# Patient Record
Sex: Male | Born: 1935 | Marital: Married | Smoking: Never smoker
Health system: Southern US, Community
[De-identification: ages and names within clinical notes are randomized; demographics above are authoritative.]

## PROBLEM LIST (undated history)

## (undated) DIAGNOSIS — I10 Essential (primary) hypertension: Secondary | ICD-10-CM

## (undated) DIAGNOSIS — N200 Calculus of kidney: Secondary | ICD-10-CM

## (undated) HISTORY — PX: CARDIAC SURGERY: SHX584

---

## 2013-04-30 ENCOUNTER — Emergency Department: Payer: Self-pay | Admitting: Emergency Medicine

## 2013-04-30 LAB — URINALYSIS, COMPLETE
Bacteria: NONE SEEN
Bilirubin,UR: NEGATIVE
Glucose,UR: NEGATIVE mg/dL (ref 0–75)
Ketone: NEGATIVE
Nitrite: NEGATIVE
Ph: 5 (ref 4.5–8.0)
Protein: NEGATIVE
RBC,UR: 5 /HPF (ref 0–5)

## 2013-04-30 LAB — CBC
MCH: 29.6 pg (ref 26.0–34.0)
MCHC: 34.6 g/dL (ref 32.0–36.0)
Platelet: 389 10*3/uL (ref 150–440)
RBC: 5.28 10*6/uL (ref 4.40–5.90)

## 2013-04-30 LAB — COMPREHENSIVE METABOLIC PANEL
BUN: 12 mg/dL (ref 7–18)
Bilirubin,Total: 1.2 mg/dL — ABNORMAL HIGH (ref 0.2–1.0)
Calcium, Total: 9.6 mg/dL (ref 8.5–10.1)
Chloride: 101 mmol/L (ref 98–107)
Co2: 30 mmol/L (ref 21–32)
Creatinine: 0.89 mg/dL (ref 0.60–1.30)
EGFR (Non-African Amer.): >60
Glucose: 97 mg/dL (ref 65–99)
Osmolality: 272 (ref 275–301)
Potassium: 4 mmol/L (ref 3.5–5.1)
SGOT(AST): 28 U/L (ref 15–37)
SGPT (ALT): 27 U/L (ref 12–78)
Sodium: 136 mmol/L (ref 136–145)
Total Protein: 7.9 g/dL (ref 6.4–8.2)

## 2013-04-30 LAB — TROPONIN I: Troponin-I: <0.02 ng/mL

## 2013-04-30 LAB — CK TOTAL AND CKMB (NOT AT ARMC): CK-MB: <0.5 ng/mL — ABNORMAL LOW (ref 0.5–3.6)

## 2013-05-01 LAB — TROPONIN I: Troponin-I: <0.02 ng/mL

## 2013-05-01 IMAGING — CR DG CHEST 2V
1 series · 2 of 2 positions shown · non-contrast
Comparison: none

REASON FOR EXAM: chest pain eval
COMMENTS:

PROCEDURE:     DXR - DXR CHEST PA (OR AP) AND LATERAL  - [DATE]  [DATE]
RESULT:

[Series 1: w chest pa · 0.14mm/px · 2 of 2 slices shown]
[im 1/2]
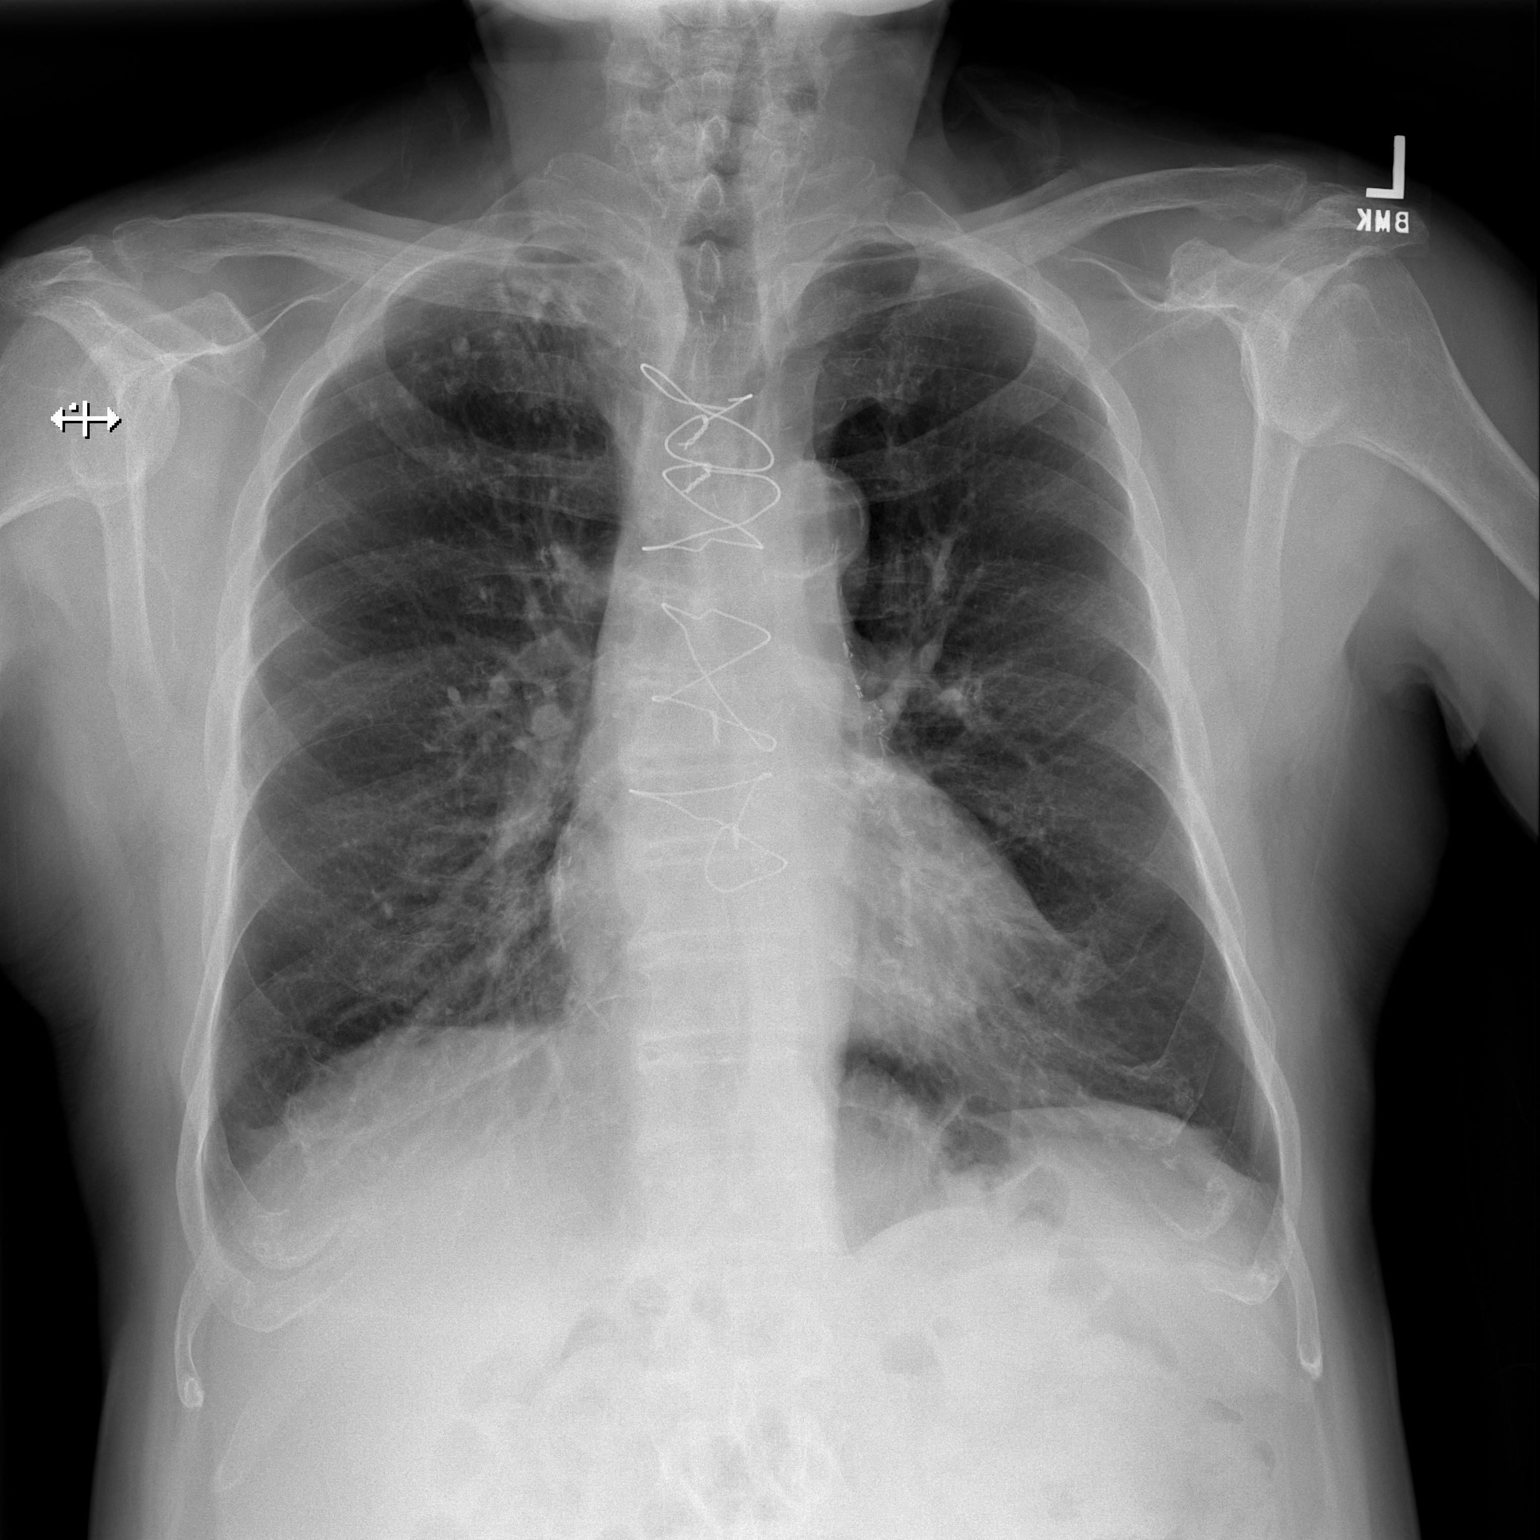
[im 2/2]
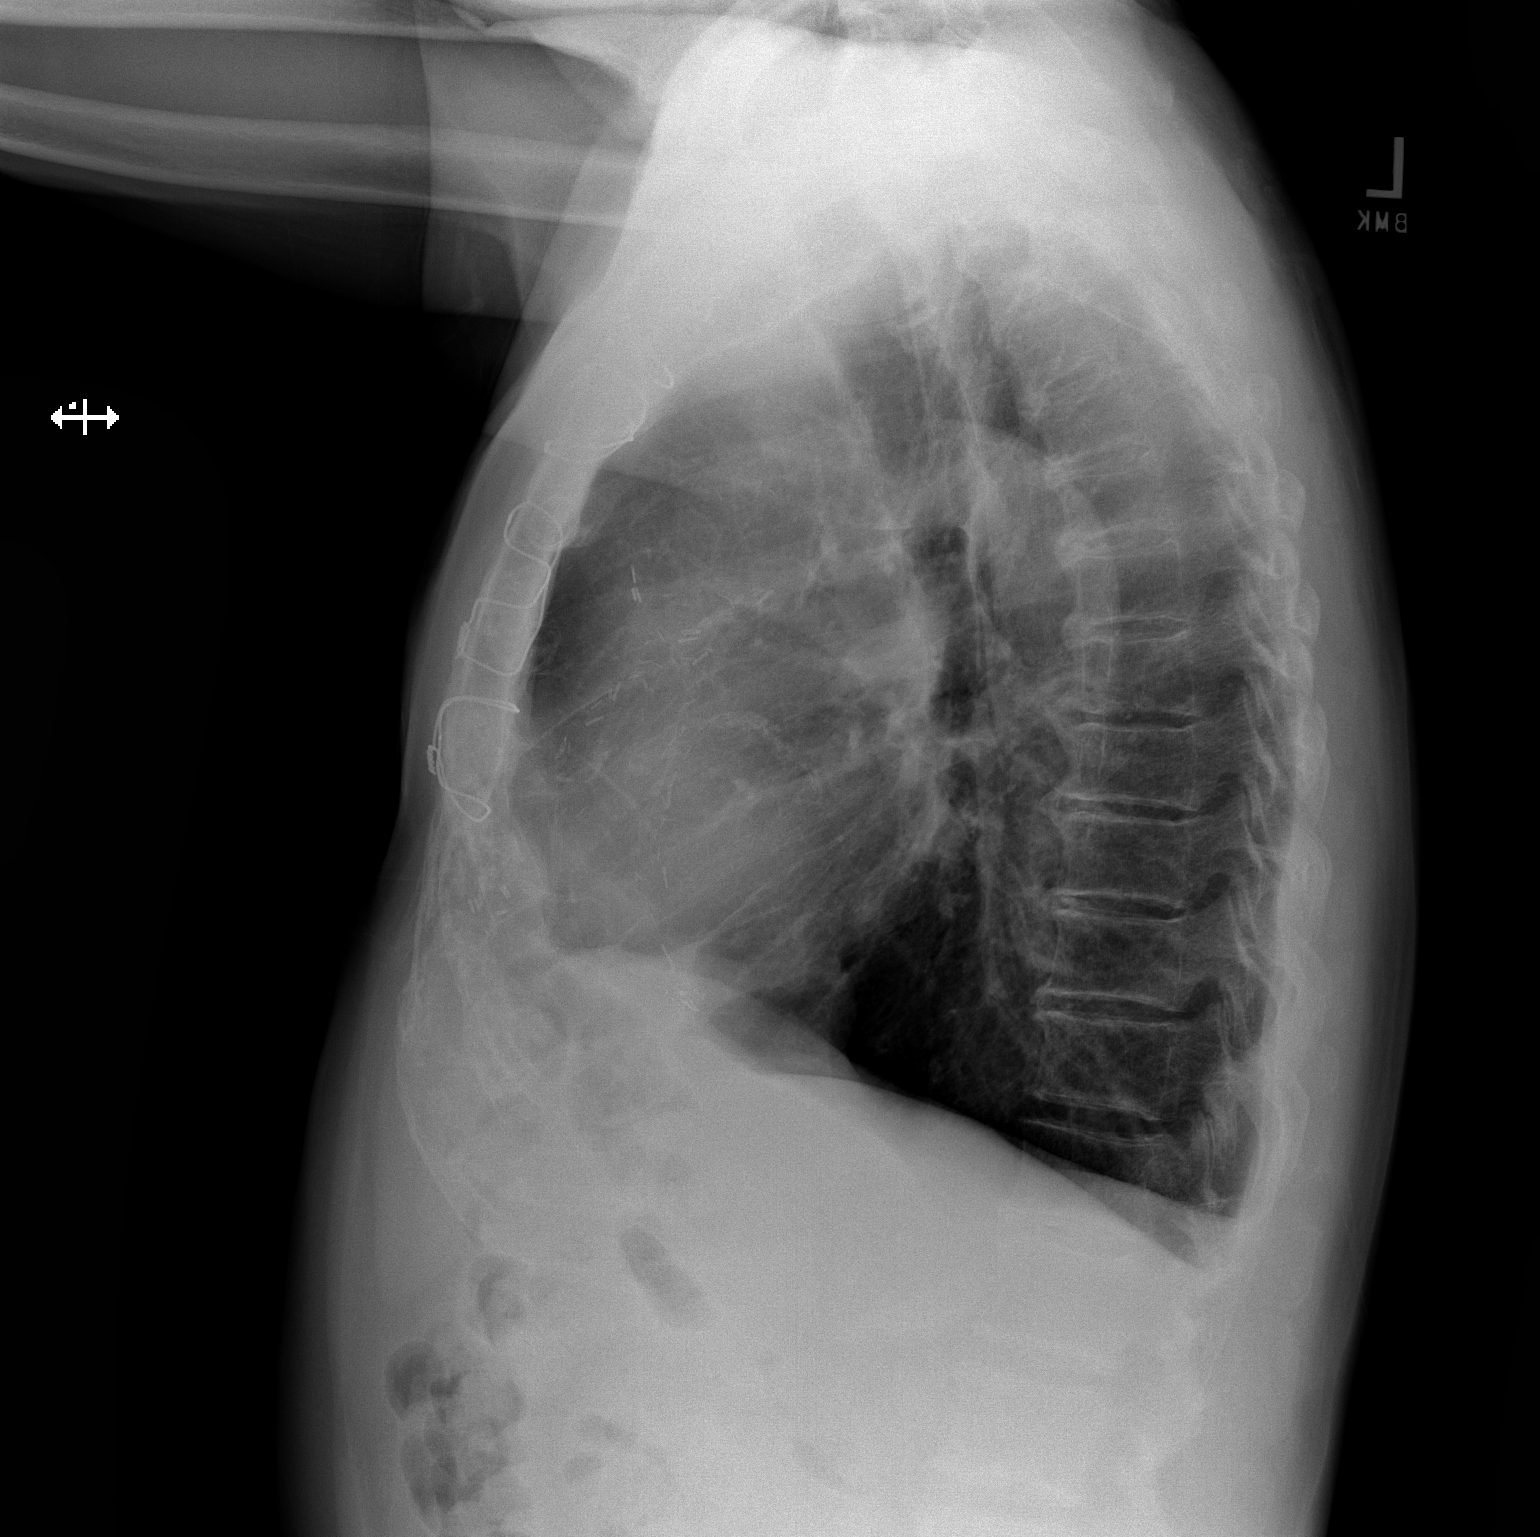

[2 of 2 positions shown; findings below may reference images not displayed]

FINDINGS: There is flattening of the hemidiaphragms. Prominence of the
interstitial marking is appreciated. No focal regions of consolidation are
identified. There is blunting of the costophrenic angles. The cardiac
silhouette is within normal limits. The visualized bony skeleton is
unremarkable. The patient is status post median sternotomy.
IMPRESSION: 1. COPD without evidence of acute cardiopulmonary disease.

## 2017-08-20 ENCOUNTER — Emergency Department
Admission: EM | Admit: 2017-08-20 | Discharge: 2017-08-20 | Disposition: A | Discharge status: Home / Self Care | Payer: Medicare Other | Attending: Emergency Medicine | Admitting: Emergency Medicine

## 2017-08-20 ENCOUNTER — Other Ambulatory Visit: Payer: Self-pay

## 2017-08-20 ENCOUNTER — Encounter: Payer: Self-pay | Admitting: Emergency Medicine

## 2017-08-20 ENCOUNTER — Emergency Department: Payer: Medicare Other

## 2017-08-20 DIAGNOSIS — R109 Unspecified abdominal pain: Secondary | ICD-10-CM | POA: Insufficient documentation

## 2017-08-20 DIAGNOSIS — I10 Essential (primary) hypertension: Secondary | ICD-10-CM | POA: Insufficient documentation

## 2017-08-20 HISTORY — DX: Essential (primary) hypertension: I10

## 2017-08-20 HISTORY — DX: Calculus of kidney: N20.0

## 2017-08-20 LAB — CBC
HEMATOCRIT: 40 % (ref 40.0–52.0)
Hemoglobin: 14.1 g/dL (ref 13.0–18.0)
MCH: 30 pg (ref 26.0–34.0)
MCHC: 35.2 g/dL (ref 32.0–36.0)
MCV: 85.3 fL (ref 80.0–100.0)
PLATELETS: 624 10*3/uL — AB (ref 150–440)
RBC: 4.68 MIL/uL (ref 4.40–5.90)
RDW: 19.6 % — ABNORMAL HIGH (ref 11.5–14.5)
WBC: 8.1 10*3/uL (ref 3.8–10.6)

## 2017-08-20 LAB — BASIC METABOLIC PANEL
Anion gap: 8 (ref 5–15)
BUN: 17 mg/dL (ref 6–20)
CHLORIDE: 101 mmol/L (ref 101–111)
CO2: 29 mmol/L (ref 22–32)
Calcium: 9.2 mg/dL (ref 8.9–10.3)
Creatinine, Ser: 1 mg/dL (ref 0.61–1.24)
Glucose, Bld: 103 mg/dL — ABNORMAL HIGH (ref 65–99)
POTASSIUM: 4.6 mmol/L (ref 3.5–5.1)
SODIUM: 138 mmol/L (ref 135–145)

## 2017-08-20 LAB — URINALYSIS, COMPLETE (UACMP) WITH MICROSCOPIC
Bacteria, UA: NONE SEEN
Bilirubin Urine: NEGATIVE
Glucose, UA: NEGATIVE mg/dL
Hgb urine dipstick: NEGATIVE
Ketones, ur: NEGATIVE mg/dL
Leukocytes, UA: NEGATIVE
Nitrite: NEGATIVE
Protein, ur: NEGATIVE mg/dL
SPECIFIC GRAVITY, URINE: 1.012 (ref 1.005–1.030)
SQUAMOUS EPITHELIAL / LPF: NONE SEEN
pH: 5 (ref 5.0–8.0)

## 2017-08-20 IMAGING — CT CT RENAL STONE PROTOCOL
2 of 4 series · 16 of 46 positions shown, 18 images · non-contrast
Comparison: None.

CLINICAL DATA: 87-year-old male with history of right flank pain

EXAM:
CT ABDOMEN AND PELVIS WITHOUT CONTRAST
TECHNIQUE: Multidetector CT imaging of the abdomen and pelvis was performed
following the standard protocol without IV contrast.

[Series 4: coronal · coronal · 0.74mm/px · 3 of 137 slices shown]
[im 46/137  soft-tissue]
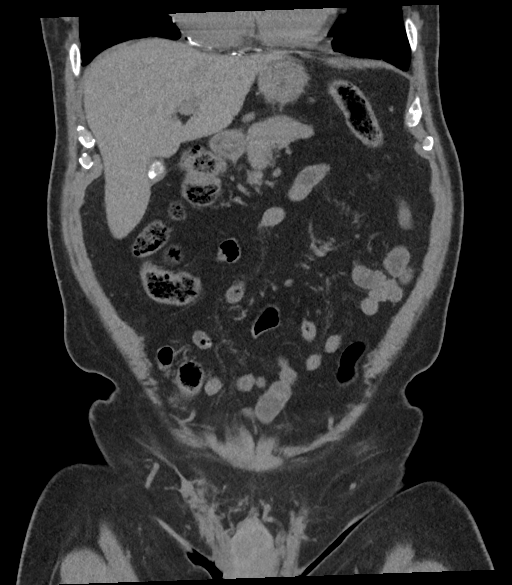
[im 61/137  soft-tissue]
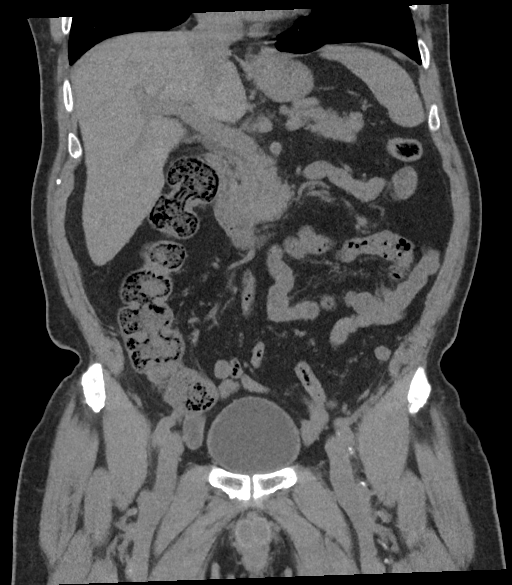
[im 76/137  soft-tissue]
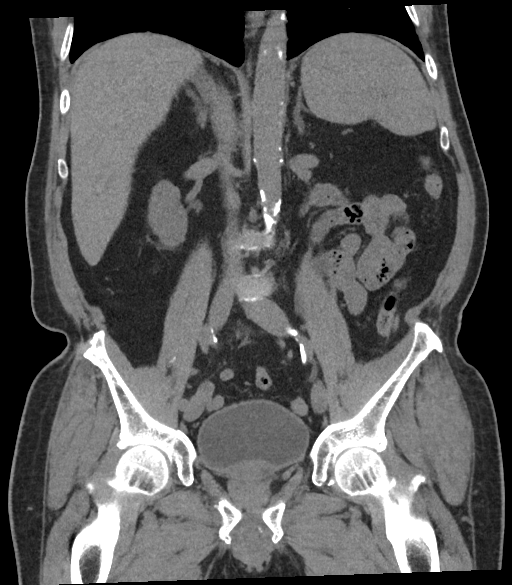

[Series 6: stone full standard · axial · 0.75mm/px · z∈[-916,-551]mm · 13 of 84 slices shown, 15 images]
[im 7/84  soft-tissue]
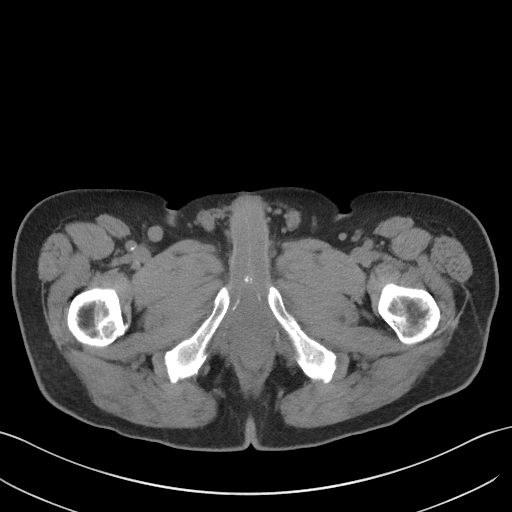
[im 7/84  bone]
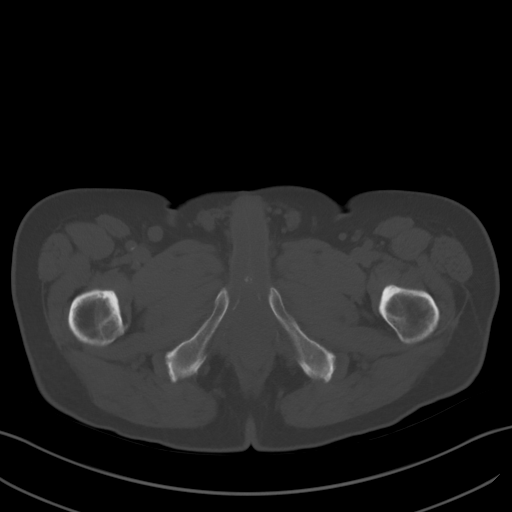
[im 13/84  soft-tissue]
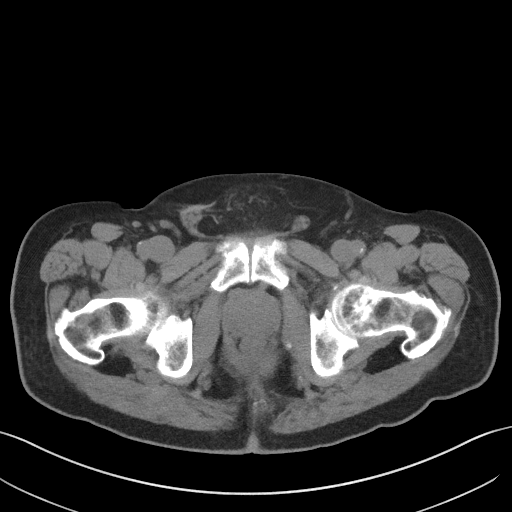
[im 19/84  soft-tissue]
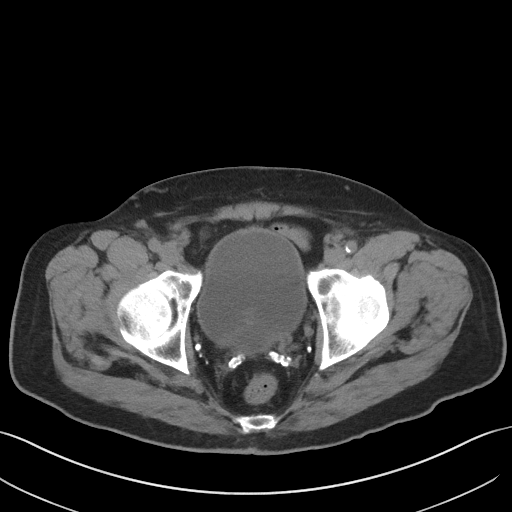
[im 25/84  soft-tissue]
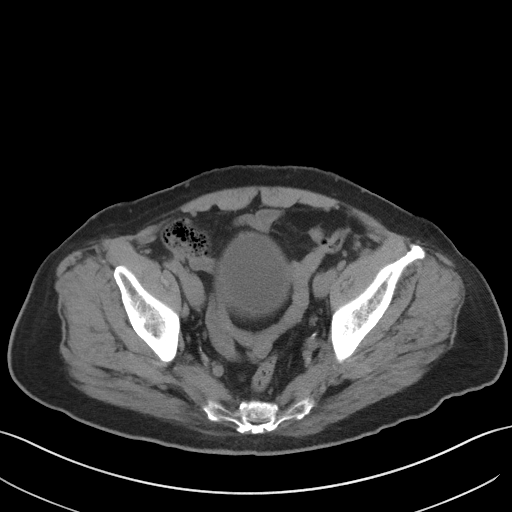
[im 31/84  soft-tissue]
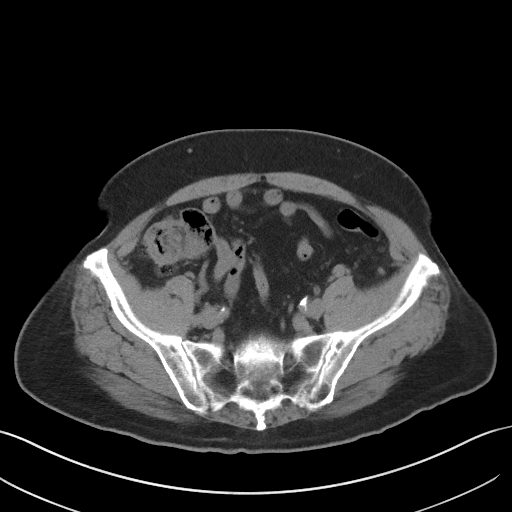
[im 37/84  soft-tissue]
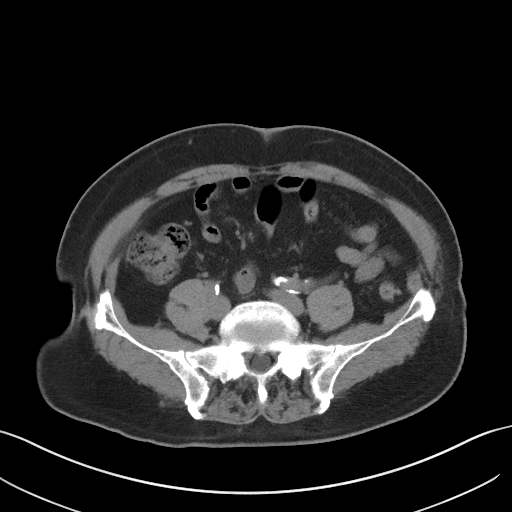
[im 44/84  soft-tissue]
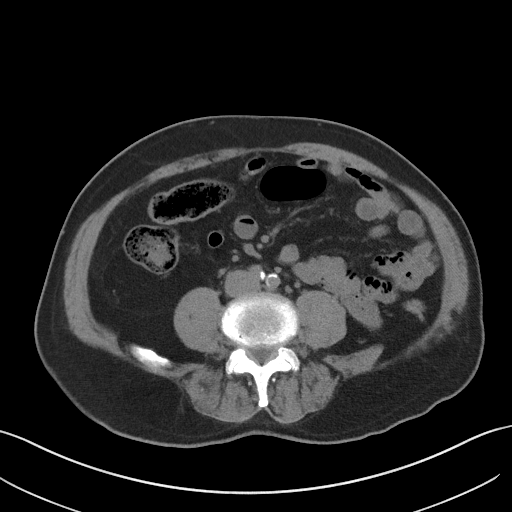
[im 50/84  soft-tissue]
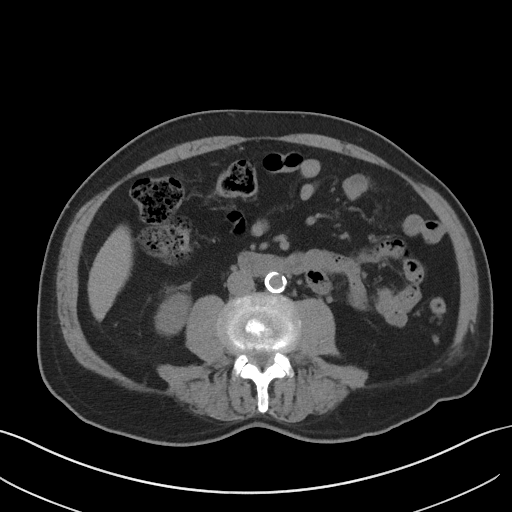
[im 56/84  soft-tissue]
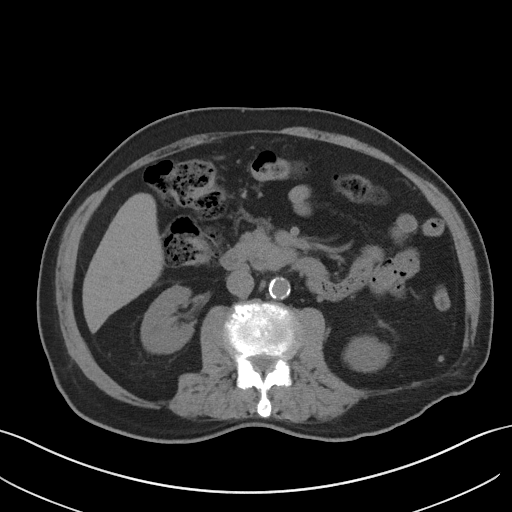
[im 56/84  bone]
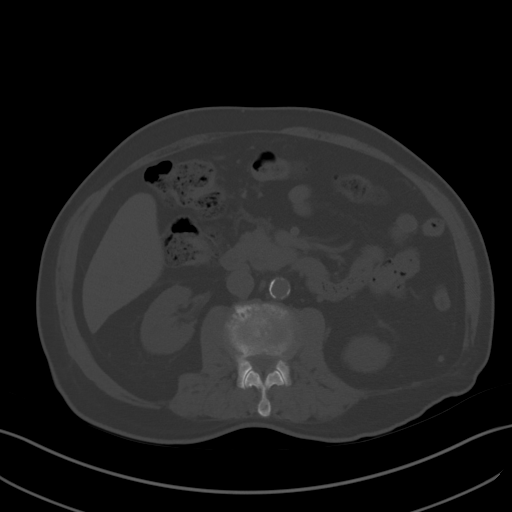
[im 62/84  soft-tissue]
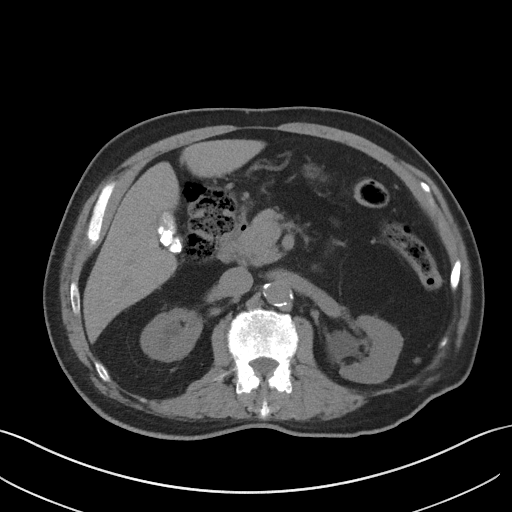
[im 68/84  soft-tissue]
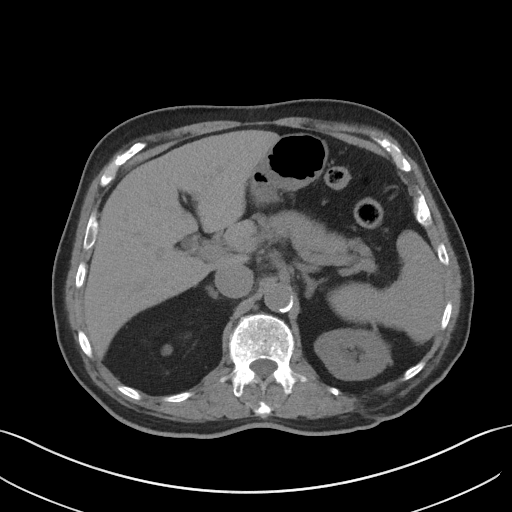
[im 74/84  soft-tissue]
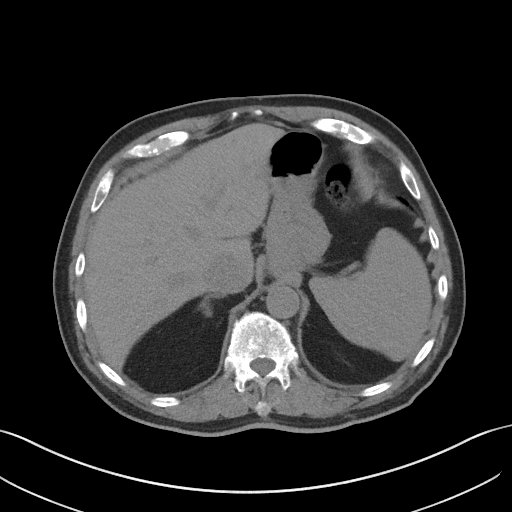
[im 80/84  soft-tissue]
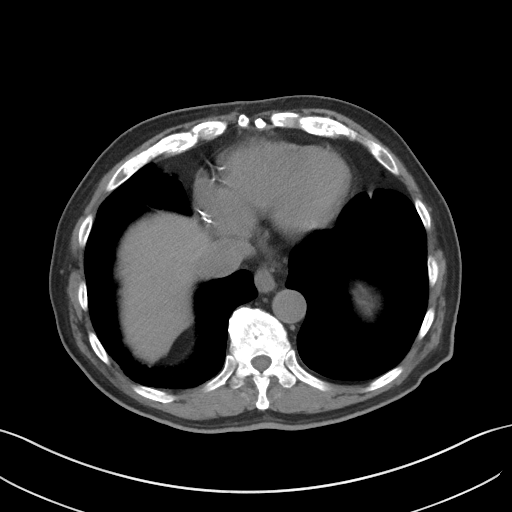

[16 of 46 positions shown; findings below may reference images not displayed]

FINDINGS: Lower chest: Surgical changes of prior median sternotomy.
Calcifications of the native coronary vasculature. Hiatal hernia. No
airspace disease of the lower lungs.

Hepatobiliary: Unremarkable appearance of liver parenchyma.

Cholelithiasis without associated inflammatory changes.

Pancreas: Unremarkable pancreas

Spleen: Unremarkable spleen

Adrenals/Urinary Tract: Unremarkable adrenal glands.

No evidence of right-sided hydronephrosis. Unremarkable course the
right ureter. Mixed density rounded lesion at the lateral cortex of
the right kidney with rim calcification measures 16 mm, incompletely
characterized. Low-density cystic lesions at the superior cortex of
the right kidney, incompletely characterized. No nephrolithiasis.

Left kidney demonstrates extrarenal pelvis. Unremarkable course of
the left ureter. No hydronephrosis or nephrolithiasis.

Urinary bladder partially distended.  No inflammatory changes.

Stomach/Bowel: Hiatal hernia. Unremarkable appearance of stomach.
Unremarkable small bowel with no abnormal distention. No transition
point.

Normal appendix. Mild a moderate stool burden. No inflammatory
changes involving the colon. Diverticular disease without evidence
of acute diverticulitis.

Vascular/Lymphatic: Calcifications of the abdominal aorta. No
aneurysm. Calcifications extend of bilateral iliac system.

No adenopathy.  No inflammatory changes within the abdomen.

Reproductive: Transverse diameter of the prostate measures 5.2 cm

Other: None

Musculoskeletal: No acute displaced fracture. Degenerative changes
of the lumbar spine. No bony canal narrowing.
IMPRESSION: No acute CT finding.

No evidence of hydronephrosis or nephrolithiasis.

Rounded lesion of the lateral cortex of the right kidney with rim
calcification. Additional low-density lesions at the superior cortex
of the right kidney. Although likely benign, none of these are
completely characterize, and consider referral for contrast-enhanced
MR, particularly if the patient has hematuria.

Cholelithiasis without evidence of acute cholecystitis.

Hiatal hernia.

Diverticular disease without evidence of acute diverticulitis.

Aortic vascular calcifications with evidence of native coronary
artery disease, and evidence of prior CABG. Aortic Atherosclerosis
([OL]-[OL]).

## 2017-08-20 MED ORDER — TRAMADOL HCL 50 MG PO TABS
50.0000 mg | ORAL_TABLET | Freq: Once | ORAL | Status: AC
  Administered 2017-08-20: 50 mg via ORAL
  Filled 2017-08-20: qty 1

## 2017-08-20 NOTE — ED Triage Notes (Addendum)
Pt here speaks minimal English but able to understand most questions asked in triage. Pt here with son from home, c/o right flank pain for the past few days, does not c/o pain with urination, has hx of kidneys stones. Pt states the pain feels the same. Pt took pain medication from [place] this morning. Pt states he had surgery about 40 years ago in [place] for kidney stones.  Denies any abdominal pain.

## 2017-08-20 NOTE — ED Notes (Addendum)
Pre reports right sided flank pain X 2 days. Worse with movement. Pt denies any changes in urination, abdominal pain, SOB or chest pain. No recent injuries or any strenuous activity. Hx of kidney stone a "long time ago". Pt alert and oriented X4, active, cooperative, pt in NAD. RR even and unlabored, color WNL.  Son at bedside.

## 2017-08-20 NOTE — ED Notes (Signed)
Pt alert and oriented X4, active, cooperative, pt in NAD. RR even and unlabored, color WNL.  Pt informed to return if any life threatening symptoms occur.  Discharge and followup instructions reviewed.  

## 2017-08-20 NOTE — ED Notes (Signed)
Pt back from CT

## 2017-08-20 NOTE — ED Provider Notes (Signed)
[facility] Emergency Department Provider Note  ____________________________________________  Time seen: Approximately 11:41 AM  I have reviewed the triage vital signs and the nursing notes.   HISTORY  Chief Complaint Flank Pain    HPI Clinton Gordon is a 84 y.o. male who complains of right flank pain for the past 2 days, nonradiating. Worse with movement, no alleviating factors. No dysuria frequency urgency or hematuria. No abdominal pain, no vomiting diarrhea constipation chest pain or shortness of breath. No fevers chills or sweats. Reminds him of the kidney stone he had on his left side many decades ago that required surgical intervention back in [place]. Pain is been constant, moderate intensity.     Past Medical History:  Diagnosis Date  . Hypertension   . Kidney stones      There are no active problems to display for this patient.    History reviewed. No pertinent surgical history.   Prior to Admission medications   Not on File     Allergies Patient has no known allergies.   History reviewed. No pertinent family history.  Social History Social History   Tobacco Use  . Smoking status: Never Smoker  . Smokeless tobacco: Never Used  Substance Use Topics  . Alcohol use: No    Frequency: Never  . Drug use: Not on file    Review of Systems  Constitutional:   No fever or chills.  ENT:   No sore throat. No rhinorrhea. Cardiovascular:   No chest pain or syncope. Respiratory:   No dyspnea or cough. Gastrointestinal:   Negative for abdominal pain, vomiting and diarrhea.  Musculoskeletal:   Right lower back pain as above All other systems reviewed and are negative except as documented above in ROS and HPI.  ____________________________________________   PHYSICAL EXAM:  VITAL SIGNS: ED Triage Vitals  Enc Vitals Group     BP 08/20/17 0742 134/73     Pulse Rate 08/20/17 0742 68     Resp 08/20/17 0742 16     Temp 08/20/17  0742 98 F (36.7 C)     Temp Source 08/20/17 0742 Oral     SpO2 08/20/17 0742 98 %     Weight 08/20/17 0742 145 lb (65.8 kg)     Height 08/20/17 0742 5\' 7"  (1.702 m)     Head Circumference --      Peak Flow --      Pain Score 08/20/17 0747 8     Pain Loc --      Pain Edu? --      Excl. in [place]? --     Vital signs reviewed, nursing assessments reviewed.   Constitutional:   Alert and oriented. Well appearing and in no distress. Eyes:   No scleral icterus.  EOMI. No nystagmus. No conjunctival pallor. PERRL. ENT   Head:   Normocephalic and atraumatic.   Nose:   No congestion/rhinnorhea.    Mouth/Throat:   MMM, no pharyngeal erythema. No peritonsillar mass.    Neck:   No meningismus. Full ROM. Hematological/Lymphatic/Immunilogical:   No cervical lymphadenopathy. Cardiovascular:   RRR. Symmetric bilateral radial and DP pulses.  No murmurs.  Respiratory:   Normal respiratory effort without tachypnea/retractions. Breath sounds are clear and equal bilaterally. No wheezes/rales/rhonchi. Gastrointestinal:   Soft and nontender. Non distended. There is no CVA tenderness.  No rebound, rigidity, or guarding. Genitourinary:   deferred Musculoskeletal:   Normal range of motion in all extremities. No joint effusions.  No lower extremity  tenderness.  No edema. No lower back tenderness. No midline spinal tenderness. Neurologic:   Normal speech and language.  Motor grossly intact. No gross focal neurologic deficits are appreciated.  Skin:    Skin is warm, dry and intact. No rash noted.  No petechiae, purpura, or bullae.  ____________________________________________    LABS (pertinent positives/negatives) (all labs ordered are listed, but only abnormal results are displayed) Labs Reviewed  URINALYSIS, COMPLETE (UACMP) WITH MICROSCOPIC - Abnormal; Notable for the following components:      Result Value   Color, Urine YELLOW (*)    APPearance CLEAR (*)    All other components within  normal limits  BASIC METABOLIC PANEL - Abnormal; Notable for the following components:   Glucose, Bld 103 (*)    All other components within normal limits  CBC - Abnormal; Notable for the following components:   RDW 19.6 (*)    Platelets 624 (*)    All other components within normal limits   ____________________________________________   EKG    ____________________________________________    RADIOLOGY  Ct Renal Stone Study  Result Date: 08/20/2017 CLINICAL DATA:  84 year old male with history of right flank pain EXAM: CT ABDOMEN AND PELVIS WITHOUT CONTRAST TECHNIQUE: Multidetector CT imaging of the abdomen and pelvis was performed following the standard protocol without IV contrast. COMPARISON:  None. FINDINGS: Lower chest: Surgical changes of prior median sternotomy. Calcifications of the native coronary vasculature. Hiatal hernia. No airspace disease of the lower lungs. Hepatobiliary: Unremarkable appearance of liver parenchyma. Cholelithiasis without associated inflammatory changes. Pancreas: Unremarkable pancreas Spleen: Unremarkable spleen Adrenals/Urinary Tract: Unremarkable adrenal glands. No evidence of right-sided hydronephrosis. Unremarkable course the right ureter. Mixed density rounded lesion at the lateral cortex of the right kidney with rim calcification measures 16 mm, incompletely characterized. Low-density cystic lesions at the superior cortex of the right kidney, incompletely characterized. No nephrolithiasis. Left kidney demonstrates extrarenal pelvis. Unremarkable course of the left ureter. No hydronephrosis or nephrolithiasis. Urinary bladder partially distended.  No inflammatory changes. Stomach/Bowel: Hiatal hernia. Unremarkable appearance of stomach. Unremarkable small bowel with no abnormal distention. No transition point. Normal appendix. Mild a moderate stool burden. No inflammatory changes involving the colon. Diverticular disease without evidence of acute  diverticulitis. Vascular/Lymphatic: Calcifications of the abdominal aorta. No aneurysm. Calcifications extend of bilateral iliac system. No adenopathy.  No inflammatory changes within the abdomen. Reproductive: Transverse diameter of the prostate measures 5.2 cm Other: None Musculoskeletal: No acute displaced fracture. Degenerative changes of the lumbar spine. No bony canal narrowing. IMPRESSION: No acute CT finding. No evidence of hydronephrosis or nephrolithiasis. Rounded lesion of the lateral cortex of the right kidney with rim calcification. Additional low-density lesions at the superior cortex of the right kidney. Although likely benign, none of these are completely characterize, and consider referral for contrast-enhanced MR, particularly if the patient has hematuria. Cholelithiasis without evidence of acute cholecystitis. Hiatal hernia. Diverticular disease without evidence of acute diverticulitis. Aortic vascular calcifications with evidence of native coronary artery disease, and evidence of prior CABG. Aortic Atherosclerosis (ICD10-I70.0). Electronically Signed   By: Corrie Mckusick D.O.   On: 08/20/2017 09:53    ____________________________________________   PROCEDURES Procedures  ____________________________________________   DIFFERENTIAL DIAGNOSIS  Ureterolithiasis, cystitis, appendicitis, bowel obstruction  CLINICAL IMPRESSION / ASSESSMENT AND PLAN / ED COURSE  Pertinent labs & imaging results that were available during my care of the patient were reviewed by me and considered in my medical decision making (see chart for details).   Patient well-appearing no acute  distress, unremarkable vital signs, overall reassuring exam and due to his age there is an elevated risk of occult pathology. Urinalysis was totally normal. Serum labs normal. No evidence of urinary tract infection.  Platelets are somewhat elevated, suspicious for an inflammatory state although his white blood cell count is  normal. Proceeded with CT scan to evaluate for kidney stone versus abdominal pathology, imaging was unremarkable without evidence of ureteral obstruction or appendicitis. Low suspicion for any vascular pathology. On reassessment patient feels better and wishes to proceed with outpatient follow-up with his primary care doctor. Recommended ibuprofen and Tylenol as needed for pain as well as a heating pad and continue symptom monitoring. Return precautions for any worsening of condition.Considering the patient's symptoms, medical history, and physical examination today, I have low suspicion for cholecystitis or biliary pathology, pancreatitis, perforation or bowel obstruction, hernia, intra-abdominal abscess, AAA or dissection, volvulus or intussusception, mesenteric ischemia, or appendicitis.        ____________________________________________   FINAL CLINICAL IMPRESSION(S) / ED DIAGNOSES    Final diagnoses:  Right flank pain      This SmartLink is deprecated. Use AVSMEDLIST instead to display the medication list for a patient.   Portions of this note were generated with dragon dictation software. Dictation errors may occur despite best attempts at proofreading.    Carrie Mew, MD 08/20/17 1143

## 2017-08-20 NOTE — ED Notes (Signed)
Patient transported to CT 

## 2017-08-20 NOTE — Discharge Instructions (Signed)
Your blood tests, urine test, and CT scan of the abdomen do not show any acute issues. Follow up with your doctor for continued monitoring of your symptoms.

## 2019-07-19 ENCOUNTER — Other Ambulatory Visit: Payer: Self-pay

## 2019-07-19 DIAGNOSIS — Z20822 Contact with and (suspected) exposure to covid-19: Secondary | ICD-10-CM

## 2019-07-20 LAB — NOVEL CORONAVIRUS, NAA: SARS-CoV-2, NAA: NOT DETECTED

## 2020-01-10 ENCOUNTER — Encounter: Payer: Self-pay | Admitting: Internal Medicine

## 2020-01-10 ENCOUNTER — Inpatient Hospital Stay: Payer: Medicare Other | Attending: Internal Medicine | Admitting: Internal Medicine

## 2020-01-10 ENCOUNTER — Other Ambulatory Visit: Payer: Self-pay

## 2020-01-10 ENCOUNTER — Inpatient Hospital Stay: Payer: Medicare Other

## 2020-01-10 DIAGNOSIS — Z7982 Long term (current) use of aspirin: Secondary | ICD-10-CM | POA: Insufficient documentation

## 2020-01-10 DIAGNOSIS — I10 Essential (primary) hypertension: Secondary | ICD-10-CM | POA: Insufficient documentation

## 2020-01-10 DIAGNOSIS — Z87442 Personal history of urinary calculi: Secondary | ICD-10-CM | POA: Insufficient documentation

## 2020-01-10 DIAGNOSIS — D473 Essential (hemorrhagic) thrombocythemia: Secondary | ICD-10-CM | POA: Insufficient documentation

## 2020-01-10 DIAGNOSIS — D75839 Thrombocytosis, unspecified: Secondary | ICD-10-CM

## 2020-01-10 DIAGNOSIS — Z79899 Other long term (current) drug therapy: Secondary | ICD-10-CM | POA: Diagnosis not present

## 2020-01-10 LAB — CBC WITH DIFFERENTIAL/PLATELET
Abs Immature Granulocytes: 0.14 10*3/uL — ABNORMAL HIGH (ref 0.00–0.07)
Basophils Absolute: 0.1 10*3/uL (ref 0.0–0.1)
Basophils Relative: 1 %
Eosinophils Absolute: 0.3 10*3/uL (ref 0.0–0.5)
Eosinophils Relative: 3 %
HCT: 38 % — ABNORMAL LOW (ref 39.0–52.0)
Hemoglobin: 12.2 g/dL — ABNORMAL LOW (ref 13.0–17.0)
Immature Granulocytes: 1 %
Lymphocytes Relative: 11 %
Lymphs Abs: 1.1 10*3/uL (ref 0.7–4.0)
MCH: 29 pg (ref 26.0–34.0)
MCHC: 32.1 g/dL (ref 30.0–36.0)
MCV: 90.3 fL (ref 80.0–100.0)
Monocytes Absolute: 1.4 10*3/uL — ABNORMAL HIGH (ref 0.1–1.0)
Monocytes Relative: 14 %
Neutro Abs: 7 10*3/uL (ref 1.7–7.7)
Neutrophils Relative %: 70 %
Platelets: 651 10*3/uL — ABNORMAL HIGH (ref 150–400)
RBC: 4.21 MIL/uL — ABNORMAL LOW (ref 4.22–5.81)
RDW: 18.1 % — ABNORMAL HIGH (ref 11.5–15.5)
WBC: 10 10*3/uL (ref 4.0–10.5)
nRBC: 0.7 % — ABNORMAL HIGH (ref 0.0–0.2)

## 2020-01-10 LAB — LACTATE DEHYDROGENASE: LDH: 418 U/L — ABNORMAL HIGH (ref 98–192)

## 2020-01-10 NOTE — Assessment & Plan Note (Addendum)
#  Thrombocytosis-platelets 600-700; range since 2017; fairly normal hemoglobin; white count.  Suspect primary bone marrow process like essential thrombocytosis; clinically less likely chronic myeloid leukemia.   #Patient is currently asymptomatic; however patient will be high risk based on patient age-would recommend Hydrea if diagnosed with essential thrombocytosis.  Also discussed the possible need for bone marrow biopsy if above work-up is inconclusive.  #Recommend checking; CBC LDH; MPL; CAL R; JAK2 mutations; review of peripheral smear.  Check BCR ABL FISH.  Thank you Dr.Hande for allowing me to participate in the care of your pleasant patient. Please do not hesitate to contact me with questions or concerns in the interim.  # DISPOSITION: # labs today- ordered- # follow up in ~ 2 weeks- MD;- No labs- Dr.B

## 2020-01-10 NOTE — Progress Notes (Signed)
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

## 2020-01-10 NOTE — Progress Notes (Addendum)
[name]  Patient Care Team: Tracie Harrier, MD as PCP - General (Internal Medicine)  CHIEF COMPLAINTS/PURPOSE OF CONSULTATION: Thrombocytosis  HEMATOLOGY HISTORY   # THROMBOCYTOSIS [platelets-600-700 since 2017 ; Hb-13; white count- 12]; 2019- CT-NEG for spleneomegaly.   # HTN; CAD- S/p CABG 2003; BPH    HISTORY OF PRESENTING ILLNESS:  Clinton Gordon 86 y.o.  male pleasant patient was been referred to [place] for further evaluation of elevated platelets which was incidentally found on blood work.   Patient denies any tingling or numbness in extremities.  Denies any burning sensation hand and feet.  Patient denies any history of blood clots or strokes. Denies any burning pain or discoloration in the fingertips or toes.   Appetite is good and weight loss or night sweats no recurrent fevers. No cough or shortness of breath or chest pain.     Review of Systems  Constitutional: Negative for chills, diaphoresis, fever, malaise/fatigue and weight loss.  HENT: Negative for nosebleeds and sore throat.   Eyes: Negative for double vision.  Respiratory: Negative for cough, hemoptysis, sputum production, shortness of breath and wheezing.   Cardiovascular: Negative for chest pain, palpitations, orthopnea and leg swelling.  Gastrointestinal: Negative for abdominal pain, blood in stool, constipation, diarrhea, heartburn, melena, nausea and vomiting.  Genitourinary: Negative for dysuria, frequency and urgency.  Musculoskeletal: Negative for back pain and joint pain.  Skin: Negative.  Negative for itching and rash.  Neurological: Negative for dizziness, tingling, focal weakness, weakness and headaches.  Endo/Heme/Allergies: Does not bruise/bleed easily.  Psychiatric/Behavioral: Negative for depression. The patient is not nervous/anxious and does not have insomnia.      MEDICAL HISTORY:  Past Medical History:  Diagnosis Date  . Hypertension   . Kidney stones      SURGICAL HISTORY: History reviewed. No pertinent surgical history.  SOCIAL HISTORY: Social History   Socioeconomic History  . Marital status: Married    Spouse name: Not on file  . Number of children: Not on file  . Years of education: Not on file  . Highest education level: Not on file  Occupational History  . Not on file  Tobacco Use  . Smoking status: Never Smoker  . Smokeless tobacco: Never Used  Substance and Sexual Activity  . Alcohol use: No  . Drug use: Not on file  . Sexual activity: Not on file  Other Topics Concern  . Not on file  Social History Narrative   Retd. 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    Social Determinants of Health   Financial Resource Strain:   . Difficulty of Paying Living Expenses:   Food Insecurity:   . Worried About Charity fundraiser in the Last Year:   . Arboriculturist in the Last Year:   Transportation Needs:   . Film/video editor (Medical):   Marland Kitchen Lack of Transportation (Non-Medical):   Physical Activity:   . Days of Exercise per Week:   . Minutes of Exercise per Session:   Stress:   . Feeling of Stress :   Social Connections:   . Frequency of Communication with Friends and Family:   . Frequency of Social Gatherings with Friends and Family:   . Attends Religious Services:   . Active Member of Clubs or Organizations:   . Attends Archivist Meetings:   Marland Kitchen Marital Status:   Intimate Partner Violence:   . Fear of Current  or Ex-Partner:   . Emotionally Abused:   Marland Kitchen Physically Abused:   . Sexually Abused:     FAMILY HISTORY: History reviewed. No pertinent family history.  ALLERGIES:  has No Known Allergies.  MEDICATIONS:  Current Outpatient Medications  Medication Sig Dispense Refill  . aspirin 81 MG EC tablet Take by mouth.    Marland Kitchen atorvastatin (LIPITOR) 20 MG tablet Take by mouth.    . isosorbide mononitrate (IMDUR) 30 MG 24 hr tablet Take by mouth.    .  losartan (COZAAR) 25 MG tablet Take by mouth.    . metoprolol succinate (TOPROL-XL) 25 MG 24 hr tablet Take by mouth.     No current facility-administered medications for this visit.     PHYSICAL EXAMINATION:   Vitals:   01/10/20 1436  BP: (!) 148/72  Pulse: 76  Resp: 18  Temp: (!) 97 F (36.1 C)  SpO2: 100%   Filed Weights   01/10/20 1436  Weight: 148 lb (67.1 kg)    Physical Exam  Constitutional: He is oriented to person, place, and time and well-developed, well-nourished, and in no distress.  Accompanied by son.  Walking independently.  HENT:  Head: Normocephalic and atraumatic.  Mouth/Throat: Oropharynx is clear and moist. No oropharyngeal exudate.  Eyes: Pupils are equal, round, and reactive to light.  Cardiovascular: Normal rate and regular rhythm.  Pulmonary/Chest: Effort normal and breath sounds normal. No respiratory distress. He has no wheezes.  Abdominal: Soft. Bowel sounds are normal. He exhibits no distension and no mass. There is no abdominal tenderness. There is no rebound and no guarding.  Musculoskeletal:        General: No tenderness or edema. Normal range of motion.     Cervical back: Normal range of motion and neck supple.  Neurological: He is alert and oriented to person, place, and time.  Skin: Skin is warm.  Psychiatric: Affect normal.     LABORATORY DATA:  I have reviewed the data as listed Lab Results  Component Value Date   WBC 10.0 01/10/2020   HGB 12.2 (L) 01/10/2020   HCT 38.0 (L) 01/10/2020   MCV 90.3 01/10/2020   PLT 651 (H) 01/10/2020   No results for input(s): NA, K, CL, CO2, GLUCOSE, BUN, CREATININE, CALCIUM, GFRNONAA, GFRAA, PROT, ALBUMIN, AST, ALT, ALKPHOS, BILITOT, BILIDIR, IBILI in the last 8760 hours.   No results found.  ASSESSMENT & PLAN:   Thrombocytosis ([place]) #Thrombocytosis-platelets 600-700; range since 2017; fairly normal hemoglobin; white count.  Suspect primary bone marrow process like essential  thrombocytosis; clinically less likely chronic myeloid leukemia.   #Patient is currently asymptomatic; however patient will be high risk based on patient age-would recommend Hydrea if diagnosed with essential thrombocytosis.  Also discussed the possible need for bone marrow biopsy if above work-up is inconclusive.  #Recommend checking; CBC LDH; MPL; CAL R; JAK2 mutations; review of peripheral smear.  Check BCR ABL FISH.  Thank you Dr.Hande for allowing me to participate in the care of your pleasant patient. Please do not hesitate to contact me with questions or concerns in the interim.  # DISPOSITION: # labs today- ordered- # follow up in ~ 2 weeks- MD;- No labs- Dr.B    Cammie Sickle, MD 01/11/2020 8:16 AM

## 2020-01-11 LAB — PATHOLOGIST SMEAR REVIEW

## 2020-01-13 LAB — JAK2 GENOTYPR

## 2020-01-14 LAB — BCR-ABL1 FISH
Cells Analyzed: 200
Cells Counted: 200

## 2020-01-14 LAB — MPL MUTATION ANALYSIS

## 2020-01-18 LAB — CALRETICULIN (CALR) MUTATION ANALYSIS

## 2020-01-25 ENCOUNTER — Other Ambulatory Visit: Payer: Self-pay

## 2020-01-25 ENCOUNTER — Encounter: Payer: Self-pay | Admitting: Internal Medicine

## 2020-01-25 ENCOUNTER — Inpatient Hospital Stay: Payer: Medicare Other | Attending: Internal Medicine | Admitting: Internal Medicine

## 2020-01-25 DIAGNOSIS — Z87442 Personal history of urinary calculi: Secondary | ICD-10-CM | POA: Diagnosis not present

## 2020-01-25 DIAGNOSIS — Z79899 Other long term (current) drug therapy: Secondary | ICD-10-CM | POA: Insufficient documentation

## 2020-01-25 DIAGNOSIS — Z7982 Long term (current) use of aspirin: Secondary | ICD-10-CM | POA: Diagnosis not present

## 2020-01-25 DIAGNOSIS — I10 Essential (primary) hypertension: Secondary | ICD-10-CM | POA: Insufficient documentation

## 2020-01-25 DIAGNOSIS — D473 Essential (hemorrhagic) thrombocythemia: Secondary | ICD-10-CM | POA: Insufficient documentation

## 2020-01-25 MED ORDER — HYDROXYUREA 500 MG PO CAPS: 500.0000 mg | ORAL_CAPSULE | Freq: Two times a day (BID) | ORAL | 3 refills | Status: DC

## 2020-01-25 NOTE — Assessment & Plan Note (Addendum)
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  High risk for thrombosis-given age/JAK2 mutation.   #Patient is currently asymptomatic; however patient will be high risk based on patient age; I would recommend Hydrea.  I discussed the possibility of myelofibrosis-however this would need further work-up including a bone marrow biopsy; ultrasound to rule out any splenomegaly.  Clinically based upon patient's lack of significant symptoms; and at least elevated platelets for 3 to 4 years I suspect it is essential thrombocytosis.  Patient declines bone marrow biopsy; which is reasonable at this time.   # Recommend Hydrea to control blood counts. I discussed the potential adverse effects including but not limited to nausea vomiting diarrhea; skin rash/lower extremity ulcerations.  Would recommend close monitoring of blood counts given the risk of bone marrow suppression.  # DISPOSITION: # cbc in 2 weeks- [place]; needs requisition # follow up in 12 th week of july- MD;- cbc/cmp/LDH- Dr.B  Cc; Dr.Hande

## 2020-01-25 NOTE — Progress Notes (Signed)
Pt and son in for follow up and lab results.

## 2020-01-27 NOTE — Progress Notes (Signed)
[place] NOTE  Patient Care Team: Tracie Harrier, MD as PCP - General (Internal Medicine)  CHIEF COMPLAINTS/PURPOSE OF CONSULTATION: Thrombocytosis  HEMATOLOGY HISTORY   Oncology History Overview Note  # ESSENTIAL THROMBOCYTOSIS [platelets-600-700 since 2017 ; Hb-13; white count- 12]; 2019- CT-NEG for splenomegaly; June 2021-JAK-2 positive; Hydrea 500 mg BID  # HTN; CAD- S/p CABG 2003; BPH   Essential thrombocythemia ([place])  01/10/2020 Initial Diagnosis   Essential thrombocythemia ([place])    HISTORY OF PRESENTING ILLNESS:  Clinton Gordon 86 y.o.  male with elevated platelets is here to follow-up with regards to his blood work ordered last visit.  Patient continues to deny any tingling or numbness in extremities.  Denies any burning sensation.   Review of Systems  Constitutional: Negative for chills, diaphoresis, fever, malaise/fatigue and weight loss.  HENT: Negative for nosebleeds and sore throat.   Eyes: Negative for double vision.  Respiratory: Negative for cough, hemoptysis, sputum production, shortness of breath and wheezing.   Cardiovascular: Negative for chest pain, palpitations, orthopnea and leg swelling.  Gastrointestinal: Negative for abdominal pain, blood in stool, constipation, diarrhea, heartburn, melena, nausea and vomiting.  Genitourinary: Negative for dysuria, frequency and urgency.  Musculoskeletal: Negative for back pain and joint pain.  Skin: Negative.  Negative for itching and rash.  Neurological: Negative for dizziness, tingling, focal weakness, weakness and headaches.  Endo/Heme/Allergies: Does not bruise/bleed easily.  Psychiatric/Behavioral: Negative for depression. The patient is not nervous/anxious and does not have insomnia.      MEDICAL HISTORY:  Past Medical History:  Diagnosis Date  . Hypertension   . Kidney stones     SURGICAL HISTORY: History reviewed. No pertinent surgical history.  SOCIAL HISTORY: Social  History   Socioeconomic History  . Marital status: Married    Spouse name: Not on file  . Number of children: Not on file  . Years of education: Not on file  . Highest education level: Not on file  Occupational History  . Not on file  Tobacco Use  . Smoking status: Never Smoker  . Smokeless tobacco: Never Used  Vaping Use  . Vaping Use: Never used  Substance and Sexual Activity  . Alcohol use: No  . Drug use: Not on file  . Sexual activity: Not on file  Other Topics Concern  . Not on file  Social History Narrative   Retd. 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    Social Determinants of Health   Financial Resource Strain:   . Difficulty of Paying Living Expenses:   Food Insecurity:   . Worried About Charity fundraiser in the Last Year:   . Arboriculturist in the Last Year:   Transportation Needs:   . Film/video editor (Medical):   Marland Kitchen Lack of Transportation (Non-Medical):   Physical Activity:   . Days of Exercise per Week:   . Minutes of Exercise per Session:   Stress:   . Feeling of Stress :   Social Connections:   . Frequency of Communication with Friends and Family:   . Frequency of Social Gatherings with Friends and Family:   . Attends Religious Services:   . Active Member of Clubs or Organizations:   . Attends Archivist Meetings:   Marland Kitchen Marital Status:   Intimate Partner Violence:   . Fear of Current or Ex-Partner:   . Emotionally Abused:   Marland Kitchen Physically Abused:   . Sexually Abused:  FAMILY HISTORY: History reviewed. No pertinent family history.  ALLERGIES:  has No Known Allergies.  MEDICATIONS:  Current Outpatient Medications  Medication Sig Dispense Refill  . aspirin 81 MG EC tablet Take by mouth.    Marland Kitchen atorvastatin (LIPITOR) 20 MG tablet Take by mouth.    . isosorbide mononitrate (IMDUR) 30 MG 24 hr tablet Take by mouth.    . losartan (COZAAR) 25 MG tablet Take by mouth.    . metoprolol  succinate (TOPROL-XL) 25 MG 24 hr tablet Take by mouth.    . tamsulosin (FLOMAX) 0.4 MG CAPS capsule Take by mouth.    . hydroxyurea (HYDREA) 500 MG capsule Take 1 capsule (500 mg total) by mouth 2 (two) times daily. May take with food to minimize GI side effects. 60 capsule 3   No current facility-administered medications for this visit.     PHYSICAL EXAMINATION:   Vitals:   01/25/20 1517  BP: 114/70  Pulse: 81  Resp: 18  Temp: (!) 96.3 F (35.7 C)  SpO2: 100%   Filed Weights   01/25/20 1517  Weight: 146 lb 9.6 oz (66.5 kg)    Physical Exam Constitutional:      Comments: Accompanied by son.  Walking independently.  HENT:     Head: Normocephalic and atraumatic.     Mouth/Throat:     Pharynx: No oropharyngeal exudate.  Eyes:     Pupils: Pupils are equal, round, and reactive to light.  Cardiovascular:     Rate and Rhythm: Normal rate and regular rhythm.  Pulmonary:     Effort: Pulmonary effort is normal. No respiratory distress.     Breath sounds: Normal breath sounds. No wheezing.  Abdominal:     General: Bowel sounds are normal. There is no distension.     Palpations: Abdomen is soft. There is no mass.     Tenderness: There is no abdominal tenderness. There is no guarding or rebound.  Musculoskeletal:        General: No tenderness. Normal range of motion.     Cervical back: Normal range of motion and neck supple.  Skin:    General: Skin is warm.  Neurological:     Mental Status: He is alert and oriented to person, place, and time.  Psychiatric:        Mood and Affect: Affect normal.     LABORATORY DATA:  I have reviewed the data as listed Lab Results  Component Value Date   WBC 10.0 01/10/2020   HGB 12.2 (L) 01/10/2020   HCT 38.0 (L) 01/10/2020   MCV 90.3 01/10/2020   PLT 651 (H) 01/10/2020   No results for input(s): NA, K, CL, CO2, GLUCOSE, BUN, CREATININE, CALCIUM, GFRNONAA, GFRAA, PROT, ALBUMIN, AST, ALT, ALKPHOS, BILITOT, BILIDIR, IBILI in the last  8760 hours.   No results found.  ASSESSMENT & PLAN:   Essential thrombocythemia ([place]) # Essential thrombocytosis-JAK-2 positive--platelets 600-700; range since 2017; fairly normal hemoglobin; white count.  High risk for thrombosis-given age/JAK2 mutation.   #Patient is currently asymptomatic; however patient will be high risk based on patient age; I would recommend Hydrea.  I discussed the possibility of myelofibrosis-however this would need further work-up including a bone marrow biopsy; ultrasound to rule out any splenomegaly.  Clinically based upon patient's lack of significant symptoms; and at least elevated platelets for 3 to 4 years I suspect it is essential thrombocytosis.  Patient declines bone marrow biopsy; which is reasonable at this time.   # Recommend Hydrea to  control blood counts. I discussed the potential adverse effects including but not limited to nausea vomiting diarrhea; skin rash/lower extremity ulcerations.  Would recommend close monitoring of blood counts given the risk of bone marrow suppression.  # DISPOSITION: # cbc in 2 weeks- [place]; needs requisition # follow up in 12 th week of july- MD;- cbc/cmp/LDH- Dr.B  Cc; Dr.Hande   Cammie Sickle, MD 01/27/2020 7:39 PM

## 2020-02-25 ENCOUNTER — Other Ambulatory Visit: Payer: Self-pay | Admitting: Licensed Clinical Social Worker

## 2020-02-25 DIAGNOSIS — D473 Essential (hemorrhagic) thrombocythemia: Secondary | ICD-10-CM

## 2020-02-29 ENCOUNTER — Other Ambulatory Visit: Payer: Self-pay

## 2020-02-29 ENCOUNTER — Inpatient Hospital Stay (HOSPITAL_BASED_OUTPATIENT_CLINIC_OR_DEPARTMENT_OTHER): Payer: Medicare Other | Admitting: Internal Medicine

## 2020-02-29 ENCOUNTER — Inpatient Hospital Stay: Payer: Medicare Other | Attending: Internal Medicine

## 2020-02-29 DIAGNOSIS — Z7982 Long term (current) use of aspirin: Secondary | ICD-10-CM | POA: Insufficient documentation

## 2020-02-29 DIAGNOSIS — Z87442 Personal history of urinary calculi: Secondary | ICD-10-CM | POA: Diagnosis not present

## 2020-02-29 DIAGNOSIS — D473 Essential (hemorrhagic) thrombocythemia: Secondary | ICD-10-CM | POA: Diagnosis not present

## 2020-02-29 DIAGNOSIS — Z79899 Other long term (current) drug therapy: Secondary | ICD-10-CM | POA: Insufficient documentation

## 2020-02-29 DIAGNOSIS — I10 Essential (primary) hypertension: Secondary | ICD-10-CM | POA: Insufficient documentation

## 2020-02-29 LAB — CBC WITH DIFFERENTIAL/PLATELET
Abs Immature Granulocytes: 0.04 10*3/uL (ref 0.00–0.07)
Basophils Absolute: 0.1 10*3/uL (ref 0.0–0.1)
Basophils Relative: 1 %
Eosinophils Absolute: 0.2 10*3/uL (ref 0.0–0.5)
Eosinophils Relative: 2 %
HCT: 40 % (ref 39.0–52.0)
Hemoglobin: 13.3 g/dL (ref 13.0–17.0)
Immature Granulocytes: 1 %
Lymphocytes Relative: 21 %
Lymphs Abs: 1.3 10*3/uL (ref 0.7–4.0)
MCH: 29.3 pg (ref 26.0–34.0)
MCHC: 33.3 g/dL (ref 30.0–36.0)
MCV: 88.1 fL (ref 80.0–100.0)
Monocytes Absolute: 0.5 10*3/uL (ref 0.1–1.0)
Monocytes Relative: 8 %
Neutro Abs: 4.4 10*3/uL (ref 1.7–7.7)
Neutrophils Relative %: 67 %
Platelets: 338 10*3/uL (ref 150–400)
RBC: 4.54 MIL/uL (ref 4.22–5.81)
RDW: 18.3 % — ABNORMAL HIGH (ref 11.5–15.5)
WBC: 6.5 10*3/uL (ref 4.0–10.5)
nRBC: 0 % (ref 0.0–0.2)

## 2020-02-29 LAB — COMPREHENSIVE METABOLIC PANEL
ALT: 21 U/L (ref 0–44)
AST: 25 U/L (ref 15–41)
Albumin: 4.3 g/dL (ref 3.5–5.0)
Alkaline Phosphatase: 81 U/L (ref 38–126)
Anion gap: 7 (ref 5–15)
BUN: 17 mg/dL (ref 8–23)
CO2: 31 mmol/L (ref 22–32)
Calcium: 8.9 mg/dL (ref 8.9–10.3)
Chloride: 98 mmol/L (ref 98–111)
Creatinine, Ser: 0.83 mg/dL (ref 0.61–1.24)
GFR calc Af Amer: >60 mL/min (ref >60)
GFR calc non Af Amer: >60 mL/min (ref >60)
Glucose, Bld: 113 mg/dL — ABNORMAL HIGH (ref 70–99)
Potassium: 5 mmol/L (ref 3.5–5.1)
Sodium: 136 mmol/L (ref 135–145)
Total Bilirubin: 1.6 mg/dL — ABNORMAL HIGH (ref 0.3–1.2)
Total Protein: 7.2 g/dL (ref 6.5–8.1)

## 2020-02-29 LAB — LACTATE DEHYDROGENASE: LDH: 307 U/L — ABNORMAL HIGH (ref 98–192)

## 2020-02-29 NOTE — Progress Notes (Signed)
[facility] NOTE  Patient Care Team: Tracie Harrier, MD as PCP - General (Internal Medicine)  CHIEF COMPLAINTS/PURPOSE OF CONSULTATION: Thrombocytosis  HEMATOLOGY HISTORY   Oncology History Overview Note  # ESSENTIAL THROMBOCYTOSIS [platelets-600-700 since 2017 ; Hb-13; white count- 12]; 2019- CT-NEG for splenomegaly; June 2021-JAK-2 positive; Hydrea 500 mg BID; declines bone marrow biopsy;   # HTN; CAD- S/p CABG 2003; BPH   Essential thrombocythemia ([place])  01/10/2020 Initial Diagnosis   Essential thrombocythemia ([place])    HISTORY OF PRESENTING ILLNESS:  Clinton Gordon 86 y.o.  male essential thrombocytosis on Hydrea is here for follow-up.  No diarrhea.  No nausea no vomiting.  No tingling or numbness no blood clots.  No burning sensation in the extremities.  Review of Systems  Constitutional: Negative for chills, diaphoresis, fever, malaise/fatigue and weight loss.  HENT: Negative for nosebleeds and sore throat.   Eyes: Negative for double vision.  Respiratory: Negative for cough, hemoptysis, sputum production, shortness of breath and wheezing.   Cardiovascular: Negative for chest pain, palpitations, orthopnea and leg swelling.  Gastrointestinal: Negative for abdominal pain, blood in stool, constipation, diarrhea, heartburn, melena, nausea and vomiting.  Genitourinary: Negative for dysuria, frequency and urgency.  Musculoskeletal: Negative for back pain and joint pain.  Skin: Negative.  Negative for itching and rash.  Neurological: Negative for dizziness, tingling, focal weakness, weakness and headaches.  Endo/Heme/Allergies: Does not bruise/bleed easily.  Psychiatric/Behavioral: Negative for depression. The patient is not nervous/anxious and does not have insomnia.      MEDICAL HISTORY:  Past Medical History:  Diagnosis Date  . Hypertension   . Kidney stones     SURGICAL HISTORY: No past surgical history on file.  SOCIAL HISTORY: Social  History   Socioeconomic History  . Marital status: Married    Spouse name: Not on file  . Number of children: Not on file  . Years of education: Not on file  . Highest education level: Not on file  Occupational History  . Not on file  Tobacco Use  . Smoking status: Never Smoker  . Smokeless tobacco: Never Used  Vaping Use  . Vaping Use: Never used  Substance and Sexual Activity  . Alcohol use: No  . Drug use: Not on file  . Sexual activity: Not on file  Other Topics Concern  . Not on file  Social History Narrative   Retd. 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    Social Determinants of Health   Financial Resource Strain:   . Difficulty of Paying Living Expenses:   Food Insecurity:   . Worried About Charity fundraiser in the Last Year:   . Arboriculturist in the Last Year:   Transportation Needs:   . Film/video editor (Medical):   Marland Kitchen Lack of Transportation (Non-Medical):   Physical Activity:   . Days of Exercise per Week:   . Minutes of Exercise per Session:   Stress:   . Feeling of Stress :   Social Connections:   . Frequency of Communication with Friends and Family:   . Frequency of Social Gatherings with Friends and Family:   . Attends Religious Services:   . Active Member of Clubs or Organizations:   . Attends Archivist Meetings:   Marland Kitchen Marital Status:   Intimate Partner Violence:   . Fear of Current or Ex-Partner:   . Emotionally Abused:   Marland Kitchen Physically Abused:   .  Sexually Abused:     FAMILY HISTORY: No family history on file.  ALLERGIES:  has No Known Allergies.  MEDICATIONS:  Current Outpatient Medications  Medication Sig Dispense Refill  . aspirin 81 MG EC tablet Take by mouth.    Marland Kitchen atorvastatin (LIPITOR) 20 MG tablet Take by mouth.    . hydroxyurea (HYDREA) 500 MG capsule Take 1 capsule (500 mg total) by mouth 2 (two) times daily. May take with food to minimize GI side effects. 60 capsule 3   . isosorbide mononitrate (IMDUR) 30 MG 24 hr tablet Take by mouth.    . losartan (COZAAR) 25 MG tablet Take by mouth.    . metoprolol succinate (TOPROL-XL) 25 MG 24 hr tablet Take by mouth.    . tamsulosin (FLOMAX) 0.4 MG CAPS capsule Take by mouth.     No current facility-administered medications for this visit.     PHYSICAL EXAMINATION:   Vitals:   02/29/20 1515  BP: 128/72  Pulse: 68  Resp: 20  Temp: (!) 95.4 F (35.2 C)   Filed Weights   02/29/20 1515  Weight: 148 lb (67.1 kg)    Physical Exam Constitutional:      Comments: Accompanied by son.  Walking independently.  HENT:     Head: Normocephalic and atraumatic.     Mouth/Throat:     Pharynx: No oropharyngeal exudate.  Eyes:     Pupils: Pupils are equal, round, and reactive to light.  Cardiovascular:     Rate and Rhythm: Normal rate and regular rhythm.  Pulmonary:     Effort: Pulmonary effort is normal. No respiratory distress.     Breath sounds: Normal breath sounds. No wheezing.  Abdominal:     General: Bowel sounds are normal. There is no distension.     Palpations: Abdomen is soft. There is no mass.     Tenderness: There is no abdominal tenderness. There is no guarding or rebound.  Musculoskeletal:        General: No tenderness. Normal range of motion.     Cervical back: Normal range of motion and neck supple.  Skin:    General: Skin is warm.  Neurological:     Mental Status: He is alert and oriented to person, place, and time.  Psychiatric:        Mood and Affect: Affect normal.     LABORATORY DATA:  I have reviewed the data as listed Lab Results  Component Value Date   WBC 6.5 02/29/2020   HGB 13.3 02/29/2020   HCT 40.0 02/29/2020   MCV 88.1 02/29/2020   PLT 338 02/29/2020   Recent Labs    02/29/20 1438  NA 136  K 5.0  CL 98  CO2 31  GLUCOSE 113*  BUN 17  CREATININE 0.83  CALCIUM 8.9  GFRNONAA >60  GFRAA >60  PROT 7.2  ALBUMIN 4.3  AST 25  ALT 21  ALKPHOS 81  BILITOT 1.6*      No results found.  ASSESSMENT & PLAN:   Essential thrombocythemia ([place]) # Essential thrombocytosis-JAK-2 positive--platelets 600-700; range since 2017; fairly normal hemoglobin; white count.  High risk for thrombosis-given age/JAK2 mutation- on hydrea 500 mg BID.  #Today platelet count 338.  Continue Hydrea at current dose.  Would not recommend discontinuation of Hydrea.  Continue aspirin once a day.  Discussed the overall prognosis of essential thrombocytosis.  # DISPOSITION: # follow up in 4 months MD;- cbc/cmp/LDH- Dr.B  Cc; Dr.Hande   Cammie Sickle, MD 02/29/2020  9:05 PM

## 2020-02-29 NOTE — Assessment & Plan Note (Addendum)
#   Essential thrombocytosis-JAK-2 positive--platelets 600-700; range since 2017; fairly normal hemoglobin; white count.  High risk for thrombosis-given age/JAK2 mutation- on hydrea 500 mg BID.  #Today platelet count 338.  Continue Hydrea at current dose.  Would not recommend discontinuation of Hydrea.  Continue aspirin once a day.  Discussed the overall prognosis of essential thrombocytosis.  # DISPOSITION: # follow up in 4 months MD;- cbc/cmp/LDH- Dr.B  Cc; Dr.Hande

## 2020-04-17 ENCOUNTER — Inpatient Hospital Stay
Admission: EM | Admit: 2020-04-17 | Discharge: 2020-04-27 | DRG: 177 | Disposition: A | Discharge status: Home Health | Payer: Medicare Other | Attending: Internal Medicine | Admitting: Internal Medicine

## 2020-04-17 ENCOUNTER — Inpatient Hospital Stay: Admit: 2020-04-17 | Payer: Medicare Other

## 2020-04-17 ENCOUNTER — Encounter: Payer: Self-pay | Admitting: Emergency Medicine

## 2020-04-17 ENCOUNTER — Emergency Department: Payer: Medicare Other

## 2020-04-17 ENCOUNTER — Other Ambulatory Visit: Payer: Self-pay

## 2020-04-17 DIAGNOSIS — M7981 Nontraumatic hematoma of soft tissue: Secondary | ICD-10-CM | POA: Diagnosis not present

## 2020-04-17 DIAGNOSIS — I10 Essential (primary) hypertension: Secondary | ICD-10-CM | POA: Diagnosis not present

## 2020-04-17 DIAGNOSIS — Z515 Encounter for palliative care: Secondary | ICD-10-CM | POA: Diagnosis not present

## 2020-04-17 DIAGNOSIS — A0839 Other viral enteritis: Secondary | ICD-10-CM | POA: Diagnosis not present

## 2020-04-17 DIAGNOSIS — A1801 Tuberculosis of spine: Secondary | ICD-10-CM | POA: Diagnosis not present

## 2020-04-17 DIAGNOSIS — U071 COVID-19: Principal | ICD-10-CM

## 2020-04-17 DIAGNOSIS — J9601 Acute respiratory failure with hypoxia: Secondary | ICD-10-CM | POA: Diagnosis present

## 2020-04-17 DIAGNOSIS — Z8611 Personal history of tuberculosis: Secondary | ICD-10-CM

## 2020-04-17 DIAGNOSIS — R578 Other shock: Secondary | ICD-10-CM | POA: Diagnosis not present

## 2020-04-17 DIAGNOSIS — A4189 Other specified sepsis: Secondary | ICD-10-CM | POA: Diagnosis not present

## 2020-04-17 DIAGNOSIS — Z66 Do not resuscitate: Secondary | ICD-10-CM | POA: Diagnosis not present

## 2020-04-17 DIAGNOSIS — I4891 Unspecified atrial fibrillation: Secondary | ICD-10-CM | POA: Diagnosis present

## 2020-04-17 DIAGNOSIS — I77810 Thoracic aortic ectasia: Secondary | ICD-10-CM | POA: Diagnosis not present

## 2020-04-17 DIAGNOSIS — Z7982 Long term (current) use of aspirin: Secondary | ICD-10-CM | POA: Diagnosis not present

## 2020-04-17 DIAGNOSIS — N400 Enlarged prostate without lower urinary tract symptoms: Secondary | ICD-10-CM | POA: Diagnosis present

## 2020-04-17 DIAGNOSIS — Z7189 Other specified counseling: Secondary | ICD-10-CM

## 2020-04-17 DIAGNOSIS — E785 Hyperlipidemia, unspecified: Secondary | ICD-10-CM | POA: Diagnosis not present

## 2020-04-17 DIAGNOSIS — D473 Essential (hemorrhagic) thrombocythemia: Secondary | ICD-10-CM | POA: Diagnosis present

## 2020-04-17 DIAGNOSIS — R0602 Shortness of breath: Secondary | ICD-10-CM | POA: Diagnosis present

## 2020-04-17 DIAGNOSIS — J440 Chronic obstructive pulmonary disease with acute lower respiratory infection: Secondary | ICD-10-CM | POA: Diagnosis present

## 2020-04-17 DIAGNOSIS — Z951 Presence of aortocoronary bypass graft: Secondary | ICD-10-CM | POA: Diagnosis not present

## 2020-04-17 DIAGNOSIS — Z79899 Other long term (current) drug therapy: Secondary | ICD-10-CM

## 2020-04-17 DIAGNOSIS — D62 Acute posthemorrhagic anemia: Secondary | ICD-10-CM | POA: Diagnosis not present

## 2020-04-17 DIAGNOSIS — I272 Pulmonary hypertension, unspecified: Secondary | ICD-10-CM | POA: Diagnosis present

## 2020-04-17 DIAGNOSIS — R29898 Other symptoms and signs involving the musculoskeletal system: Secondary | ICD-10-CM | POA: Diagnosis present

## 2020-04-17 DIAGNOSIS — M79659 Pain in unspecified thigh: Secondary | ICD-10-CM | POA: Diagnosis present

## 2020-04-17 DIAGNOSIS — I2510 Atherosclerotic heart disease of native coronary artery without angina pectoris: Secondary | ICD-10-CM | POA: Diagnosis not present

## 2020-04-17 DIAGNOSIS — A419 Sepsis, unspecified organism: Secondary | ICD-10-CM

## 2020-04-17 DIAGNOSIS — J1282 Pneumonia due to coronavirus disease 2019: Secondary | ICD-10-CM | POA: Diagnosis not present

## 2020-04-17 DIAGNOSIS — J189 Pneumonia, unspecified organism: Secondary | ICD-10-CM

## 2020-04-17 DIAGNOSIS — S7010XA Contusion of unspecified thigh, initial encounter: Secondary | ICD-10-CM | POA: Diagnosis present

## 2020-04-17 LAB — URINALYSIS, COMPLETE (UACMP) WITH MICROSCOPIC
Bacteria, UA: NONE SEEN
Bilirubin Urine: NEGATIVE
Glucose, UA: NEGATIVE mg/dL
Hgb urine dipstick: NEGATIVE
Ketones, ur: NEGATIVE mg/dL
Leukocytes,Ua: NEGATIVE
Nitrite: NEGATIVE
Protein, ur: NEGATIVE mg/dL
Specific Gravity, Urine: 1.011 (ref 1.005–1.030)
Squamous Epithelial / HPF: NONE SEEN (ref 0–5)
pH: 6 (ref 5.0–8.0)

## 2020-04-17 LAB — BASIC METABOLIC PANEL
Anion gap: 7 (ref 5–15)
BUN: 17 mg/dL (ref 8–23)
CO2: 25 mmol/L (ref 22–32)
Calcium: 8.3 mg/dL — ABNORMAL LOW (ref 8.9–10.3)
Chloride: 99 mmol/L (ref 98–111)
Creatinine, Ser: 0.91 mg/dL (ref 0.61–1.24)
GFR calc Af Amer: >60 mL/min (ref >60)
GFR calc non Af Amer: >60 mL/min (ref >60)
Glucose, Bld: 112 mg/dL — ABNORMAL HIGH (ref 70–99)
Potassium: 4.9 mmol/L (ref 3.5–5.1)
Sodium: 131 mmol/L — ABNORMAL LOW (ref 135–145)

## 2020-04-17 LAB — PROTIME-INR
INR: 1.2 (ref 0.8–1.2)
Prothrombin Time: 14.6 seconds (ref 11.4–15.2)

## 2020-04-17 LAB — CBC
HCT: 35.6 % — ABNORMAL LOW (ref 39.0–52.0)
Hemoglobin: 12 g/dL — ABNORMAL LOW (ref 13.0–17.0)
MCH: 32.2 pg (ref 26.0–34.0)
MCHC: 33.7 g/dL (ref 30.0–36.0)
MCV: 95.4 fL (ref 80.0–100.0)
Platelets: 411 10*3/uL — ABNORMAL HIGH (ref 150–400)
RBC: 3.73 MIL/uL — ABNORMAL LOW (ref 4.22–5.81)
RDW: 28.8 % — ABNORMAL HIGH (ref 11.5–15.5)
WBC: 10.3 10*3/uL (ref 4.0–10.5)
nRBC: 0 % (ref 0.0–0.2)

## 2020-04-17 LAB — SARS CORONAVIRUS 2 BY RT PCR (HOSPITAL ORDER, PERFORMED IN ~~LOC~~ HOSPITAL LAB): SARS Coronavirus 2: POSITIVE — AB

## 2020-04-17 LAB — TROPONIN I (HIGH SENSITIVITY)
Troponin I (High Sensitivity): 7 ng/L (ref <18)
Troponin I (High Sensitivity): 7 ng/L (ref <18)

## 2020-04-17 LAB — FIBRIN DERIVATIVES D-DIMER (ARMC ONLY): Fibrin derivatives D-dimer (ARMC): 460.58 ng/mL (FEU) (ref 0.00–499.00)

## 2020-04-17 LAB — PROCALCITONIN: Procalcitonin: <0.1 ng/mL

## 2020-04-17 LAB — C-REACTIVE PROTEIN: CRP: 10.4 mg/dL — ABNORMAL HIGH (ref <1.0)

## 2020-04-17 LAB — ABO/RH: ABO/RH(D): O POS

## 2020-04-17 LAB — LACTATE DEHYDROGENASE: LDH: 170 U/L (ref 98–192)

## 2020-04-17 LAB — FERRITIN: Ferritin: 252 ng/mL (ref 24–336)

## 2020-04-17 LAB — FIBRINOGEN: Fibrinogen: 477 mg/dL — ABNORMAL HIGH (ref 210–475)

## 2020-04-17 LAB — MAGNESIUM: Magnesium: 2 mg/dL (ref 1.7–2.4)

## 2020-04-17 LAB — APTT: aPTT: 52 seconds — ABNORMAL HIGH (ref 24–36)

## 2020-04-17 LAB — LACTIC ACID, PLASMA: Lactic Acid, Venous: 1.5 mmol/L (ref 0.5–1.9)

## 2020-04-17 LAB — TRIGLYCERIDES: Triglycerides: 44 mg/dL (ref <150)

## 2020-04-17 IMAGING — CR DG CHEST 2V
1 series · 2 of 2 positions shown · non-contrast
Comparison: None

CLINICAL DATA: Chest pain, COVID positive

EXAM:
CHEST - 2 VIEW

[Series 1: w chest lat · 0.14mm/px · 2 of 2 slices shown]
[im 1/2]
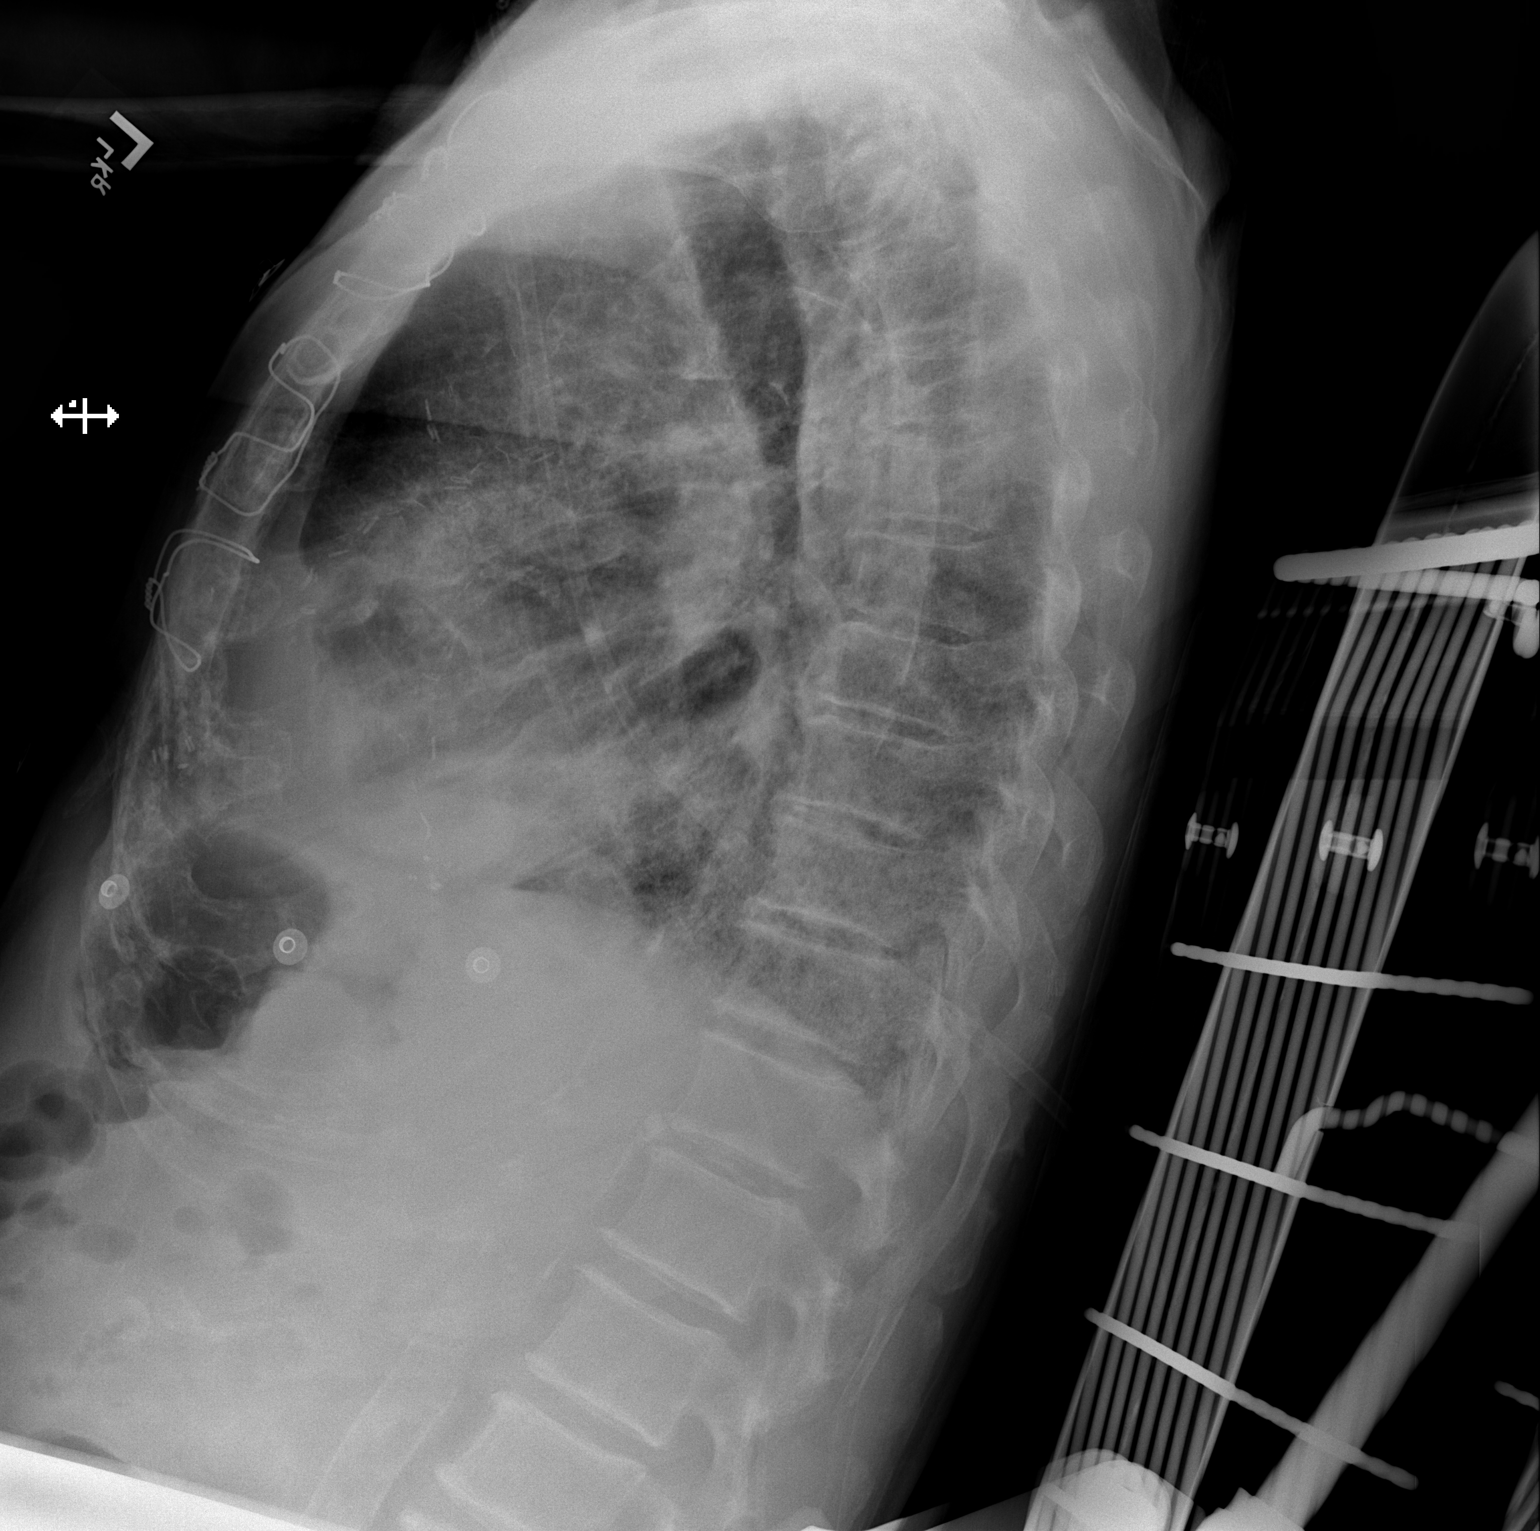
[im 2/2]
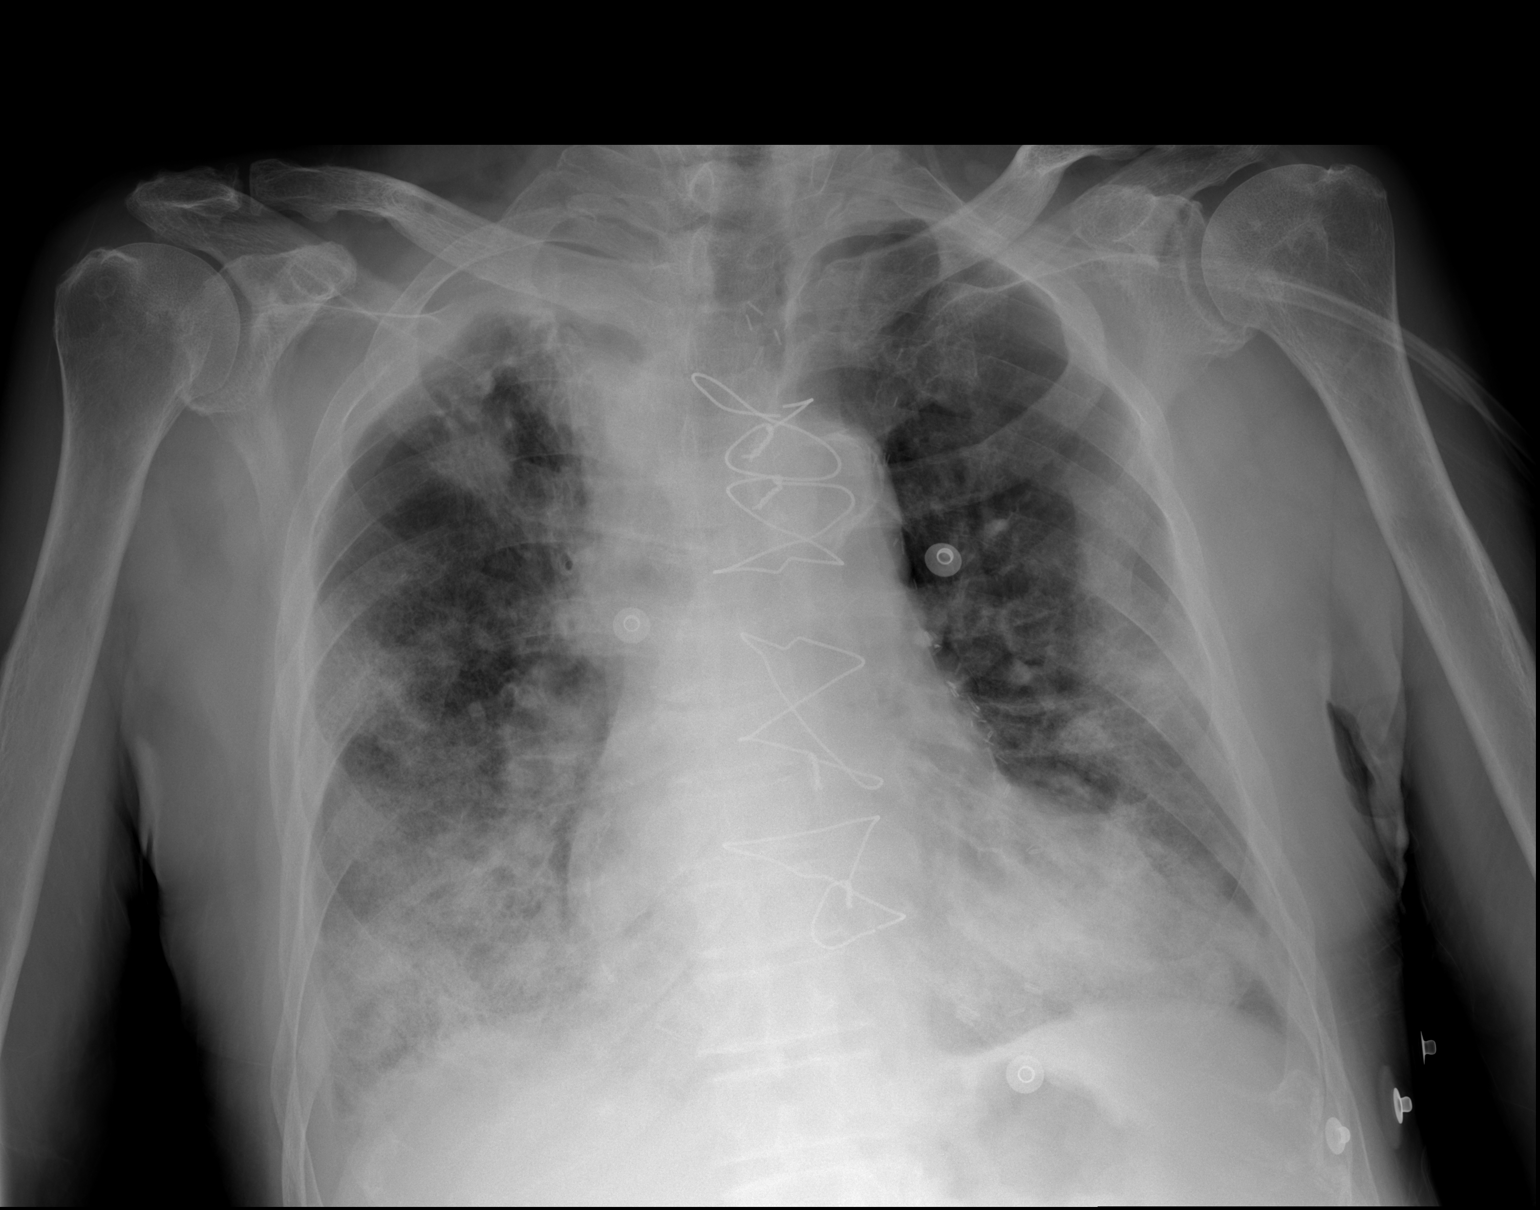

[2 of 2 positions shown; findings below may reference images not displayed]

FINDINGS: Multifocal opacities bilaterally. Possible loculated right pleural
effusion along the lateral chest wall extending to the apex. Right
paratracheal prominence. No pneumothorax. Heart size is within
normal limits. Evidence of prior CABG. No acute osseous abnormality.
IMPRESSION: Multifocal pulmonary opacities suspicious for pneumonia. Possible
loculated right pleural effusion extending to the apex.

## 2020-04-17 IMAGING — CT CT CHEST W/O CM
2 of 3 series · 13 of 36 positions shown, 16 images · non-contrast
Comparison: Radiograph [DATE]

CLINICAL DATA: [LO] positive last week, presents with worsening
shortness of breath, possible effusion

EXAM:
CT CHEST WITHOUT CONTRAST
TECHNIQUE: Multidetector CT imaging of the chest was performed following the
standard protocol without IV contrast.

[Series 2: thorax · axial · 0.72mm/px · z∈[-216,+38]mm · 10 of 149 slices shown, 13 images]
[im 11/149  mediastinal]
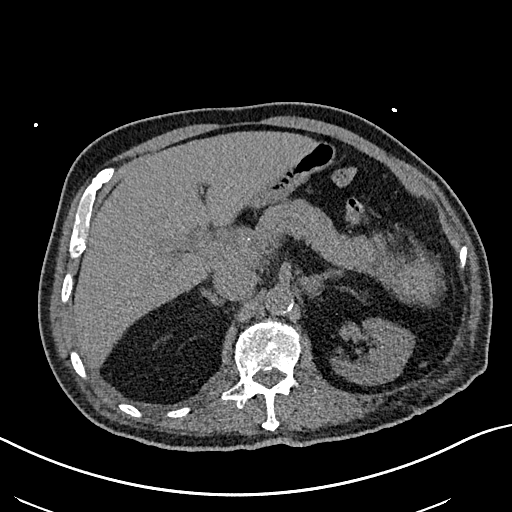
[im 11/149  lung]
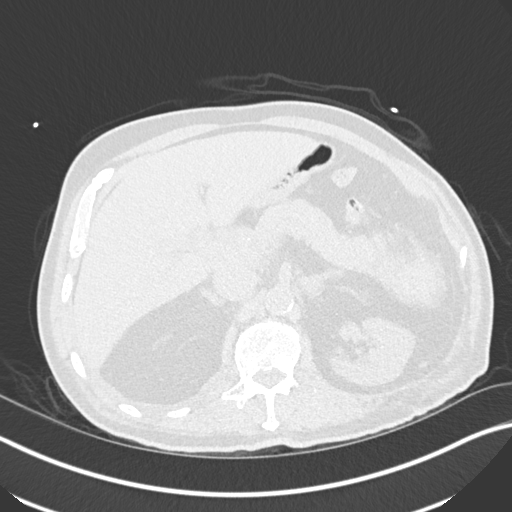
[im 22/149  lung]
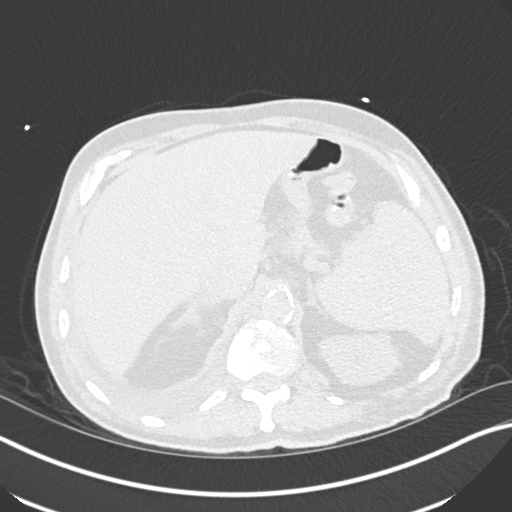
[im 39/149  lung]
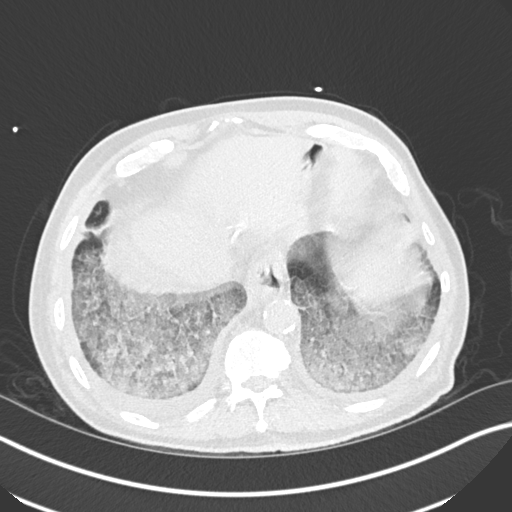
[im 55/149  lung]
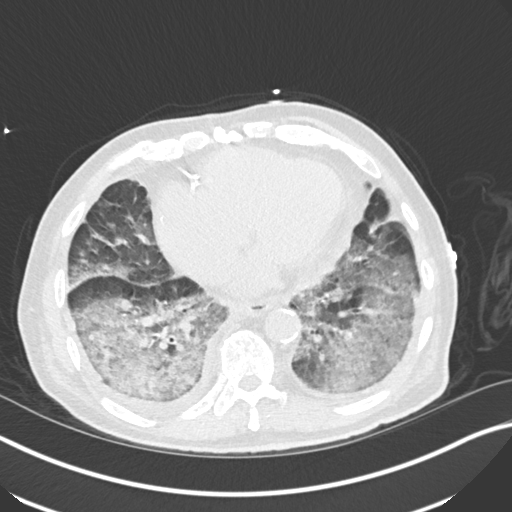
[im 66/149  mediastinal]
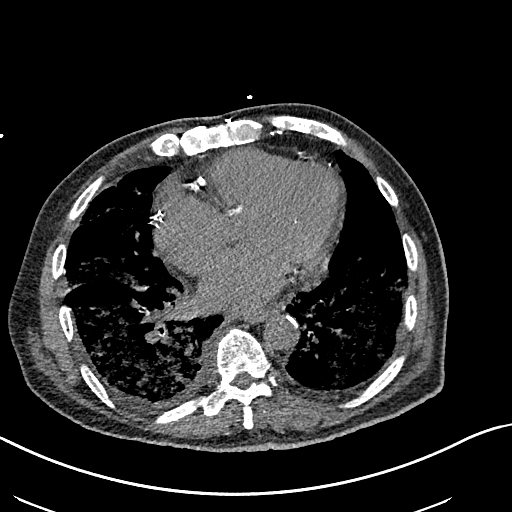
[im 66/149  lung]
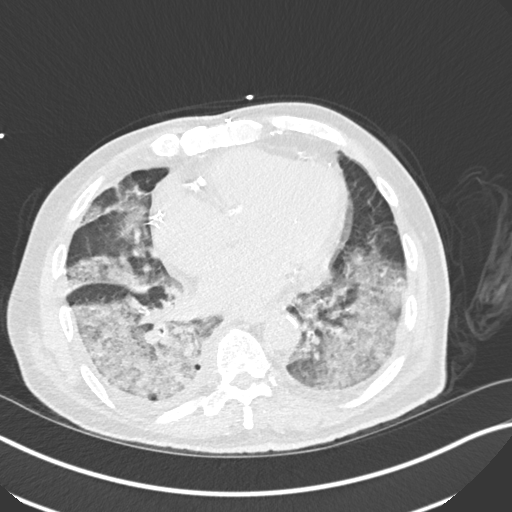
[im 83/149  lung]
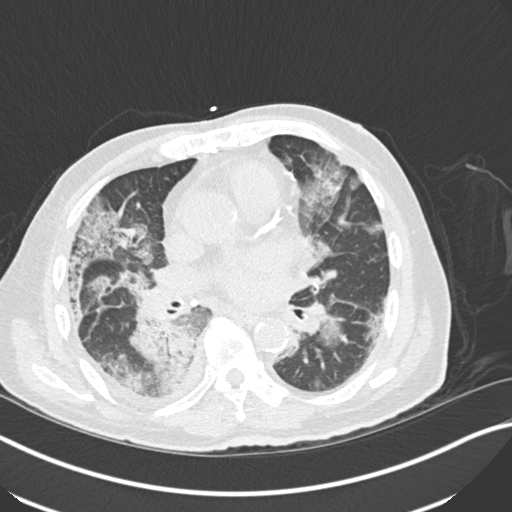
[im 94/149  lung]
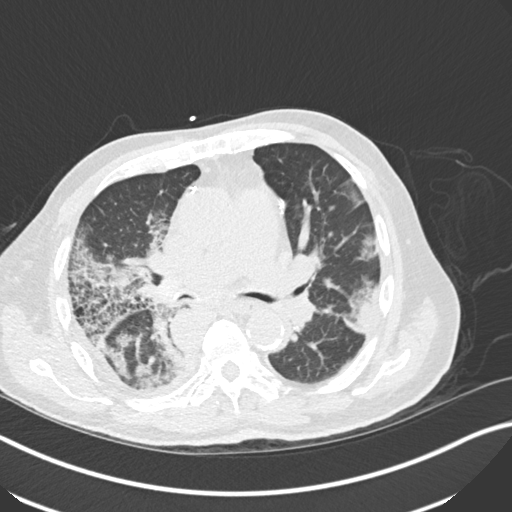
[im 110/149  lung]
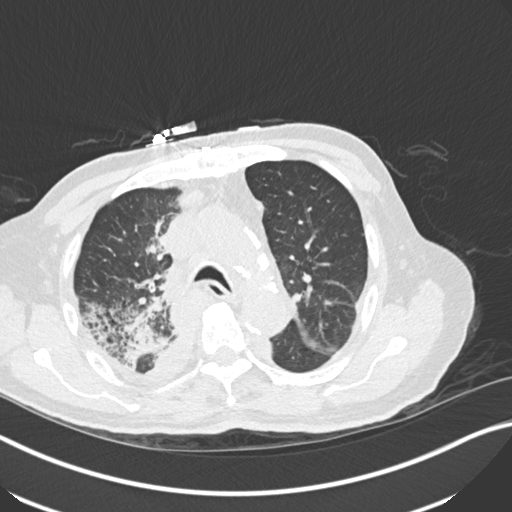
[im 127/149  mediastinal]
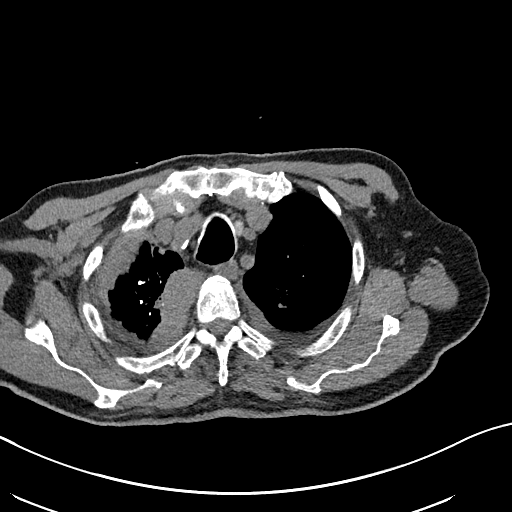
[im 127/149  lung]
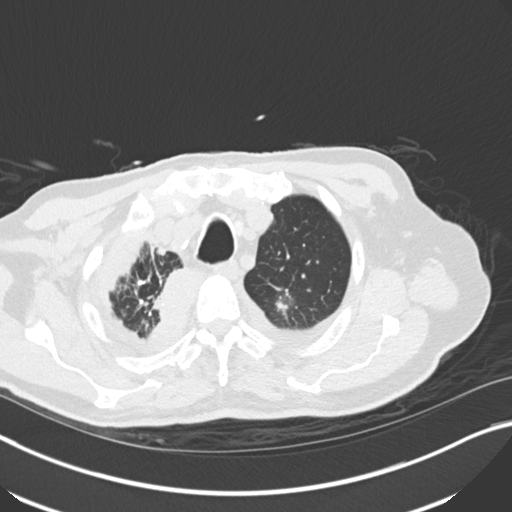
[im 138/149  lung]
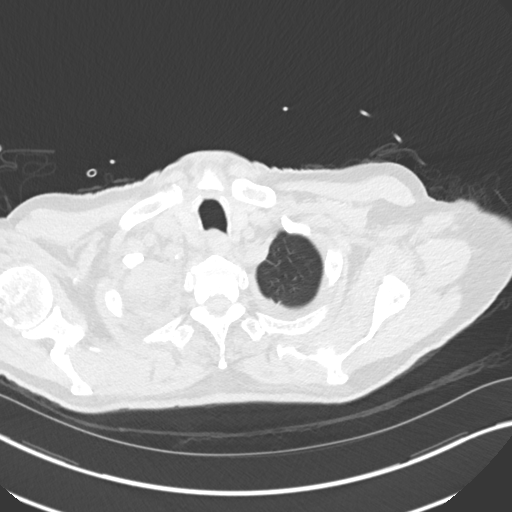

[Series 5: coronal · coronal · 0.66mm/px · 3 of 124 slices shown]
[im 25/124  lung]
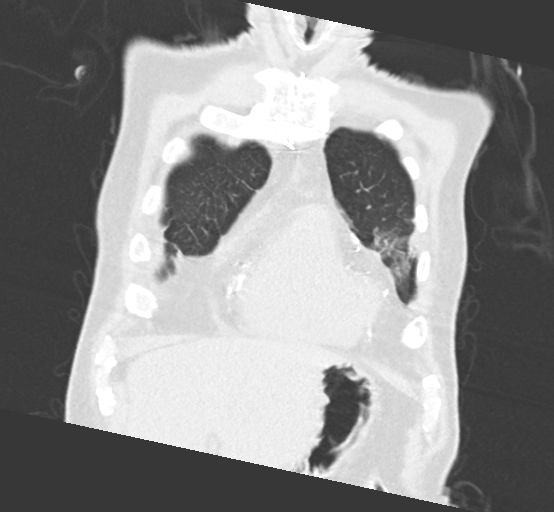
[im 50/124  lung]
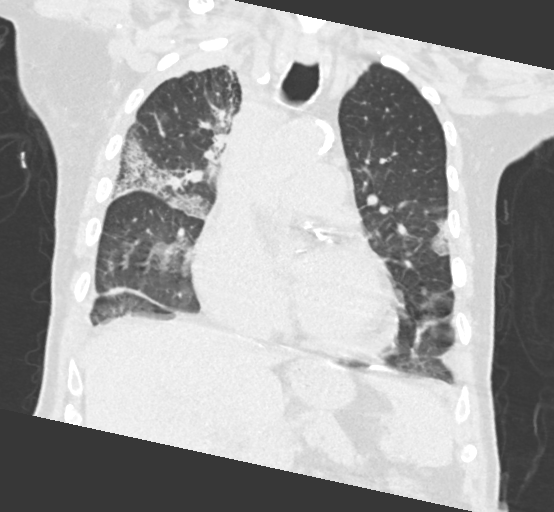
[im 74/124  lung]
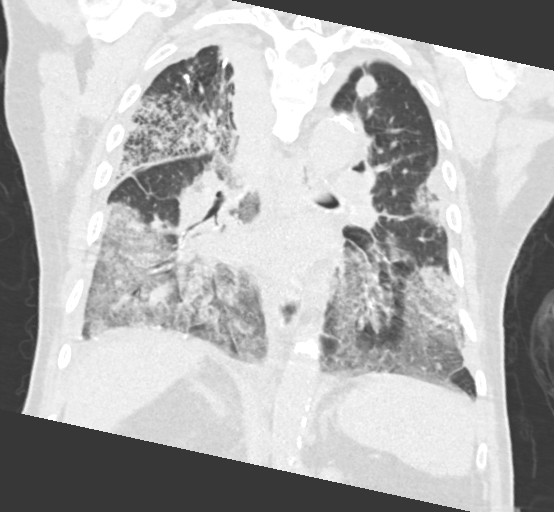

[13 of 36 positions shown; findings below may reference images not displayed]

FINDINGS: Cardiovascular: Borderline cardiomegaly. Three-vessel coronary
artery disease. Calcifications of the aortic leaflets.
Atherosclerotic plaque within the thoracic aorta. Ascending thoracic
aortic dilatation to 4.1 cm central pulmonary arteries are upper
limits normal caliber. Luminal evaluation of vasculature precluded
in the absence of contrast media.

Mediastinum/Nodes: Multiple borderline enlarged mediastinal and
hilar nodes with additional more conglomerate, infiltrative soft
tissue attenuation in the right hilum extending to the
paramediastinal border, margins of which are difficult to fully
ascertain in the absence of contrast media no. Several larger index
nodes in the mediastinum include a 10 mm prevascular lymph node
(2/40), 10 mm AP window lymph node (2/44), and a 11 mm precarinal
lymph node (2/44).

Resulting in narrowing and segmental occlusion of several of the mid
to lower lung airways on the right. Additional flattening of the
lower trachea and airways, could reflect some degree of tracheal
malacia. Secretions noted in the lower airways as well. Thoracic
esophagus with a small hiatal hernia. No other acute abnormality.
Thyroid gland with few punctate calcifications. No discrete nodules.

Lungs/Pleura: Widespread multifocal areas of mixed interstitial,
ground-glass and consolidative opacities with a peripheral and
basilar predominance. There are additional areas of more spiculated
masslike opacity present in the left upper lobe ([DATE]) measuring up
to 2.4 x 2.3 cm in size. A separate spiculated masslike nodule seen
in the left apex as well measuring 1.6 x 1.6 cm in size ([DATE]).
Furthermore, the previously suspected effusion in the right lung
demonstrates some more intermediate attenuation with lobular margins
superiorly possibly reflecting a pleural based lesion given
contiguity with the mediastinal and hilar soft tissue attenuation
posteriorly ([DATE]). Additional nodular pleural thickening is seen in
the left lung as well (2/45). Small fluid attenuation of fusions are
noted more inferiorly bilaterally.

Upper Abdomen: Indeterminate possible cyst arising in the upper
right renal fossa (2/147). Indeterminate nodule of the left adrenal
gland measuring up to 1.6 cm (2/138).

Musculoskeletal: Postsurgical changes from prior sternotomy with
bony fusion across the sternal plate manubrium. Intact sternal
sutures. Multilevel degenerative changes in the spine. Somewhat
indeterminate sclerotic focus present in the left sixth rib
laterally (5/52). Additional mixed sclerotic and lucent lesion seen
T11 spinous process as well (6/81).
IMPRESSION: 1. Widespread multifocal areas of mixed interstitial, ground-glass
and consolidative opacities with a peripheral and basilar
predominance, with a peripheral and basilar predominance. Findings
are favored to represent multifocal pneumonia in the setting of
[LO] positivity.
2. Bilateral spiculated masslike opacities in both upper lobes as
well as more nodular intermediate to soft tissue attenuation pleural
thickening in the upper lungs including a region contiguous with
more diffuse conglomerate in infiltrative soft tissue attenuation
along the right paramediastinal margin concerning for underlying
malignancy/metastatic disease.
3. Additional mediastinal nodes concerning for metastatic
adenopathy.
4. Indeterminate nodule of the left adrenal gland measuring up to
1.6 cm in size, concerning for metastatic disease.
5. Somewhat indeterminate sclerotic focus in the left sixth rib
laterally. Additional mixed sclerotic and lucent lesion seen in the
T11 spinous process as well. Could reflect osseous metastatic
disease.
6. Indeterminate 1 cm mass in the right renal fossa. Possible cyst
from the upper pole right kidney.
7. Aortic Atherosclerosis ([LO]-[LO]).
8. Ascending aortic dilatation to 4.1 cm. Recommend annual imaging
followup by CTA or MRA. This recommendation follows [LO]
ACCF/AHA/AATS/ACR/ASA/SCA/YOLANDADENNIS/YOLANDADENNIS/YOLANDADENNIS/YOLANDADENNIS Guidelines for the
Diagnosis and Management of Patients with Thoracic Aortic Disease.
Circulation. [LO]; 121: [id number]. Aortic aneurysm NOS ([LO]-[LO])
9. Coronary artery calcifications are present. Please note that the
presence of coronary artery calcium documents the presence of
coronary artery disease, the severity of this disease and any
potential stenosis cannot be assessed on this non-gated CT
examination. Assessment for potential risk factor modification,
dietary therapy or pharmacologic therapy may be warranted.

These results were called by telephone at the time of interpretation
on [DATE] at [DATE] to provider Dr. YOLANDADENNIS, who verbally
acknowledged these results.

## 2020-04-17 MED ORDER — SODIUM CHLORIDE 0.9 % IV SOLN: 250.0000 mL | INTRAVENOUS | Status: DC | PRN

## 2020-04-17 MED ORDER — SODIUM CHLORIDE 0.9% FLUSH: 3.0000 mL | INTRAVENOUS | Status: DC | PRN

## 2020-04-17 MED ORDER — ALBUTEROL SULFATE HFA 108 (90 BASE) MCG/ACT IN AERS
1.0000 | INHALATION_SPRAY | RESPIRATORY_TRACT | Status: DC | PRN
  Administered 2020-04-19: 10:00:00 1 via RESPIRATORY_TRACT
  Administered 2020-04-20: 2 via RESPIRATORY_TRACT
  Administered 2020-04-22 – 2020-04-25 (×2): 1 via RESPIRATORY_TRACT
  Filled 2020-04-17: qty 6.7

## 2020-04-17 MED ORDER — SODIUM CHLORIDE 0.9% FLUSH
3.0000 mL | Freq: Two times a day (BID) | INTRAVENOUS | Status: DC
  Administered 2020-04-18 – 2020-04-27 (×15): 3 mL via INTRAVENOUS

## 2020-04-17 MED ORDER — ACETAMINOPHEN 325 MG PO TABS
650.0000 mg | ORAL_TABLET | Freq: Four times a day (QID) | ORAL | Status: DC | PRN
  Administered 2020-04-21 – 2020-04-22 (×2): 650 mg via ORAL
  Filled 2020-04-17 (×2): qty 2

## 2020-04-17 MED ORDER — DEXAMETHASONE SODIUM PHOSPHATE 10 MG/ML IJ SOLN
10.0000 mg | Freq: Once | INTRAMUSCULAR | Status: AC
  Administered 2020-04-17: 10 mg via INTRAVENOUS
  Filled 2020-04-17: qty 1

## 2020-04-17 MED ORDER — POLYETHYLENE GLYCOL 3350 17 G PO PACK: 17.0000 g | PACK | Freq: Every day | ORAL | Status: DC | PRN

## 2020-04-17 MED ORDER — HYDROCOD POLST-CPM POLST ER 10-8 MG/5ML PO SUER
5.0000 mL | Freq: Two times a day (BID) | ORAL | Status: DC | PRN
  Administered 2020-04-26: 5 mL via ORAL
  Filled 2020-04-17: qty 5

## 2020-04-17 MED ORDER — HYDROXYUREA 500 MG PO CAPS
500.0000 mg | ORAL_CAPSULE | Freq: Two times a day (BID) | ORAL | Status: DC
  Administered 2020-04-17 – 2020-04-27 (×20): 500 mg via ORAL
  Filled 2020-04-17 (×22): qty 1

## 2020-04-17 MED ORDER — SODIUM CHLORIDE 0.9 % IV SOLN: INTRAVENOUS | Status: DC

## 2020-04-17 MED ORDER — IPRATROPIUM-ALBUTEROL 20-100 MCG/ACT IN AERS
1.0000 | INHALATION_SPRAY | Freq: Four times a day (QID) | RESPIRATORY_TRACT | Status: DC
  Administered 2020-04-17 – 2020-04-21 (×15): 1 via RESPIRATORY_TRACT
  Filled 2020-04-17: qty 4

## 2020-04-17 MED ORDER — SODIUM CHLORIDE 0.9 % IV SOLN
200.0000 mg | Freq: Once | INTRAVENOUS | Status: AC
  Administered 2020-04-17: 200 mg via INTRAVENOUS
  Filled 2020-04-17: qty 40

## 2020-04-17 MED ORDER — ASPIRIN EC 81 MG PO TBEC
81.0000 mg | DELAYED_RELEASE_TABLET | Freq: Every day | ORAL | Status: DC
  Administered 2020-04-18 – 2020-04-22 (×5): 81 mg via ORAL
  Filled 2020-04-17 (×5): qty 1

## 2020-04-17 MED ORDER — METOPROLOL SUCCINATE ER 25 MG PO TB24
25.0000 mg | ORAL_TABLET | Freq: Every day | ORAL | Status: DC
  Administered 2020-04-18: 09:00:00 25 mg via ORAL
  Filled 2020-04-17: qty 1

## 2020-04-17 MED ORDER — ONDANSETRON HCL 4 MG/2ML IJ SOLN: 4.0000 mg | Freq: Four times a day (QID) | INTRAMUSCULAR | Status: DC | PRN

## 2020-04-17 MED ORDER — LOSARTAN POTASSIUM 25 MG PO TABS
25.0000 mg | ORAL_TABLET | Freq: Every day | ORAL | Status: DC
  Administered 2020-04-18 – 2020-04-22 (×5): 25 mg via ORAL
  Filled 2020-04-17 (×5): qty 1

## 2020-04-17 MED ORDER — GUAIFENESIN-DM 100-10 MG/5ML PO SYRP
10.0000 mL | ORAL_SOLUTION | ORAL | Status: DC | PRN
  Administered 2020-04-18: 21:00:00 10 mL via ORAL
  Filled 2020-04-17 (×2): qty 10

## 2020-04-17 MED ORDER — ISOSORBIDE MONONITRATE ER 30 MG PO TB24
30.0000 mg | ORAL_TABLET | Freq: Every day | ORAL | Status: DC
  Administered 2020-04-18 – 2020-04-22 (×5): 30 mg via ORAL
  Filled 2020-04-17 (×5): qty 1

## 2020-04-17 MED ORDER — SODIUM CHLORIDE 0.9 % IV SOLN
500.0000 mg | INTRAVENOUS | Status: DC
  Administered 2020-04-17: 500 mg via INTRAVENOUS
  Filled 2020-04-17: qty 500

## 2020-04-17 MED ORDER — ATORVASTATIN CALCIUM 20 MG PO TABS
20.0000 mg | ORAL_TABLET | Freq: Every day | ORAL | Status: DC
  Administered 2020-04-18 – 2020-04-27 (×10): 20 mg via ORAL
  Filled 2020-04-17 (×10): qty 1

## 2020-04-17 MED ORDER — DEXAMETHASONE SODIUM PHOSPHATE 10 MG/ML IJ SOLN
6.0000 mg | INTRAMUSCULAR | Status: DC
  Administered 2020-04-18 – 2020-04-20 (×3): 6 mg via INTRAVENOUS
  Filled 2020-04-17 (×3): qty 1

## 2020-04-17 MED ORDER — ENOXAPARIN SODIUM 60 MG/0.6ML ~~LOC~~ SOLN
50.0000 mg | Freq: Two times a day (BID) | SUBCUTANEOUS | Status: DC
  Administered 2020-04-18 – 2020-04-22 (×9): 50 mg via SUBCUTANEOUS
  Filled 2020-04-17 (×12): qty 0.6

## 2020-04-17 MED ORDER — SODIUM CHLORIDE 0.9 % IV SOLN
100.0000 mg | Freq: Every day | INTRAVENOUS | Status: AC
  Administered 2020-04-18 – 2020-04-21 (×4): 100 mg via INTRAVENOUS
  Filled 2020-04-17 (×5): qty 20

## 2020-04-17 MED ORDER — ONDANSETRON HCL 4 MG PO TABS: 4.0000 mg | ORAL_TABLET | Freq: Four times a day (QID) | ORAL | Status: DC | PRN

## 2020-04-17 MED ORDER — SODIUM CHLORIDE 0.9 % IV BOLUS (SEPSIS)
1000.0000 mL | Freq: Once | INTRAVENOUS | Status: AC
  Administered 2020-04-17: 1000 mL via INTRAVENOUS

## 2020-04-17 MED ORDER — SODIUM CHLORIDE 0.9 % IV SOLN
2.0000 g | INTRAVENOUS | Status: DC
  Administered 2020-04-17: 2 g via INTRAVENOUS
  Filled 2020-04-17: qty 20

## 2020-04-17 MED ORDER — TAMSULOSIN HCL 0.4 MG PO CAPS
0.4000 mg | ORAL_CAPSULE | Freq: Every day | ORAL | Status: DC
  Administered 2020-04-17 – 2020-04-26 (×8): 0.4 mg via ORAL
  Filled 2020-04-17 (×8): qty 1

## 2020-04-17 NOTE — ED Triage Notes (Signed)
Pt in via EMS from home with c/oCP. Pt is COVID positive. Pt showing a-fib on the monitor. Pt tested + last week. 96% on 3L. Pt does speak english, unsure of what language he speaks. Pt son on his way

## 2020-04-17 NOTE — ED Notes (Signed)
Transportation requested  

## 2020-04-17 NOTE — ED Notes (Signed)
Unable to use tele-interpreter- language not available at this time. Pt asking for something and pointing at chest. Rhonchi noted in lung bases bilaterally, cough noted. Requested Nebulizer and cough medicine from MD. Awaiting response.

## 2020-04-17 NOTE — ED Notes (Signed)
Green and purple tubes sent to lab.

## 2020-04-17 NOTE — ED Notes (Addendum)
Md at bedside with translator in use. Pt c/o SOB and chest pressure and vomiting in the morning. Pt states he has increased SOB when speaking and with exertion. Pt reports s/s x8-10 days. Pt states he was fully vaccinated for covid back in February of this year.

## 2020-04-17 NOTE — H&P (Addendum)
History and Physical:    Clinton Gordon   [id number] DOB: 25-Feb-1930 DOA: 04/17/2020  Referring MD/provider: Dr. Joni Fears PCP: Tracie Harrier, MD   Patient coming from: Home  Chief Complaint: Difficulty breathing for 2 to 4 days.  History is per patient as well as his sons [name] and Kymere Fullington over the phone.  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  History of Present Illness:   Clinton Gordon is an 86 y.o. male with PMH significant for COPD, HTN, CAD, BPH, essential thrombocytosis and remote history of TB who was in his usual state of health until 4 days ago when he developed malaise and cough.  Patient states 2 days ago he became short of breath and was having difficulty breathing even at rest.  Patient denies any nausea vomiting or diarrhea.  Denies abdominal pain.  Denies chest pain.  His main concern is difficulty catching his breath.  Patient admits to cough, unclear whether it is productive.  Denies fevers or chills. Patient states he has been vaccinated x2 for Covid.  ED Course:  The patient was noted to be afebrile with tachycardia.  He was somewhat hypoxic on room air but corrected to 95% with 2 L.  Chest x-ray showed multifocal pulmonary opacities consistent with multifocal pneumonia as well as a possible loculated left-sided effusion.  Covid test was positive.  Patient was started on steroids and remdesivir and admitted for ongoing treatment.  ROS:   ROS   Review of Systems: As per HPI  Past Medical History:   Past Medical History:  Diagnosis Date  . Hypertension   . Kidney stones     Past Surgical History:   Past Surgical History:  Procedure Laterality Date  . CARDIAC SURGERY      Social History:   Social History   Socioeconomic History  . Marital status: Married    Spouse name: Not on file  . Number of children: Not on file  . Years of education: Not on file  . Highest education level: Not on file  Occupational History    . Not on file  Tobacco Use  . Smoking status: Never Smoker  . Smokeless tobacco: Never Used  Vaping Use  . Vaping Use: Never used  Substance and Sexual Activity  . Alcohol use: No  . Drug use: Not on file  . Sexual activity: Not on file  Other Topics Concern  . Not on file  Social History Narrative   Retd. 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    Social Determinants of Health   Financial Resource Strain:   . Difficulty of Paying Living Expenses: Not on file  Food Insecurity:   . Worried About Charity fundraiser in the Last Year: Not on file  . Ran Out of Food in the Last Year: Not on file  Transportation Needs:   . Lack of Transportation (Medical): Not on file  . Lack of Transportation (Non-Medical): Not on file  Physical Activity:   . Days of Exercise per Week: Not on file  . Minutes of Exercise per Session: Not on file  Stress:   . Feeling of Stress : Not on file  Social Connections:   . Frequency of Communication with Friends and Family: Not on file  . Frequency of Social Gatherings with Friends and Family: Not on file  . Attends Religious Services: Not on file  . Active Member  of Clubs or Organizations: Not on file  . Attends Archivist Meetings: Not on file  . Marital Status: Not on file  Intimate Partner Violence:   . Fear of Current or Ex-Partner: Not on file  . Emotionally Abused: Not on file  . Physically Abused: Not on file  . Sexually Abused: Not on file    Allergies   Patient has no known allergies.  Family history:   No family history on file.  Current Medications:   Prior to Admission medications   Medication Sig Start Date End Date Taking? Authorizing Provider  aspirin 81 MG EC tablet Take 81 mg by mouth daily.    Yes [provider]  atorvastatin (LIPITOR) 20 MG tablet Take 20 mg by mouth daily.  12/16/19  Yes [provider]  hydroxyurea (HYDREA) 500 MG capsule Take 1  capsule (500 mg total) by mouth 2 (two) times daily. May take with food to minimize GI side effects. 01/25/20  Yes Cammie Sickle, MD  isosorbide mononitrate (IMDUR) 30 MG 24 hr tablet Take 30 mg by mouth daily.  12/16/19  Yes [provider]  losartan (COZAAR) 25 MG tablet Take 25 mg by mouth daily.  12/16/19  Yes [provider]  metoprolol succinate (TOPROL-XL) 25 MG 24 hr tablet Take 25 mg by mouth daily.  12/16/19 12/15/20 Yes [provider]  tamsulosin (FLOMAX) 0.4 MG CAPS capsule Take 0.4 mg by mouth daily after supper.  12/16/19  Yes [provider]    Physical Exam:   Vitals:   04/17/20 1430 04/17/20 1500 04/17/20 1515 04/17/20 1530  BP: (!) 125/91 (!) 127/96  136/73  Pulse: (!) 109  (!) 104 (!) 106  Resp: (!) 27 (!) 27  (!) 24  Temp:      TempSrc:      SpO2: 99% 98%  99%  Weight:         Physical Exam: Blood pressure 136/73, pulse (!) 106, temperature 98.3 F (36.8 C), temperature source Oral, resp. rate (!) 24, weight 51.7 kg, SpO2 99 %. Gen: Thin man appearing stated age sitting up in bed with mild unlabored tachypnea.  He appears anxious.   Eyes: sclera anicteric, conjuctiva mildly injected bilaterally CVS: S1-S2, regulary, no gallops Respiratory: Reasonable air entry bilaterally with crackles and mild expiratory wheezes. GI: NABS, soft, NT  LE: No edema. No cyanosis Neuro: A/O x 3, Moving all extremities equally with normal strength, CN 3-12 intact, grossly nonfocal.  Psych: patient is logical and coherent, judgement and insight appear normal, mood and affect appropriate to situation. Skin: no rashes or lesions or ulcers,    Data Review:    Labs: Basic Metabolic Panel: Recent Labs  Lab 04/17/20 1006  NA 131*  K 4.9  CL 99  CO2 25  GLUCOSE 112*  BUN 17  CREATININE 0.91  CALCIUM 8.3*  MG 2.0   Liver Function Tests: No results for input(s): AST, ALT, ALKPHOS, BILITOT, PROT, ALBUMIN in the last 168 hours. No results  for input(s): LIPASE, AMYLASE in the last 168 hours. No results for input(s): AMMONIA in the last 168 hours. CBC: Recent Labs  Lab 04/17/20 1006  WBC 10.3  HGB 12.0*  HCT 35.6*  MCV 95.4  PLT 411*   Cardiac Enzymes: No results for input(s): CKTOTAL, CKMB, CKMBINDEX, TROPONINI in the last 168 hours.  BNP (last 3 results) No results for input(s): PROBNP in the last 8760 hours. CBG: No results for input(s): GLUCAP in the  last 168 hours.  Urinalysis    Component Value Date/Time   COLORURINE YELLOW (A) 04/17/2020 1305   APPEARANCEUR CLEAR (A) 04/17/2020 1305   APPEARANCEUR Clear 04/30/2013 2017   LABSPEC 1.011 04/17/2020 1305   LABSPEC 1.019 04/30/2013 2017   PHURINE 6.0 04/17/2020 1305   GLUCOSEU NEGATIVE 04/17/2020 1305   GLUCOSEU Negative 04/30/2013 2017   HGBUR NEGATIVE 04/17/2020 1305   BILIRUBINUR NEGATIVE 04/17/2020 1305   BILIRUBINUR Negative 04/30/2013 2017   KETONESUR NEGATIVE 04/17/2020 1305   PROTEINUR NEGATIVE 04/17/2020 1305   NITRITE NEGATIVE 04/17/2020 1305   LEUKOCYTESUR NEGATIVE 04/17/2020 1305   LEUKOCYTESUR Negative 04/30/2013 2017      Radiographic Studies: DG Chest 2 View  Result Date: 04/17/2020 CLINICAL DATA:  Chest pain, COVID positive EXAM: CHEST - 2 VIEW COMPARISON:  None FINDINGS: Multifocal opacities bilaterally. Possible loculated right pleural effusion along the lateral chest wall extending to the apex. Right paratracheal prominence. No pneumothorax. Heart size is within normal limits. Evidence of prior CABG. No acute osseous abnormality. IMPRESSION: Multifocal pulmonary opacities suspicious for pneumonia. Possible loculated right pleural effusion extending to the apex. Electronically Signed   By: Macy Mis M.D.   On: 04/17/2020 10:49   CT Chest Wo Contrast  Result Date: 04/17/2020 CLINICAL DATA:  COVID-19 positive last week, presents with worsening shortness of breath, possible effusion EXAM: CT CHEST WITHOUT CONTRAST TECHNIQUE:  Multidetector CT imaging of the chest was performed following the standard protocol without IV contrast. COMPARISON:  Radiograph 04/17/2020 FINDINGS: Cardiovascular: Borderline cardiomegaly. Three-vessel coronary artery disease. Calcifications of the aortic leaflets. Atherosclerotic plaque within the thoracic aorta. Ascending thoracic aortic dilatation to 4.1 cm central pulmonary arteries are upper limits normal caliber. Luminal evaluation of vasculature precluded in the absence of contrast media. Mediastinum/Nodes: Multiple borderline enlarged mediastinal and hilar nodes with additional more conglomerate, infiltrative soft tissue attenuation in the right hilum extending to the paramediastinal border, margins of which are difficult to fully ascertain in the absence of contrast media no. Several larger index nodes in the mediastinum include a 10 mm prevascular lymph node (2/40), 10 mm AP window lymph node (2/44), and a 11 mm precarinal lymph node (2/44). Resulting in narrowing and segmental occlusion of several of the mid to lower lung airways on the right. Additional flattening of the lower trachea and airways, could reflect some degree of tracheal malacia. Secretions noted in the lower airways as well. Thoracic esophagus with a small hiatal hernia. No other acute abnormality. Thyroid gland with few punctate calcifications. No discrete nodules. Lungs/Pleura: Widespread multifocal areas of mixed interstitial, ground-glass and consolidative opacities with a peripheral and basilar predominance. There are additional areas of more spiculated masslike opacity present in the left upper lobe (3/31) measuring up to 2.4 x 2.3 cm in size. A separate spiculated masslike nodule seen in the left apex as well measuring 1.6 x 1.6 cm in size (3/19). Furthermore, the previously suspected effusion in the right lung demonstrates some more intermediate attenuation with lobular margins superiorly possibly reflecting a pleural based  lesion given contiguity with the mediastinal and hilar soft tissue attenuation posteriorly (2/25). Additional nodular pleural thickening is seen in the left lung as well (2/45). Small fluid attenuation of fusions are noted more inferiorly bilaterally. Upper Abdomen: Indeterminate possible cyst arising in the upper right renal fossa (2/147). Indeterminate nodule of the left adrenal gland measuring up to 1.6 cm (2/138). Musculoskeletal: Postsurgical changes from prior sternotomy with bony fusion across the sternal plate manubrium. Intact sternal sutures. Multilevel degenerative  changes in the spine. Somewhat indeterminate sclerotic focus present in the left sixth rib laterally (5/52). Additional mixed sclerotic and lucent lesion seen T11 spinous process as well (6/81). IMPRESSION: 1. Widespread multifocal areas of mixed interstitial, ground-glass and consolidative opacities with a peripheral and basilar predominance, with a peripheral and basilar predominance. Findings are favored to represent multifocal pneumonia in the setting of COVID-19 positivity. 2. Bilateral spiculated masslike opacities in both upper lobes as well as more nodular intermediate to soft tissue attenuation pleural thickening in the upper lungs including a region contiguous with more diffuse conglomerate in infiltrative soft tissue attenuation along the right paramediastinal margin concerning for underlying malignancy/metastatic disease. 3. Additional mediastinal nodes concerning for metastatic adenopathy. 4. Indeterminate nodule of the left adrenal gland measuring up to 1.6 cm in size, concerning for metastatic disease. 5. Somewhat indeterminate sclerotic focus in the left sixth rib laterally. Additional mixed sclerotic and lucent lesion seen in the T11 spinous process as well. Could reflect osseous metastatic disease. 6. Indeterminate 1 cm mass in the right renal fossa. Possible cyst from the upper pole right kidney. 7. Aortic Atherosclerosis  (ICD10-I70.0). 8. Ascending aortic dilatation to 4.1 cm. Recommend annual imaging followup by CTA or MRA. This recommendation follows 2010 ACCF/AHA/AATS/ACR/ASA/SCA/SCAI/SIR/STS/SVM Guidelines for the Diagnosis and Management of Patients with Thoracic Aortic Disease. Circulation. 2010; 121: L798-X211. Aortic aneurysm NOS (ICD10-I71.9) 9. Coronary artery calcifications are present. Please note that the presence of coronary artery calcium documents the presence of coronary artery disease, the severity of this disease and any potential stenosis cannot be assessed on this non-gated CT examination. Assessment for potential risk factor modification, dietary therapy or pharmacologic therapy may be warranted. These results were called by telephone at the time of interpretation on 04/17/2020 at 4:23 pm to provider Dr. Cherylann Banas, who verbally acknowledged these results. Electronically Signed   By: Lovena Le M.D.   On: 04/17/2020 16:24    EKG: Independently reviewed.  Atrial fibrillation with RBBB at 115.   Assessment/Plan:   Principal Problem:   Pneumonia due to COVID-19 virus Active Problems:   Essential thrombocythemia ([place])   Hypertension   BPH without obstruction/lower urinary tract symptoms   86 year old man with CAD is admitted with Covid pneumonia, what appears to be new onset atrial fibrillation and abnormal chest CT with spiculated nodules.  Patient has known history of previous TB.  Covid pneumonia in setting of known COPD Patient is oxygenating well on 2 L nasal cannula Procalcitonin less than 0.10, will discontinue azithromycin and ceftriaxone Continue Decadron and remdesivir Oxygen as needed Given wheezing with known COPD, will place patient on Combivent and as needed albuterol inhaler Antitussives ordered  New onset atrial fibrillation that is rate controlled Has atrial fibrillation on EKG, no prior history of atrial fibrillation as far as we know. Continue metoprolol 25 which does  seem to be controlling his rate, can increase as warranted CHADS2 score is 4, will start patient on IV heparin for now, can switch over to Eliquis as warranted Echocardiogram ordered Keep patient on telemetry  Abnormal chest CT with nodules Patient has spiculated nodules with adenopathy on chest CT which is concerning for malignancy However patient's son Dr [name] states he has history of TB in the past This will need to be investigated either as an Inpatient or outpatient after Covid pneumonia is improved  CAD No chest pain, no evidence for ACS Continue aspirin, metoprolol, atorvastatin, Imdur  HTN Continue losartan and metoprolol  BPH Continue Flomax  Other information:   DVT prophylaxis: Full dose Lovenox ordered Code Status: Full, discussed with patient's sons who confirmed that he would like to be full code. Family Communication: Spoke with patient's sons Maurene Capes and Tanis Burnley over the phone. Ashby Moskal is a former Psychologist, sport and exercise who practiced in [place].  Disposition Plan: Home Consults called: None Admission status: Inpatient  [facility]  If 7PM-7AM, please contact night-coverage [email or web address] Password [email or web address] 04/17/2020, 5:05 PM

## 2020-04-17 NOTE — Progress Notes (Signed)
CODE SEPSIS - PHARMACY COMMUNICATION  **Broad Spectrum Antibiotics should be administered within 1 hour of Sepsis diagnosis**  Time Code Sepsis Called/Page Received: 1222  Antibiotics Ordered: ceftriaxone / azithromycin  Time of 1st antibiotic administration: 1253  Additional action taken by pharmacy: n/a   Benita Gutter 04/17/2020  12:21 PM

## 2020-04-17 NOTE — ED Notes (Signed)
MD writing for inhaler, see previous note.

## 2020-04-17 NOTE — Progress Notes (Signed)
°   04/17/20 1915  Assess: MEWS Score  Temp 98.3 F (36.8 C)  BP (!) 160/100  Pulse Rate 100  ECG Heart Rate (!) 116  Resp (!) 22  Level of Consciousness Alert  SpO2 97 %  O2 Device Nasal Cannula  O2 Flow Rate (L/min) 4 L/min  Assess: MEWS Score  MEWS Temp 0  MEWS Systolic 0  MEWS Pulse 2  MEWS RR 1  MEWS LOC 0  MEWS Score 3  MEWS Score Color Yellow  Assess: if the MEWS score is Yellow or Red  Were vital signs taken at a resting state? Yes  Focused Assessment No change from prior assessment  Early Detection of Sepsis Score *See Row Information* Low  MEWS guidelines implemented *See Row Information* Yes  Treat  MEWS Interventions Administered scheduled meds/treatments  Pain Scale 0-10  Pain Score 0  Take Vital Signs  Increase Vital Sign Frequency  Yellow: Q 2hr X 2 then Q 4hr X 2, if remains yellow, continue Q 4hrs  Escalate  MEWS: Escalate Yellow: discuss with charge nurse/RN and consider discussing with provider and RRT  Notify: Charge Nurse/RN  Name of Charge Nurse/RN Notified Mary RN  Date Charge Nurse/RN Notified 04/17/20  Time Charge Nurse/RN Notified [place]  Patient Outcome Other (Comment) (night nurse will give night meds for patient. )  Progress note created (see row info) Yes   Patient is anxious due to not being able to breathe efficiently. He is resting in bed. Talked with patient through translator app with IPAD services. He is not complaining of any pain. Bed in low position and call bell in reach. Gave report to [place] and notified him of patient status and MEWS he stated that he would give patient medication. Alerted charge nurse [facility]. Patient is resting in bed with call bell in reach bed in low position.

## 2020-04-17 NOTE — ED Triage Notes (Signed)
Says his sob got better few days ago and then this am it suddenly increased.  Says sob with very little walking.  Diarrhea since yesterday.  Also he feels anxious.

## 2020-04-17 NOTE — ED Notes (Signed)
Veleta Miners top resent to lab.

## 2020-04-17 NOTE — ED Notes (Signed)
Son updated of covid test with pt permission. Son speaking with pt at this time.

## 2020-04-17 NOTE — Consult Note (Addendum)
[place] for Lovenox Indication: atrial fibrillation  No Known Allergies  Patient Measurements: Weight: 51.7 kg (114 lb)  Vital Signs: Temp: 98.3 F (36.8 C) (08/30 1014) Temp Source: Oral (08/30 1014) BP: 136/73 (08/30 1530) Pulse Rate: 104 (08/30 1700)  Labs: Recent Labs    04/17/20 1006 04/17/20 1202  HGB 12.0*  --   HCT 35.6*  --   PLT 411*  --   CREATININE 0.91  --   TROPONINIHS 7 7    Estimated Creatinine Clearance: 41.8 mL/min (by C-G formula based on SCr of 0.91 mg/dL).   Medications:  No anticoagulation prior to admission per chart review  Assessment: Patient is an 86 y/o M with medical history including essential thrombocythemia who is admitted with COVID-19 pneumonia. Patient noted to be in atrial fibrillation. Pharmacy has been consulted to initiate therapeutic Lovenox for atrial fibrillation.   Baseline CBC within normal limits except for platelet count of 411.    Plan:  --Lovenox 50 mg (1 mg/kg) q12h --CBC per protocol --Monitor renal function and adjust as indicated   Benita Gutter 04/17/2020,5:28 PM

## 2020-04-17 NOTE — ED Provider Notes (Signed)
[facility] Emergency Department Provider Note  ____________________________________________  Time seen: Approximately 12:49 PM  I have reviewed the triage vital signs and the nursing notes.   HISTORY  Chief Complaint Shortness of Breath  Encounter completed with [place] video interpreter  HPI Clinton Gordon is a 86 y.o. male with a history of hypertension, hyperlipidemia, CAD status post CABG who comes the ED complaining of worsening shortness of breath for the past 10 days, gradual onset and worsening.  Now unable to walk even a few steps at home without being very out of breath.  Associated with a feeling of chest heaviness which is not pleuritic and not exertional.  Nonradiating.  No alleviating factors.  Also notes vomiting today and diarrhea over the past several days with decreased appetite.  EMS reported patient is Covid positive, which patient denies. Room air oxygen saturation was 86% for EMS.  He states that he had his second Covid vaccine in February 2021.       Past Medical History:  Diagnosis Date  . Hypertension   . Kidney stones      Patient Active Problem List   Diagnosis Date Noted  . Essential thrombocythemia ([place]) 01/10/2020     Past Surgical History:  Procedure Laterality Date  . CARDIAC SURGERY       Prior to Admission medications   Medication Sig Start Date End Date Taking? Authorizing Provider  aspirin 81 MG EC tablet Take 81 mg by mouth daily.    Yes [provider]  atorvastatin (LIPITOR) 20 MG tablet Take 20 mg by mouth daily.  12/16/19  Yes [provider]  hydroxyurea (HYDREA) 500 MG capsule Take 1 capsule (500 mg total) by mouth 2 (two) times daily. May take with food to minimize GI side effects. 01/25/20  Yes Cammie Sickle, MD  isosorbide mononitrate (IMDUR) 30 MG 24 hr tablet Take 30 mg by mouth daily.  12/16/19  Yes [provider]  losartan (COZAAR) 25 MG tablet Take 25 mg by  mouth daily.  12/16/19  Yes [provider]  metoprolol succinate (TOPROL-XL) 25 MG 24 hr tablet Take 25 mg by mouth daily.  12/16/19 12/15/20 Yes [provider]  tamsulosin (FLOMAX) 0.4 MG CAPS capsule Take 0.4 mg by mouth daily after supper.  12/16/19  Yes [provider]     Allergies Patient has no known allergies.   No family history on file.  Social History Social History   Tobacco Use  . Smoking status: Never Smoker  . Smokeless tobacco: Never Used  Vaping Use  . Vaping Use: Never used  Substance Use Topics  . Alcohol use: No  . Drug use: Not on file    Review of Systems  Constitutional:   No fever or chills.  ENT:   No sore throat. No rhinorrhea. Cardiovascular: Positive chest pain as above without syncope. Respiratory: Positive shortness of breath and nonproductive cough. Gastrointestinal:   Negative for abdominal pain, positive vomiting and diarrhea.  Musculoskeletal:   Negative for focal pain or swelling All other systems reviewed and are negative except as documented above in ROS and HPI.  ____________________________________________   PHYSICAL EXAM:  VITAL SIGNS: ED Triage Vitals  Enc Vitals Group     BP 04/17/20 0954 136/76     Pulse Rate 04/17/20 0954 (!) 114     Resp 04/17/20 0954 (!) 22     Temp 04/17/20 0954 98.3 F (36.8 C)     Temp Source 04/17/20  0954 Oral     SpO2 04/17/20 0954 100 %     Weight 04/17/20 1017 114 lb (51.7 kg)     Height --      Head Circumference --      Peak Flow --      Pain Score 04/17/20 1016 8     Pain Loc --      Pain Edu? --      Excl. in [place]? --     Vital signs reviewed, nursing assessments reviewed.   Constitutional:   Alert and oriented.  Ill-appearing. Eyes:   Conjunctivae are normal. EOMI. PERRL. ENT      Head:   Normocephalic and atraumatic.      Nose:   Normal      Mouth/Throat: Dry mucous membranes      Neck:   No meningismus. Full  ROM. Hematological/Lymphatic/Immunilogical:   No cervical lymphadenopathy. Cardiovascular:   Tachycardia heart rate 110. Symmetric bilateral radial and DP pulses.  No murmurs. Cap refill less than 2 seconds. Respiratory:   Tachypnea with respiratory rate of 25.  There bilateral basilar crackles.  Symmetric air movement. Gastrointestinal:   Soft and nontender. Non distended. There is no CVA tenderness.  No rebound, rigidity, or guarding.  Musculoskeletal:   Normal range of motion in all extremities. No joint effusions.  No lower extremity tenderness.  No edema. Neurologic:   Normal speech and language.  Motor grossly intact. No acute focal neurologic deficits are appreciated.  Skin:    Skin is warm, dry and intact. No rash noted.  No petechiae, purpura, or bullae.  ____________________________________________    LABS (pertinent positives/negatives) (all labs ordered are listed, but only abnormal results are displayed) Labs Reviewed  SARS CORONAVIRUS 2 BY RT PCR ([place] [facility]) - Abnormal; Notable for the following components:      Result Value   SARS Coronavirus 2 POSITIVE (*)    All other components within normal limits  BASIC METABOLIC PANEL - Abnormal; Notable for the following components:   Sodium 131 (*)    Glucose, Bld 112 (*)    Calcium 8.3 (*)    All other components within normal limits  CBC - Abnormal; Notable for the following components:   RBC 3.73 (*)    Hemoglobin 12.0 (*)    HCT 35.6 (*)    RDW 28.8 (*)    Platelets 411 (*)    All other components within normal limits  URINALYSIS, COMPLETE (UACMP) WITH MICROSCOPIC - Abnormal; Notable for the following components:   Color, Urine YELLOW (*)    APPearance CLEAR (*)    All other components within normal limits  CULTURE, BLOOD (ROUTINE X 2)  CULTURE, BLOOD (ROUTINE X 2)  URINE CULTURE  LACTIC ACID, PLASMA  MAGNESIUM  FIBRIN DERIVATIVES D-DIMER ([facility] ONLY)  PROCALCITONIN   LACTATE DEHYDROGENASE  FERRITIN  TRIGLYCERIDES  FIBRINOGEN  C-REACTIVE PROTEIN  TROPONIN I (HIGH SENSITIVITY)  TROPONIN I (HIGH SENSITIVITY)   ____________________________________________   EKG  Interpreted by me Atrial fibrillation, rate of 115.  Normal axis and intervals.  Right bundle branch block.  No acute ischemic changes.  A. fib is new compared to prior EKG in 2014.  ____________________________________________    RADIOLOGY  DG Chest 2 View  Result Date: 04/17/2020 CLINICAL DATA:  Chest pain, COVID positive EXAM: CHEST - 2 VIEW COMPARISON:  None FINDINGS: Multifocal opacities bilaterally. Possible loculated right pleural effusion along the lateral chest wall extending to the apex.  Right paratracheal prominence. No pneumothorax. Heart size is within normal limits. Evidence of prior CABG. No acute osseous abnormality. IMPRESSION: Multifocal pulmonary opacities suspicious for pneumonia. Possible loculated right pleural effusion extending to the apex. Electronically Signed   By: Macy Mis M.D.   On: 04/17/2020 10:49    ____________________________________________   PROCEDURES .Critical Care Performed by: Carrie Mew, MD Authorized by: Carrie Mew, MD   Critical care provider statement:    Critical care time (minutes):  35   Critical care time was exclusive of:  Separately billable procedures and treating other patients   Critical care was time spent personally by me on the following activities:  Development of treatment plan with patient or surrogate, discussions with consultants, evaluation of patient's response to treatment, examination of patient, obtaining history from patient or surrogate, ordering and performing treatments and interventions, ordering and review of laboratory studies, ordering and review of radiographic studies, pulse oximetry, re-evaluation of patient's condition and review of old  charts    ____________________________________________  DIFFERENTIAL DIAGNOSIS   Covid, multifocal pneumonia, pulmonary edema, electrolyte abnormality, dehydration  CLINICAL IMPRESSION / ASSESSMENT AND PLAN / ED COURSE  Medications ordered in the ED: Medications  0.9 %  sodium chloride infusion ( Intravenous New Bag/Given 04/17/20 1302)  cefTRIAXone (ROCEPHIN) 2 g in sodium chloride 0.9 % 100 mL IVPB (0 g Intravenous Stopped 04/17/20 1343)  azithromycin (ZITHROMAX) 500 mg in sodium chloride 0.9 % 250 mL IVPB (0 mg Intravenous Stopped 04/17/20 1404)  dexamethasone (DECADRON) injection 10 mg (has no administration in time range)  remdesivir 200 mg in sodium chloride 0.9% 250 mL IVPB (has no administration in time range)    Followed by  remdesivir 100 mg in sodium chloride 0.9 % 100 mL IVPB (has no administration in time range)  sodium chloride 0.9 % bolus 1,000 mL (0 mLs Intravenous Stopped 04/17/20 1344)    Pertinent labs & imaging results that were available during my care of the patient were reviewed by me and considered in my medical decision making (see chart for details).  Clinton Gordon was evaluated in Emergency Department on 04/17/2020 for the symptoms described in the history of present illness. He was evaluated in the context of the global COVID-19 pandemic, which necessitated consideration that the patient might be at risk for infection with the SARS-CoV-2 virus that causes COVID-19. Institutional protocols and algorithms that pertain to the evaluation of patients at risk for COVID-19 are in a state of rapid change based on information released by regulatory bodies including the CDC and federal and state organizations. These policies and algorithms were followed during the patient's care in the ED.   Patient presents with chest pain shortness of breath cough, tachycardia hypoxia and chest x-ray consistent with multifocal pneumonia.  Doubt ACS PE dissection AAA pneumothorax or  pericarditis.  Will give IV fluid bolus, check labs including lactate and blood culture, give ceftriaxone and azithromycin for antibiotic coverage, check Covid test.  Patient will need to be admitted due to hypoxic respiratory failure.  Clinical Course as of Apr 18 1447  Mon Apr 17, 2020  1435 Covid test is positive.  Will order remdesivir and Decadron and admit.   [PS]    Clinical Course User Index [PS] Carrie Mew, MD     ----------------------------------------- 2:48 PM on 04/17/2020 -----------------------------------------  Discussed with hospitalist who recommends additional labs and noncontrast CT chest.  ____________________________________________   FINAL CLINICAL IMPRESSION(S) / ED DIAGNOSES    Final diagnoses:  Multifocal pneumonia  Sepsis with acute hypoxic respiratory failure without septic shock, due to unspecified organism [facility])  Pneumonia due to COVID-19 virus     ED Discharge Orders    None      Portions of this note were generated with dragon dictation software. Dictation errors may occur despite best attempts at proofreading.   Carrie Mew, MD 04/17/20 [phone]

## 2020-04-17 NOTE — Consult Note (Signed)
Remdesivir - Pharmacy Brief Note   O:  ALT: 21 (02/29/20), no data available this admission CXR: "Multifocal pulmonary opacities suspicious for pneumonia" SpO2: Hypoxic requiring supplemental oxygen   A/P:  04/17/20 SARS-CoV-2 PCR (+)  Remdesivir 200 mg IVPB once followed by 100 mg IVPB daily x 4 days.   Benita Gutter 04/17/2020 2:38 PM

## 2020-04-18 ENCOUNTER — Inpatient Hospital Stay (HOSPITAL_COMMUNITY)
Admit: 2020-04-18 | Discharge: 2020-04-18 | Disposition: A | Discharge status: Home / Self Care | Payer: Medicare Other | Attending: Internal Medicine | Admitting: Internal Medicine

## 2020-04-18 DIAGNOSIS — R0602 Shortness of breath: Secondary | ICD-10-CM | POA: Diagnosis not present

## 2020-04-18 DIAGNOSIS — I361 Nonrheumatic tricuspid (valve) insufficiency: Secondary | ICD-10-CM

## 2020-04-18 DIAGNOSIS — J1282 Pneumonia due to coronavirus disease 2019: Secondary | ICD-10-CM | POA: Diagnosis not present

## 2020-04-18 DIAGNOSIS — U071 COVID-19: Secondary | ICD-10-CM | POA: Diagnosis not present

## 2020-04-18 LAB — CBC WITH DIFFERENTIAL/PLATELET
Abs Immature Granulocytes: 0.15 10*3/uL — ABNORMAL HIGH (ref 0.00–0.07)
Basophils Absolute: 0 10*3/uL (ref 0.0–0.1)
Basophils Relative: 0 %
Eosinophils Absolute: 0 10*3/uL (ref 0.0–0.5)
Eosinophils Relative: 0 %
HCT: 35.4 % — ABNORMAL LOW (ref 39.0–52.0)
Hemoglobin: 12.2 g/dL — ABNORMAL LOW (ref 13.0–17.0)
Immature Granulocytes: 2 %
Lymphocytes Relative: 6 %
Lymphs Abs: 0.6 10*3/uL — ABNORMAL LOW (ref 0.7–4.0)
MCH: 32.4 pg (ref 26.0–34.0)
MCHC: 34.5 g/dL (ref 30.0–36.0)
MCV: 93.9 fL (ref 80.0–100.0)
Monocytes Absolute: 0.8 10*3/uL (ref 0.1–1.0)
Monocytes Relative: 8 %
Neutro Abs: 8.2 10*3/uL — ABNORMAL HIGH (ref 1.7–7.7)
Neutrophils Relative %: 84 %
Platelets: 420 10*3/uL — ABNORMAL HIGH (ref 150–400)
RBC: 3.77 MIL/uL — ABNORMAL LOW (ref 4.22–5.81)
RDW: 28.9 % — ABNORMAL HIGH (ref 11.5–15.5)
Smear Review: NORMAL
WBC: 9.7 10*3/uL (ref 4.0–10.5)
nRBC: 0 % (ref 0.0–0.2)

## 2020-04-18 LAB — COMPREHENSIVE METABOLIC PANEL
ALT: 22 U/L (ref 0–44)
AST: 33 U/L (ref 15–41)
Albumin: 2.8 g/dL — ABNORMAL LOW (ref 3.5–5.0)
Alkaline Phosphatase: 80 U/L (ref 38–126)
Anion gap: 7 (ref 5–15)
BUN: 19 mg/dL (ref 8–23)
CO2: 22 mmol/L (ref 22–32)
Calcium: 8.3 mg/dL — ABNORMAL LOW (ref 8.9–10.3)
Chloride: 103 mmol/L (ref 98–111)
Creatinine, Ser: 0.95 mg/dL (ref 0.61–1.24)
GFR calc Af Amer: >60 mL/min (ref >60)
GFR calc non Af Amer: >60 mL/min (ref >60)
Glucose, Bld: 130 mg/dL — ABNORMAL HIGH (ref 70–99)
Potassium: 5.1 mmol/L (ref 3.5–5.1)
Sodium: 132 mmol/L — ABNORMAL LOW (ref 135–145)
Total Bilirubin: 1.4 mg/dL — ABNORMAL HIGH (ref 0.3–1.2)
Total Protein: 6.3 g/dL — ABNORMAL LOW (ref 6.5–8.1)

## 2020-04-18 LAB — ECHOCARDIOGRAM COMPLETE
AR max vel: 1.74 cm2
AV Area VTI: 1.67 cm2
AV Area mean vel: 1.64 cm2
AV Mean grad: 6 mmHg
AV Peak grad: 10.9 mmHg
Ao pk vel: 1.65 m/s
Area-P 1/2: 4.37 cm2
Height: 62 in
S' Lateral: 3.36 cm
Weight: 1823.65 oz

## 2020-04-18 LAB — URINE CULTURE: Culture: NO GROWTH

## 2020-04-18 LAB — FIBRIN DERIVATIVES D-DIMER (ARMC ONLY): Fibrin derivatives D-dimer (ARMC): 819.75 ng/mL (FEU) — ABNORMAL HIGH (ref 0.00–499.00)

## 2020-04-18 LAB — PATHOLOGIST SMEAR REVIEW

## 2020-04-18 LAB — C-REACTIVE PROTEIN: CRP: 20.2 mg/dL — ABNORMAL HIGH (ref <1.0)

## 2020-04-18 MED ORDER — FUROSEMIDE 10 MG/ML IJ SOLN
40.0000 mg | Freq: Once | INTRAMUSCULAR | Status: AC
  Administered 2020-04-18: 10:00:00 40 mg via INTRAVENOUS
  Filled 2020-04-18: qty 4

## 2020-04-18 MED ORDER — METOPROLOL TARTRATE 25 MG PO TABS
25.0000 mg | ORAL_TABLET | Freq: Two times a day (BID) | ORAL | Status: DC
  Administered 2020-04-18 – 2020-04-22 (×9): 25 mg via ORAL
  Filled 2020-04-18 (×9): qty 1

## 2020-04-18 NOTE — Progress Notes (Signed)
Updated patient son, [name] about patient status. Patient talked with son on phone for a little bit. Patient did not complain of any pain or distress. He is resting in room with call bell in reach. Delivered all items sent to him from home.

## 2020-04-18 NOTE — Progress Notes (Signed)
PROGRESS NOTE    ZHION PEVEHOUSE  [id number] DOB: 1930/05/03 DOA: 04/17/2020 PCP: Tracie Harrier, MD  Brief Narrative:  RAMELO OETKEN is an 86 y.o. male with PMH significant for COPD, HTN, CAD, BPH, essential thrombocytosis and remote history of TB who was in his usual state of health until 4 days ago when he developed malaise and cough.  Patient states 2 days ago he became short of breath and was having difficulty breathing even at rest.  Patient denies any nausea vomiting or diarrhea.  Denies abdominal pain.  Denies chest pain.  His main concern is difficulty catching his breath.  Patient admits to cough, unclear whether it is productive.  Denies fevers or chills. Patient states he has been vaccinated x2 for Covid.  8/31: Patient seen and examined.  Endorses improvement in shortness of breath since admission.  No fevers over interval.  No other complaints   Assessment & Plan:   Principal Problem:   Pneumonia due to COVID-19 virus Active Problems:   Essential thrombocythemia ([place])   Hypertension   BPH without obstruction/lower urinary tract symptoms  86 year old man with CAD is admitted with Covid pneumonia, what appears to be new onset atrial fibrillation and abnormal chest CT with spiculated nodules.  Patient has known history of previous TB.  Covid pneumonia in setting of known COPD Patient is oxygenating well on 2 L nasal cannula Procalcitonin less than 0.10, antibiotic stopped Subjective shortness of breath improving Plan: Continue Decadron and remdesivir, start 04/17/2020- Oxygen as needed Given wheezing with known COPD, will place patient on Combivent and as needed albuterol inhaler Antitussives ordered  New onset atrial fibrillation that is rate controlled Has atrial fibrillation on EKG, no prior history of atrial fibrillation as far as we know. Echocardiogram within normal limits.  Normal EF.  No diastolic dysfunction Plan: Metoprolol tartrate 25 mg twice  daily, uptitrate as tolerated CHADS2 score is 4,  currently on weight-based Lovenox Echocardiogram ordered Telemetry monitoring  Abnormal chest CT with nodules Patient has spiculated nodules with adenopathy on chest CT which is concerning for malignancy However patient's son Dr [name] states he has history of TB in the past This will need to be investigated either as an Inpatient or outpatient after Covid pneumonia is improved  CAD No chest pain, no evidence for ACS Continue aspirin, metoprolol, atorvastatin, Imdur  HTN Continue losartan and metoprolol  BPH Continue Flomax   DVT prophylaxis: Lovenox, 1 mg/kg every 12 hours Code Status: Full Family Communication: Son Elchanan Bob [phone] on 04/18/2020 Disposition Plan: Status is: Inpatient  Remains inpatient appropriate because:Inpatient level of care appropriate due to severity of illness   Dispo: The patient is from: Home              Anticipated d/c is to: Home              Anticipated d/c date is: 2 days              Patient currently is not medically stable to d/c.  Resolving hypoxia secondary to COVID-19 infection.  Anticipate 1 to 2 days of additional inpatient monitoring and treatment prior to disposition planning.   Consultants:   None  Procedures:   None  Antimicrobials:   Remdesivir   Subjective: Seen and examined.  Endorses improvement in shortness of breath.  No complaints.  Objective: Vitals:   04/18/20 0031 04/18/20 0831 04/18/20 1013 04/18/20 1134  BP: 130/89 (!) 151/89 135/87 128/80  Pulse: 95 (!) 121 100  87  Resp: 18 17 16 17   Temp: 98.1 F (36.7 C) 98.2 F (36.8 C) 98.5 F (36.9 C) 97.8 F (36.6 C)  TempSrc:   Oral   SpO2: 100% 97% 97% 97%  Weight:      Height:        Intake/Output Summary (Last 24 hours) at 04/18/2020 1300 Last data filed at 04/18/2020 0857 Gross per 24 hour  Intake 993 ml  Output --  Net 993 ml   Filed Weights   04/17/20 1017 04/17/20 1915   Weight: 51.7 kg 51.7 kg    Examination:  General exam: Appears calm and comfortable, no acute distress Respiratory system: Mild scattered crackles bilaterally.  2 L nasal cannula.  Normal work of breathing  cardiovascular system: Tachycardic, irregular rhythm, no murmurs, no pedal edema Gastrointestinal system: Abdomen is nondistended, soft and nontender. No organomegaly or masses felt. Normal bowel sounds heard. Central nervous system: Alert and oriented. No focal neurological deficits. Extremities: Symmetric 5 x 5 power. Skin: No rashes, lesions or ulcers Psychiatry: Judgement and insight appear normal. Mood & affect appropriate.     Data Reviewed: I have personally reviewed following labs and imaging studies  CBC: Recent Labs  Lab 04/17/20 1006 04/18/20 0556  WBC 10.3 9.7  NEUTROABS  --  8.2*  HGB 12.0* 12.2*  HCT 35.6* 35.4*  MCV 95.4 93.9  PLT 411* 202*   Basic Metabolic Panel: Recent Labs  Lab 04/17/20 1006 04/18/20 0556  NA 131* 132*  K 4.9 5.1  CL 99 103  CO2 25 22  GLUCOSE 112* 130*  BUN 17 19  CREATININE 0.91 0.95  CALCIUM 8.3* 8.3*  MG 2.0  --    GFR: Estimated Creatinine Clearance: 40.1 mL/min (by C-G formula based on SCr of 0.95 mg/dL). Liver Function Tests: Recent Labs  Lab 04/18/20 0556  AST 33  ALT 22  ALKPHOS 80  BILITOT 1.4*  PROT 6.3*  ALBUMIN 2.8*   No results for input(s): LIPASE, AMYLASE in the last 168 hours. No results for input(s): AMMONIA in the last 168 hours. Coagulation Profile: Recent Labs  Lab 04/17/20 1908  INR 1.2   Cardiac Enzymes: No results for input(s): CKTOTAL, CKMB, CKMBINDEX, TROPONINI in the last 168 hours. BNP (last 3 results) No results for input(s): PROBNP in the last 8760 hours. HbA1C: No results for input(s): HGBA1C in the last 72 hours. CBG: No results for input(s): GLUCAP in the last 168 hours. Lipid Profile: Recent Labs    04/17/20 1459  TRIG 44   Thyroid Function Tests: No results for  input(s): TSH, T4TOTAL, FREET4, T3FREE, THYROIDAB in the last 72 hours. Anemia Panel: Recent Labs    04/17/20 1459  FERRITIN 252   Sepsis Labs: Recent Labs  Lab 04/17/20 1305 04/17/20 1459  PROCALCITON  --  <0.10  LATICACIDVEN 1.5  --     Recent Results (from the past 240 hour(s))  SARS Coronavirus 2 by RT PCR (hospital order, performed in [facility] hospital lab) Nasopharyngeal Nasopharyngeal Swab     Status: Abnormal   Collection Time: 04/17/20 12:02 PM   Specimen: Nasopharyngeal Swab  Result Value Ref Range Status   SARS Coronavirus 2 POSITIVE (A) NEGATIVE Final    Comment: RESULT CALLED TO, READ BACK BY AND VERIFIED WITH: GREG MOYER,RN 1412 04/17/2020 DB (NOTE) SARS-CoV-2 target nucleic acids are DETECTED  SARS-CoV-2 RNA is generally detectable in upper respiratory specimens  during the acute phase of infection.  Positive results are indicative  of the  presence of the identified virus, but do not rule out bacterial infection or co-infection with other pathogens not detected by the test.  Clinical correlation with patient history and  other diagnostic information is necessary to determine patient infection status.  The expected result is negative.  Fact Sheet for Patients:   StrictlyIdeas.no   Fact Sheet for Healthcare Providers:   BankingDealers.co.za    This test is not yet approved or cleared by the [place] FDA and  has been authorized for detection and/or diagnosis of SARS-CoV-2 by FDA under an Emergency Use Authorization (EUA).  This EUA will remain in effect (meaning this test  can be used) for the duration of  the COVID-19 declaration under Section 564(b)(1) of the Act, 21 U.S.C. section 360-bbb-3(b)(1), unless the authorization is terminated or revoked sooner.  Performed at [facility], [address] [place], [place] [address]   Blood Culture (routine x 2)     Status: None (Preliminary  result)   Collection Time: 04/17/20 12:02 PM   Specimen: BLOOD  Result Value Ref Range Status   Specimen Description BLOOD LEFT ARM  Final   Special Requests   Final    BOTTLES DRAWN AEROBIC AND ANAEROBIC Blood Culture adequate volume   Culture   Final    NO GROWTH < 12 HOURS Performed at [facility], [address] [place], [place] [address]    Report Status PENDING  Incomplete  Blood Culture (routine x 2)     Status: None (Preliminary result)   Collection Time: 04/17/20  7:08 PM   Specimen: BLOOD  Result Value Ref Range Status   Specimen Description BLOOD RIGHT ANTECUBITAL  Final   Special Requests   Final    BOTTLES DRAWN AEROBIC AND ANAEROBIC Blood Culture adequate volume   Culture   Final    NO GROWTH < 12 HOURS Performed at [facility], [address], [place], [place] [address]    Report Status PENDING  Incomplete         Radiology Studies: DG Chest 2 View  Result Date: 04/17/2020 CLINICAL DATA:  Chest pain, COVID positive EXAM: CHEST - 2 VIEW COMPARISON:  None FINDINGS: Multifocal opacities bilaterally. Possible loculated right pleural effusion along the lateral chest wall extending to the apex. Right paratracheal prominence. No pneumothorax. Heart size is within normal limits. Evidence of prior CABG. No acute osseous abnormality. IMPRESSION: Multifocal pulmonary opacities suspicious for pneumonia. Possible loculated right pleural effusion extending to the apex. Electronically Signed   By: Macy Mis M.D.   On: 04/17/2020 10:49   CT Chest Wo Contrast  Result Date: 04/17/2020 CLINICAL DATA:  COVID-19 positive last week, presents with worsening shortness of breath, possible effusion EXAM: CT CHEST WITHOUT CONTRAST TECHNIQUE: Multidetector CT imaging of the chest was performed following the standard protocol without IV contrast. COMPARISON:  Radiograph 04/17/2020 FINDINGS: Cardiovascular: Borderline cardiomegaly. Three-vessel coronary artery  disease. Calcifications of the aortic leaflets. Atherosclerotic plaque within the thoracic aorta. Ascending thoracic aortic dilatation to 4.1 cm central pulmonary arteries are upper limits normal caliber. Luminal evaluation of vasculature precluded in the absence of contrast media. Mediastinum/Nodes: Multiple borderline enlarged mediastinal and hilar nodes with additional more conglomerate, infiltrative soft tissue attenuation in the right hilum extending to the paramediastinal border, margins of which are difficult to fully ascertain in the absence of contrast media no. Several larger index nodes in the mediastinum include a 10 mm prevascular lymph node (2/40), 10 mm AP window lymph node (2/44), and  a 11 mm precarinal lymph node (2/44). Resulting in narrowing and segmental occlusion of several of the mid to lower lung airways on the right. Additional flattening of the lower trachea and airways, could reflect some degree of tracheal malacia. Secretions noted in the lower airways as well. Thoracic esophagus with a small hiatal hernia. No other acute abnormality. Thyroid gland with few punctate calcifications. No discrete nodules. Lungs/Pleura: Widespread multifocal areas of mixed interstitial, ground-glass and consolidative opacities with a peripheral and basilar predominance. There are additional areas of more spiculated masslike opacity present in the left upper lobe (3/31) measuring up to 2.4 x 2.3 cm in size. A separate spiculated masslike nodule seen in the left apex as well measuring 1.6 x 1.6 cm in size (3/19). Furthermore, the previously suspected effusion in the right lung demonstrates some more intermediate attenuation with lobular margins superiorly possibly reflecting a pleural based lesion given contiguity with the mediastinal and hilar soft tissue attenuation posteriorly (2/25). Additional nodular pleural thickening is seen in the left lung as well (2/45). Small fluid attenuation of fusions are noted  more inferiorly bilaterally. Upper Abdomen: Indeterminate possible cyst arising in the upper right renal fossa (2/147). Indeterminate nodule of the left adrenal gland measuring up to 1.6 cm (2/138). Musculoskeletal: Postsurgical changes from prior sternotomy with bony fusion across the sternal plate manubrium. Intact sternal sutures. Multilevel degenerative changes in the spine. Somewhat indeterminate sclerotic focus present in the left sixth rib laterally (5/52). Additional mixed sclerotic and lucent lesion seen T11 spinous process as well (6/81). IMPRESSION: 1. Widespread multifocal areas of mixed interstitial, ground-glass and consolidative opacities with a peripheral and basilar predominance, with a peripheral and basilar predominance. Findings are favored to represent multifocal pneumonia in the setting of COVID-19 positivity. 2. Bilateral spiculated masslike opacities in both upper lobes as well as more nodular intermediate to soft tissue attenuation pleural thickening in the upper lungs including a region contiguous with more diffuse conglomerate in infiltrative soft tissue attenuation along the right paramediastinal margin concerning for underlying malignancy/metastatic disease. 3. Additional mediastinal nodes concerning for metastatic adenopathy. 4. Indeterminate nodule of the left adrenal gland measuring up to 1.6 cm in size, concerning for metastatic disease. 5. Somewhat indeterminate sclerotic focus in the left sixth rib laterally. Additional mixed sclerotic and lucent lesion seen in the T11 spinous process as well. Could reflect osseous metastatic disease. 6. Indeterminate 1 cm mass in the right renal fossa. Possible cyst from the upper pole right kidney. 7. Aortic Atherosclerosis (ICD10-I70.0). 8. Ascending aortic dilatation to 4.1 cm. Recommend annual imaging followup by CTA or MRA. This recommendation follows 2010 ACCF/AHA/AATS/ACR/ASA/SCA/SCAI/SIR/STS/SVM Guidelines for the Diagnosis and Management  of Patients with Thoracic Aortic Disease. Circulation. 2010; 121: O130-Q657. Aortic aneurysm NOS (ICD10-I71.9) 9. Coronary artery calcifications are present. Please note that the presence of coronary artery calcium documents the presence of coronary artery disease, the severity of this disease and any potential stenosis cannot be assessed on this non-gated CT examination. Assessment for potential risk factor modification, dietary therapy or pharmacologic therapy may be warranted. These results were called by telephone at the time of interpretation on 04/17/2020 at 4:23 pm to provider Dr. Cherylann Banas, who verbally acknowledged these results. Electronically Signed   By: Lovena Le M.D.   On: 04/17/2020 16:24   ECHOCARDIOGRAM COMPLETE  Result Date: 04/18/2020    ECHOCARDIOGRAM REPORT   Patient Name:   MONTAE STAGER Date of Exam: 04/18/2020 Medical Rec #:  [id number]  Height:       62.0 in Accession #:    [id number]        Weight:       114.0 lb Date of Birth:  1930-03-27          BSA:          1.505 m Patient Age:    66 years          BP:           130/89 mmHg Patient Gender: M                 HR:           113 bpm. Exam Location:  [facility] Procedure: 2D Echo, Color Doppler and Cardiac Doppler Indications:     I48.91 Atrial fibrillation  History:         Patient has no prior history of Echocardiogram examinations.                  Risk Factors:Hypertension. Pt tested positive for COVID-19 on                  04/17/2020.  Sonographer:     Charmayne Sheer RDCS (AE) Referring Phys:  [id number] [place] Diagnosing Phys: Ida Rogue MD IMPRESSIONS  1. Left ventricular ejection fraction, by estimation, is 55 %. The left ventricle has normal function. The left ventricle has no regional wall motion abnormalities. Left ventricular diastolic parameters are indeterminate.  2. Right ventricular systolic function is normal. The right ventricular size is normal. There is moderately elevated pulmonary artery systolic  pressure. The estimated right ventricular systolic pressure is 19.3 mmHg.  3. The inferior vena cava is dilated in size with <50% respiratory variability, suggesting right atrial pressure of 15 mmHg. FINDINGS  Left Ventricle: Left ventricular ejection fraction, by estimation, is 55 to 60%. The left ventricle has normal function. The left ventricle has no regional wall motion abnormalities. The left ventricular internal cavity size was normal in size. There is  no left ventricular hypertrophy. Left ventricular diastolic parameters are indeterminate. Right Ventricle: The right ventricular size is normal. No increase in right ventricular wall thickness. Right ventricular systolic function is normal. There is moderately elevated pulmonary artery systolic pressure. The tricuspid regurgitant velocity is 3.19 m/s, and with an assumed right atrial pressure of 10 mmHg, the estimated right ventricular systolic pressure is 79.0 mmHg. Left Atrium: Left atrial size was normal in size. Right Atrium: Right atrial size was normal in size. Pericardium: A small pericardial effusion is present. Mitral Valve: The mitral valve is normal in structure. Normal mobility of the mitral valve leaflets. No evidence of mitral valve regurgitation. No evidence of mitral valve stenosis. MV peak gradient, 8.1 mmHg. The mean mitral valve gradient is 3.0 mmHg. Tricuspid Valve: The tricuspid valve is normal in structure. Tricuspid valve regurgitation is mild . No evidence of tricuspid stenosis. Aortic Valve: The aortic valve was not well visualized. Aortic valve regurgitation is not visualized. Mild to moderate aortic valve sclerosis/calcification is present, without any evidence of aortic stenosis. Aortic valve mean gradient measures 6.0 mmHg.  Aortic valve peak gradient measures 10.9 mmHg. Aortic valve area, by VTI measures 1.67 cm. Pulmonic Valve: The pulmonic valve was normal in structure. Pulmonic valve regurgitation is not visualized. No  evidence of pulmonic stenosis. Aorta: The aortic root is normal in size and structure. Venous: The inferior vena cava is dilated in size with less than 50% respiratory variability, suggesting right  atrial pressure of 15 mmHg. IAS/Shunts: No atrial level shunt detected by color flow Doppler.  LEFT VENTRICLE PLAX 2D LVIDd:         4.57 cm  Diastology LVIDs:         3.36 cm  LV e' lateral:   8.59 cm/s LV PW:         0.90 cm  LV E/e' lateral: 16.1 LV IVS:        0.66 cm  LV e' medial:    7.83 cm/s LVOT diam:     2.10 cm  LV E/e' medial:  17.7 LV SV:         42 LV SV Index:   28 LVOT Area:     3.46 cm  RIGHT VENTRICLE RV Basal diam:  3.32 cm LEFT ATRIUM             Index       RIGHT ATRIUM           Index LA diam:        3.30 cm 2.19 cm/m  RA Area:     16.30 cm LA Vol (A2C):   31.0 ml 20.59 ml/m RA Volume:   45.30 ml  30.09 ml/m LA Vol (A4C):   40.0 ml 26.57 ml/m LA Biplane Vol: 35.7 ml 23.72 ml/m  AORTIC VALVE                    PULMONIC VALVE AV Area (Vmax):    1.74 cm     PV Vmax:       1.06 m/s AV Area (Vmean):   1.64 cm     PV Vmean:      61.600 cm/s AV Area (VTI):     1.67 cm     PV VTI:        0.131 m AV Vmax:           165.00 cm/s  PV Peak grad:  4.5 mmHg AV Vmean:          108.000 cm/s PV Mean grad:  2.0 mmHg AV VTI:            0.249 m AV Peak Grad:      10.9 mmHg AV Mean Grad:      6.0 mmHg LVOT Vmax:         83.00 cm/s LVOT Vmean:        51.200 cm/s LVOT VTI:          0.120 m LVOT/AV VTI ratio: 0.48  AORTA Ao Root diam: 3.10 cm MITRAL VALVE                TRICUSPID VALVE MV Area (PHT): 4.37 cm     TR Peak grad:   40.7 mmHg MV Peak grad:  8.1 mmHg     TR Vmax:        319.00 cm/s MV Mean grad:  3.0 mmHg MV Vmax:       1.42 m/s     SHUNTS MV Vmean:      81.1 cm/s    Systemic VTI:  0.12 m MV Decel Time: 174 msec     Systemic Diam: 2.10 cm MV E velocity: 138.67 cm/s Ida Rogue MD Electronically signed by Ida Rogue MD Signature Date/Time: 04/18/2020/11:01:31 AM    Final         Scheduled  Meds: . aspirin EC  81 mg Oral Daily  . atorvastatin  20 mg Oral Daily  . dexamethasone (DECADRON) injection  6 mg Intravenous Q24H  . enoxaparin (LOVENOX) injection  50 mg Subcutaneous Q12H  . hydroxyurea  500 mg Oral BID  . Ipratropium-Albuterol  1 puff Inhalation Q6H  . isosorbide mononitrate  30 mg Oral Daily  . losartan  25 mg Oral Daily  . metoprolol tartrate  25 mg Oral BID  . sodium chloride flush  3 mL Intravenous Q12H  . tamsulosin  0.4 mg Oral QPC supper   Continuous Infusions: . sodium chloride    . remdesivir 100 mg in NS 100 mL 100 mg (04/18/20 0906)     LOS: 1 day    Time spent: 25 minutes    Sidney Ace, MD [facility] Pager [phone]  If 7PM-7AM, please contact night-coverage 04/18/2020, 1:00 PM

## 2020-04-18 NOTE — Progress Notes (Signed)
Spoke with [name], patient's son. Informed him of updates and patient status. Patient is resting comfortable. Does have slight cough but its dry. Will medicate with PRN meds. Bed in low position and call bell in reach.

## 2020-04-18 NOTE — Progress Notes (Signed)
*  PRELIMINARY RESULTS* Echocardiogram 2D Echocardiogram has been performed.  Clinton Gordon Clinton Gordon 04/18/2020, 9:37 AM

## 2020-04-18 NOTE — Progress Notes (Signed)
   04/18/20 0831  Assess: MEWS Score  Temp 98.2 F (36.8 C)  BP (!) 151/89  Pulse Rate (!) 121  Resp 17  Level of Consciousness Alert  SpO2 97 %  O2 Device Nasal Cannula  O2 Flow Rate (L/min) 4 L/min  Assess: MEWS Score  MEWS Temp 0  MEWS Systolic 0  MEWS Pulse 2  MEWS RR 0  MEWS LOC 0  MEWS Score 2  MEWS Score Color Yellow  Assess: if the MEWS score is Yellow or Red  Were vital signs taken at a resting state? Yes  Focused Assessment No change from prior assessment  Early Detection of Sepsis Score *See Row Information* Medium  MEWS guidelines implemented *See Row Information* Yes  Treat  MEWS Interventions Administered scheduled meds/treatments;Administered prn meds/treatments  Pain Scale 0-10  Pain Score 0  Escalate  MEWS: Escalate Yellow: discuss with charge nurse/RN and consider discussing with provider and RRT  Notify: Charge Nurse/RN  Name of Charge Nurse/RN Notified Caryl Pina RN  Date Charge Nurse/RN Notified 04/18/20  Time Charge Nurse/RN Notified 0908  Document  Patient Outcome Stabilized after interventions  Progress note created (see row info) Yes   Administered scheduled meds this morning to patient. He is resting in bed. Has no complaints of pain or distress. Bed in low position and call bell in reach. O2 97% 4L [place]. Will begin to slowly titrate patient down today.

## 2020-04-19 DIAGNOSIS — Z8611 Personal history of tuberculosis: Secondary | ICD-10-CM | POA: Diagnosis not present

## 2020-04-19 DIAGNOSIS — I10 Essential (primary) hypertension: Secondary | ICD-10-CM | POA: Diagnosis not present

## 2020-04-19 DIAGNOSIS — R0602 Shortness of breath: Secondary | ICD-10-CM | POA: Diagnosis present

## 2020-04-19 DIAGNOSIS — A4189 Other specified sepsis: Secondary | ICD-10-CM | POA: Diagnosis not present

## 2020-04-19 DIAGNOSIS — A1801 Tuberculosis of spine: Secondary | ICD-10-CM | POA: Diagnosis not present

## 2020-04-19 DIAGNOSIS — I77810 Thoracic aortic ectasia: Secondary | ICD-10-CM | POA: Diagnosis not present

## 2020-04-19 DIAGNOSIS — A0839 Other viral enteritis: Secondary | ICD-10-CM | POA: Diagnosis not present

## 2020-04-19 DIAGNOSIS — E785 Hyperlipidemia, unspecified: Secondary | ICD-10-CM | POA: Diagnosis not present

## 2020-04-19 DIAGNOSIS — D473 Essential (hemorrhagic) thrombocythemia: Secondary | ICD-10-CM

## 2020-04-19 DIAGNOSIS — N400 Enlarged prostate without lower urinary tract symptoms: Secondary | ICD-10-CM

## 2020-04-19 DIAGNOSIS — Z951 Presence of aortocoronary bypass graft: Secondary | ICD-10-CM | POA: Diagnosis not present

## 2020-04-19 DIAGNOSIS — I272 Pulmonary hypertension, unspecified: Secondary | ICD-10-CM | POA: Diagnosis not present

## 2020-04-19 DIAGNOSIS — Z7982 Long term (current) use of aspirin: Secondary | ICD-10-CM | POA: Diagnosis not present

## 2020-04-19 DIAGNOSIS — M7981 Nontraumatic hematoma of soft tissue: Secondary | ICD-10-CM | POA: Diagnosis not present

## 2020-04-19 DIAGNOSIS — J9601 Acute respiratory failure with hypoxia: Secondary | ICD-10-CM

## 2020-04-19 DIAGNOSIS — J189 Pneumonia, unspecified organism: Secondary | ICD-10-CM

## 2020-04-19 DIAGNOSIS — Z79899 Other long term (current) drug therapy: Secondary | ICD-10-CM | POA: Diagnosis not present

## 2020-04-19 DIAGNOSIS — I4891 Unspecified atrial fibrillation: Secondary | ICD-10-CM | POA: Diagnosis not present

## 2020-04-19 DIAGNOSIS — U071 COVID-19: Secondary | ICD-10-CM | POA: Diagnosis not present

## 2020-04-19 DIAGNOSIS — D62 Acute posthemorrhagic anemia: Secondary | ICD-10-CM | POA: Diagnosis not present

## 2020-04-19 DIAGNOSIS — J1282 Pneumonia due to coronavirus disease 2019: Secondary | ICD-10-CM | POA: Diagnosis not present

## 2020-04-19 DIAGNOSIS — Z515 Encounter for palliative care: Secondary | ICD-10-CM | POA: Diagnosis not present

## 2020-04-19 DIAGNOSIS — J440 Chronic obstructive pulmonary disease with acute lower respiratory infection: Secondary | ICD-10-CM | POA: Diagnosis not present

## 2020-04-19 DIAGNOSIS — I2510 Atherosclerotic heart disease of native coronary artery without angina pectoris: Secondary | ICD-10-CM | POA: Diagnosis not present

## 2020-04-19 DIAGNOSIS — Z66 Do not resuscitate: Secondary | ICD-10-CM | POA: Diagnosis not present

## 2020-04-19 DIAGNOSIS — R578 Other shock: Secondary | ICD-10-CM | POA: Diagnosis not present

## 2020-04-19 LAB — COMPREHENSIVE METABOLIC PANEL
ALT: 23 U/L (ref 0–44)
AST: 38 U/L (ref 15–41)
Albumin: 2.9 g/dL — ABNORMAL LOW (ref 3.5–5.0)
Alkaline Phosphatase: 83 U/L (ref 38–126)
Anion gap: 9 (ref 5–15)
BUN: 31 mg/dL — ABNORMAL HIGH (ref 8–23)
CO2: 24 mmol/L (ref 22–32)
Calcium: 8.5 mg/dL — ABNORMAL LOW (ref 8.9–10.3)
Chloride: 97 mmol/L — ABNORMAL LOW (ref 98–111)
Creatinine, Ser: 1.06 mg/dL (ref 0.61–1.24)
GFR calc Af Amer: >60 mL/min (ref >60)
GFR calc non Af Amer: >60 mL/min (ref >60)
Glucose, Bld: 110 mg/dL — ABNORMAL HIGH (ref 70–99)
Potassium: 4.8 mmol/L (ref 3.5–5.1)
Sodium: 130 mmol/L — ABNORMAL LOW (ref 135–145)
Total Bilirubin: 1.5 mg/dL — ABNORMAL HIGH (ref 0.3–1.2)
Total Protein: 6.5 g/dL (ref 6.5–8.1)

## 2020-04-19 LAB — CBC WITH DIFFERENTIAL/PLATELET
Abs Immature Granulocytes: 0.31 10*3/uL — ABNORMAL HIGH (ref 0.00–0.07)
Basophils Absolute: 0 10*3/uL (ref 0.0–0.1)
Basophils Relative: 0 %
Eosinophils Absolute: 0 10*3/uL (ref 0.0–0.5)
Eosinophils Relative: 0 %
HCT: 37.1 % — ABNORMAL LOW (ref 39.0–52.0)
Hemoglobin: 12.5 g/dL — ABNORMAL LOW (ref 13.0–17.0)
Immature Granulocytes: 2 %
Lymphocytes Relative: 4 %
Lymphs Abs: 0.7 10*3/uL (ref 0.7–4.0)
MCH: 32.1 pg (ref 26.0–34.0)
MCHC: 33.7 g/dL (ref 30.0–36.0)
MCV: 95.4 fL (ref 80.0–100.0)
Monocytes Absolute: 1.4 10*3/uL — ABNORMAL HIGH (ref 0.1–1.0)
Monocytes Relative: 8 %
Neutro Abs: 14.5 10*3/uL — ABNORMAL HIGH (ref 1.7–7.7)
Neutrophils Relative %: 86 %
Platelets: 574 10*3/uL — ABNORMAL HIGH (ref 150–400)
RBC: 3.89 MIL/uL — ABNORMAL LOW (ref 4.22–5.81)
RDW: 29.3 % — ABNORMAL HIGH (ref 11.5–15.5)
Smear Review: NORMAL
WBC: 16.9 10*3/uL — ABNORMAL HIGH (ref 4.0–10.5)
nRBC: 0 % (ref 0.0–0.2)

## 2020-04-19 LAB — FERRITIN: Ferritin: 394 ng/mL — ABNORMAL HIGH (ref 24–336)

## 2020-04-19 LAB — PHOSPHORUS: Phosphorus: 3.5 mg/dL (ref 2.5–4.6)

## 2020-04-19 LAB — MAGNESIUM: Magnesium: 2 mg/dL (ref 1.7–2.4)

## 2020-04-19 LAB — FIBRIN DERIVATIVES D-DIMER (ARMC ONLY)
Fibrin derivatives D-dimer (ARMC): 812.26 ng/mL (FEU) — ABNORMAL HIGH (ref 0.00–499.00)
Fibrin derivatives D-dimer (ARMC): 755.02 ng/mL (FEU) — ABNORMAL HIGH (ref 0.00–499.00)

## 2020-04-19 LAB — C-REACTIVE PROTEIN: CRP: 12.2 mg/dL — ABNORMAL HIGH (ref <1.0)

## 2020-04-19 LAB — LACTATE DEHYDROGENASE: LDH: 364 U/L — ABNORMAL HIGH (ref 98–192)

## 2020-04-19 NOTE — Progress Notes (Signed)
Took over pt care at this time, pt resting in bed, no complaints, no needs at this time

## 2020-04-19 NOTE — Progress Notes (Signed)
PROGRESS NOTE    Clinton Gordon  [id number] DOB: 09/21/1929 DOA: 04/17/2020 PCP: Tracie Harrier, MD     Brief Narrative:  Clinton Gordon is an 86 y.o. male with PMH significant for COPD, HTN, CAD, BPH, essential thrombocytosis and remote history of TB who was in his usual state of health until 4 days ago when he developed malaise and cough.  Patient states 2 days ago he became short of breath and was having difficulty breathing even at rest.  Patient denies any nausea vomiting or diarrhea.  Denies abdominal pain.  Denies chest pain.  His main concern is difficulty catching his breath.  Patient admits to cough, unclear whether it is productive.  Denies fevers or chills. Patient states he has been vaccinated x2 for Covid.  ED Course:  The patient was noted to be afebrile with tachycardia.  He was somewhat hypoxic on room air but corrected to 95% with 2 L.  Chest x-ray showed multifocal pulmonary opacities consistent with multifocal pneumonia as well as a possible loculated left-sided effusion.  Covid test was positive.  Patient was started on steroids and remdesivir and admitted for ongoing    Subjective: Afebrile   Assessment & Plan: Covid vaccination;   Principal Problem:   Pneumonia due to COVID-19 virus Active Problems:   Essential thrombocythemia ([place])   Hypertension   BPH without obstruction/lower urinary tract symptoms  Acute respiratory failure with hypoxia/COPD COVID-19 Labs  Recent Labs    04/17/20 1459 04/18/20 0556  FERRITIN 252  --   LDH 170  --   CRP 10.4* 20.2*    Lab Results  Component Value Date   SARSCOV2NAA POSITIVE (A) 04/17/2020   [place] Not Detected 07/19/2019     Covid pneumonia in setting of known COPD Patient is oxygenating well on 2 L nasal cannula Procalcitonin less than 0.10, will discontinue azithromycin and ceftriaxone Continue Decadron and remdesivir Oxygen as needed Given wheezing with known COPD, will place patient  on Combivent and as needed albuterol inhaler Antitussives ordered  New onset atrial fibrillation that is rate controlled Has atrial fibrillation on EKG, no prior history of atrial fibrillation as far as we know. Continue metoprolol 25 which does seem to be controlling his rate, can increase as warranted CHADS2 score is 4, will start patient on IV heparin for now, can switch over to Eliquis as warranted Echocardiogram ordered Keep patient on telemetry  Abnormal chest CT with nodules Patient has spiculated nodules with adenopathy on chest CT which is concerning for malignancy However patient's son Dr [name] states he has history of TB in the past This will need to be investigated either as an Inpatient or outpatient after Covid pneumonia is improved  CAD No chest pain, no evidence for ACS Continue aspirin, metoprolol, atorvastatin, Imdur  HTN Continue losartan and metoprolol  BPH Continue Flomax   DVT prophylaxis: Lovenox Code Status: Full Family Communication:  Status is: Inpatient    Dispo: The patient is from: Home              Anticipated d/c is to: Home              Anticipated d/c date is: 9/5              Patient currently unstable      Consultants:    Procedures/Significant Events:    I have personally reviewed and interpreted all radiology studies and my findings are as above.  VENTILATOR SETTINGS:    Cultures  Antimicrobials: Anti-infectives (From admission, onward)   Start     Ordered Stop   04/18/20 1000  remdesivir 100 mg in sodium chloride 0.9 % 100 mL IVPB       "Followed by" Linked Group Details   04/17/20 1443 04/22/20 0959   04/17/20 1600  remdesivir 200 mg in sodium chloride 0.9% 250 mL IVPB       "Followed by" Linked Group Details   04/17/20 1443 04/17/20 1659   04/17/20 1230  cefTRIAXone (ROCEPHIN) 2 g in sodium chloride 0.9 % 100 mL IVPB  Status:  Discontinued        04/17/20 1218 04/17/20 1725   04/17/20 1230   azithromycin (ZITHROMAX) 500 mg in sodium chloride 0.9 % 250 mL IVPB  Status:  Discontinued        04/17/20 1218 04/17/20 1725       Devices    LINES / TUBES:      Continuous Infusions: . sodium chloride    . remdesivir 100 mg in NS 100 mL Stopped (04/18/20 0930)     Objective: Vitals:   04/19/20 0200 04/19/20 0207 04/19/20 0321 04/19/20 0747  BP:   127/88 (!) 150/87  Pulse: 92  95 98  Resp: (!) 28 (!) 22 (!) 22 (!) 22  Temp:   97.7 F (36.5 C) (!) 96.9 F (36.1 C)  TempSrc:   Oral Axillary  SpO2: 98%  99% 98%  Weight:      Height:        Intake/Output Summary (Last 24 hours) at 04/19/2020 0915 Last data filed at 04/19/2020 0856 Gross per 24 hour  Intake --  Output 1350 ml  Net -1350 ml   Filed Weights   04/17/20 1017 04/17/20 1915  Weight: 51.7 kg 51.7 kg    Examination:  General: A/O x4, positive acute respiratory distress Eyes: negative scleral hemorrhage, negative anisocoria, negative icterus ENT: Negative Runny nose, negative gingival bleeding, Neck:  Negative scars, masses, torticollis, lymphadenopathy, JVD Lungs: decreased breath sounds bilaterally without wheezes or crackles Cardiovascular: Regular rate and rhythm without murmur gallop or rub normal S1 and S2 Abdomen: negative abdominal pain, nondistended, positive soft, bowel sounds, no rebound, no ascites, no appreciable mass Extremities: No significant cyanosis, clubbing, or edema bilateral lower extremities Skin: Negative rashes, lesions, ulcers Psychiatric:  Negative depression, negative anxiety, negative fatigue, negative mania  Central nervous system:  Cranial nerves II through XII intact, tongue/uvula midline, all extremities muscle strength 5/5, sensation intact throughout, negative dysarthria, negative expressive aphasia, negative receptive aphasia.  .     Data Reviewed: Care during the described time interval was provided by me .  I have reviewed this patient's available data, including  medical history, events of note, physical examination, and all test results as part of my evaluation.  CBC: Recent Labs  Lab 04/17/20 1006 04/18/20 0556 04/19/20 0456  WBC 10.3 9.7 16.9*  NEUTROABS  --  8.2* 14.5*  HGB 12.0* 12.2* 12.5*  HCT 35.6* 35.4* 37.1*  MCV 95.4 93.9 95.4  PLT 411* 420* 270*   Basic Metabolic Panel: Recent Labs  Lab 04/17/20 1006 04/18/20 0556 04/19/20 0456  NA 131* 132* 130*  K 4.9 5.1 4.8  CL 99 103 97*  CO2 25 22 24   GLUCOSE 112* 130* 110*  BUN 17 19 31*  CREATININE 0.91 0.95 1.06  CALCIUM 8.3* 8.3* 8.5*  MG 2.0  --   --    GFR: Estimated Creatinine Clearance: 35.9 mL/min (by C-G formula based on SCr  of 1.06 mg/dL). Liver Function Tests: Recent Labs  Lab 04/18/20 0556 04/19/20 0456  AST 33 38  ALT 22 23  ALKPHOS 80 83  BILITOT 1.4* 1.5*  PROT 6.3* 6.5  ALBUMIN 2.8* 2.9*   No results for input(s): LIPASE, AMYLASE in the last 168 hours. No results for input(s): AMMONIA in the last 168 hours. Coagulation Profile: Recent Labs  Lab 04/17/20 1908  INR 1.2   Cardiac Enzymes: No results for input(s): CKTOTAL, CKMB, CKMBINDEX, TROPONINI in the last 168 hours. BNP (last 3 results) No results for input(s): PROBNP in the last 8760 hours. HbA1C: No results for input(s): HGBA1C in the last 72 hours. CBG: No results for input(s): GLUCAP in the last 168 hours. Lipid Profile: Recent Labs    04/17/20 1459  TRIG 44   Thyroid Function Tests: No results for input(s): TSH, T4TOTAL, FREET4, T3FREE, THYROIDAB in the last 72 hours. Anemia Panel: Recent Labs    04/17/20 1459  FERRITIN 252   Sepsis Labs: Recent Labs  Lab 04/17/20 1305 04/17/20 1459  PROCALCITON  --  <0.10  LATICACIDVEN 1.5  --     Recent Results (from the past 240 hour(s))  SARS Coronavirus 2 by RT PCR (hospital order, performed in [facility] hospital lab) Nasopharyngeal Nasopharyngeal Swab     Status: Abnormal   Collection Time: 04/17/20 12:02 PM   Specimen:  Nasopharyngeal Swab  Result Value Ref Range Status   SARS Coronavirus 2 POSITIVE (A) NEGATIVE Final    Comment: RESULT CALLED TO, READ BACK BY AND VERIFIED WITH: GREG MOYER,RN 1412 04/17/2020 DB (NOTE) SARS-CoV-2 target nucleic acids are DETECTED  SARS-CoV-2 RNA is generally detectable in upper respiratory specimens  during the acute phase of infection.  Positive results are indicative  of the presence of the identified virus, but do not rule out bacterial infection or co-infection with other pathogens not detected by the test.  Clinical correlation with patient history and  other diagnostic information is necessary to determine patient infection status.  The expected result is negative.  Fact Sheet for Patients:   StrictlyIdeas.no   Fact Sheet for Healthcare Providers:   BankingDealers.co.za    This test is not yet approved or cleared by the [place] FDA and  has been authorized for detection and/or diagnosis of SARS-CoV-2 by FDA under an Emergency Use Authorization (EUA).  This EUA will remain in effect (meaning this test  can be used) for the duration of  the COVID-19 declaration under Section 564(b)(1) of the Act, 21 U.S.C. section 360-bbb-3(b)(1), unless the authorization is terminated or revoked sooner.  Performed at [facility], [address] [place], [place] [address]   Blood Culture (routine x 2)     Status: None (Preliminary result)   Collection Time: 04/17/20 12:02 PM   Specimen: BLOOD  Result Value Ref Range Status   Specimen Description BLOOD LEFT ARM  Final   Special Requests   Final    BOTTLES DRAWN AEROBIC AND ANAEROBIC Blood Culture adequate volume   Culture   Final    NO GROWTH 2 DAYS Performed at [facility], [address] [place], [place] [address]    Report Status PENDING  Incomplete  Urine culture     Status: None   Collection Time: 04/17/20  1:05 PM   Specimen: In/Out Cath  Urine  Result Value Ref Range Status   Specimen Description   Final    IN/OUT CATH URINE Performed at [facility], [address] [place], [place] [address]    Special Requests   Final    NONE Performed at [facility], [address] [place], [place] [address]    Culture   Final    NO GROWTH Performed at [facility], [place] [address] [place], [place] [address]    Report Status 04/18/2020 FINAL  Final  Blood Culture (routine x 2)     Status: None (Preliminary result)   Collection Time: 04/17/20  7:08 PM   Specimen: BLOOD  Result Value Ref Range Status   Specimen Description BLOOD RIGHT ANTECUBITAL  Final   Special Requests   Final    BOTTLES DRAWN AEROBIC AND ANAEROBIC Blood Culture adequate volume   Culture   Final    NO GROWTH 2 DAYS Performed at [facility], [address], [place], [place] [address]    Report Status PENDING  Incomplete         Radiology Studies: DG Chest 2 View  Result Date: 04/17/2020 CLINICAL DATA:  Chest pain, COVID positive EXAM: CHEST - 2 VIEW COMPARISON:  None FINDINGS: Multifocal opacities bilaterally. Possible loculated right pleural effusion along the lateral chest wall extending to the apex. Right paratracheal prominence. No pneumothorax. Heart size is within normal limits. Evidence of prior CABG. No acute osseous abnormality. IMPRESSION: Multifocal pulmonary opacities suspicious for pneumonia. Possible loculated right pleural effusion extending to the apex. Electronically Signed   By: Macy Mis M.D.   On: 04/17/2020 10:49   CT Chest Wo Contrast  Result Date: 04/17/2020 CLINICAL DATA:  COVID-19 positive last week, presents with worsening shortness of breath, possible effusion EXAM: CT CHEST WITHOUT CONTRAST TECHNIQUE: Multidetector CT imaging of the chest was performed following the standard protocol without IV contrast. COMPARISON:  Radiograph 04/17/2020 FINDINGS: Cardiovascular: Borderline  cardiomegaly. Three-vessel coronary artery disease. Calcifications of the aortic leaflets. Atherosclerotic plaque within the thoracic aorta. Ascending thoracic aortic dilatation to 4.1 cm central pulmonary arteries are upper limits normal caliber. Luminal evaluation of vasculature precluded in the absence of contrast media. Mediastinum/Nodes: Multiple borderline enlarged mediastinal and hilar nodes with additional more conglomerate, infiltrative soft tissue attenuation in the right hilum extending to the paramediastinal border, margins of which are difficult to fully ascertain in the absence of contrast media no. Several larger index nodes in the mediastinum include a 10 mm prevascular lymph node (2/40), 10 mm AP window lymph node (2/44), and a 11 mm precarinal lymph node (2/44). Resulting in narrowing and segmental occlusion of several of the mid to lower lung airways on the right. Additional flattening of the lower trachea and airways, could reflect some degree of tracheal malacia. Secretions noted in the lower airways as well. Thoracic esophagus with a small hiatal hernia. No other acute abnormality. Thyroid gland with few punctate calcifications. No discrete nodules. Lungs/Pleura: Widespread multifocal areas of mixed interstitial, ground-glass and consolidative opacities with a peripheral and basilar predominance. There are additional areas of more spiculated masslike opacity present in the left upper lobe (3/31) measuring up to 2.4 x 2.3 cm in size. A separate spiculated masslike nodule seen in the left apex as well measuring 1.6 x 1.6 cm in size (3/19). Furthermore, the previously suspected effusion in the right lung demonstrates some more intermediate attenuation with lobular margins superiorly possibly reflecting a pleural based lesion given contiguity with the mediastinal and hilar soft tissue attenuation posteriorly (2/25). Additional nodular pleural thickening is seen in the left lung as well (2/45).  Small fluid attenuation of fusions are  noted more inferiorly bilaterally. Upper Abdomen: Indeterminate possible cyst arising in the upper right renal fossa (2/147). Indeterminate nodule of the left adrenal gland measuring up to 1.6 cm (2/138). Musculoskeletal: Postsurgical changes from prior sternotomy with bony fusion across the sternal plate manubrium. Intact sternal sutures. Multilevel degenerative changes in the spine. Somewhat indeterminate sclerotic focus present in the left sixth rib laterally (5/52). Additional mixed sclerotic and lucent lesion seen T11 spinous process as well (6/81). IMPRESSION: 1. Widespread multifocal areas of mixed interstitial, ground-glass and consolidative opacities with a peripheral and basilar predominance, with a peripheral and basilar predominance. Findings are favored to represent multifocal pneumonia in the setting of COVID-19 positivity. 2. Bilateral spiculated masslike opacities in both upper lobes as well as more nodular intermediate to soft tissue attenuation pleural thickening in the upper lungs including a region contiguous with more diffuse conglomerate in infiltrative soft tissue attenuation along the right paramediastinal margin concerning for underlying malignancy/metastatic disease. 3. Additional mediastinal nodes concerning for metastatic adenopathy. 4. Indeterminate nodule of the left adrenal gland measuring up to 1.6 cm in size, concerning for metastatic disease. 5. Somewhat indeterminate sclerotic focus in the left sixth rib laterally. Additional mixed sclerotic and lucent lesion seen in the T11 spinous process as well. Could reflect osseous metastatic disease. 6. Indeterminate 1 cm mass in the right renal fossa. Possible cyst from the upper pole right kidney. 7. Aortic Atherosclerosis (ICD10-I70.0). 8. Ascending aortic dilatation to 4.1 cm. Recommend annual imaging followup by CTA or MRA. This recommendation follows 2010 ACCF/AHA/AATS/ACR/ASA/SCA/SCAI/SIR/STS/SVM  Guidelines for the Diagnosis and Management of Patients with Thoracic Aortic Disease. Circulation. 2010; 121: K270-W237. Aortic aneurysm NOS (ICD10-I71.9) 9. Coronary artery calcifications are present. Please note that the presence of coronary artery calcium documents the presence of coronary artery disease, the severity of this disease and any potential stenosis cannot be assessed on this non-gated CT examination. Assessment for potential risk factor modification, dietary therapy or pharmacologic therapy may be warranted. These results were called by telephone at the time of interpretation on 04/17/2020 at 4:23 pm to provider Dr. Cherylann Banas, who verbally acknowledged these results. Electronically Signed   By: Lovena Le M.D.   On: 04/17/2020 16:24   ECHOCARDIOGRAM COMPLETE  Result Date: 04/18/2020    ECHOCARDIOGRAM REPORT   Patient Name:   Clinton Gordon Date of Exam: 04/18/2020 Medical Rec #:  [id number]         Height:       62.0 in Accession #:    [id number]        Weight:       114.0 lb Date of Birth:  1930/04/17          BSA:          1.505 m Patient Age:    60 years          BP:           130/89 mmHg Patient Gender: M                 HR:           113 bpm. Exam Location:  [facility] Procedure: 2D Echo, Color Doppler and Cardiac Doppler Indications:     I48.91 Atrial fibrillation  History:         Patient has no prior history of Echocardiogram examinations.                  Risk Factors:Hypertension. Pt tested positive for COVID-19 on  04/17/2020.  Sonographer:     Charmayne Sheer RDCS (AE) Referring Phys:  [id number] [place] Diagnosing Phys: Ida Rogue MD IMPRESSIONS  1. Left ventricular ejection fraction, by estimation, is 55 %. The left ventricle has normal function. The left ventricle has no regional wall motion abnormalities. Left ventricular diastolic parameters are indeterminate.  2. Right ventricular systolic function is normal. The right ventricular size is normal. There is  moderately elevated pulmonary artery systolic pressure. The estimated right ventricular systolic pressure is 81.0 mmHg.  3. The inferior vena cava is dilated in size with <50% respiratory variability, suggesting right atrial pressure of 15 mmHg. FINDINGS  Left Ventricle: Left ventricular ejection fraction, by estimation, is 55 to 60%. The left ventricle has normal function. The left ventricle has no regional wall motion abnormalities. The left ventricular internal cavity size was normal in size. There is  no left ventricular hypertrophy. Left ventricular diastolic parameters are indeterminate. Right Ventricle: The right ventricular size is normal. No increase in right ventricular wall thickness. Right ventricular systolic function is normal. There is moderately elevated pulmonary artery systolic pressure. The tricuspid regurgitant velocity is 3.19 m/s, and with an assumed right atrial pressure of 10 mmHg, the estimated right ventricular systolic pressure is 17.5 mmHg. Left Atrium: Left atrial size was normal in size. Right Atrium: Right atrial size was normal in size. Pericardium: A small pericardial effusion is present. Mitral Valve: The mitral valve is normal in structure. Normal mobility of the mitral valve leaflets. No evidence of mitral valve regurgitation. No evidence of mitral valve stenosis. MV peak gradient, 8.1 mmHg. The mean mitral valve gradient is 3.0 mmHg. Tricuspid Valve: The tricuspid valve is normal in structure. Tricuspid valve regurgitation is mild . No evidence of tricuspid stenosis. Aortic Valve: The aortic valve was not well visualized. Aortic valve regurgitation is not visualized. Mild to moderate aortic valve sclerosis/calcification is present, without any evidence of aortic stenosis. Aortic valve mean gradient measures 6.0 mmHg.  Aortic valve peak gradient measures 10.9 mmHg. Aortic valve area, by VTI measures 1.67 cm. Pulmonic Valve: The pulmonic valve was normal in structure. Pulmonic  valve regurgitation is not visualized. No evidence of pulmonic stenosis. Aorta: The aortic root is normal in size and structure. Venous: The inferior vena cava is dilated in size with less than 50% respiratory variability, suggesting right atrial pressure of 15 mmHg. IAS/Shunts: No atrial level shunt detected by color flow Doppler.  LEFT VENTRICLE PLAX 2D LVIDd:         4.57 cm  Diastology LVIDs:         3.36 cm  LV e' lateral:   8.59 cm/s LV PW:         0.90 cm  LV E/e' lateral: 16.1 LV IVS:        0.66 cm  LV e' medial:    7.83 cm/s LVOT diam:     2.10 cm  LV E/e' medial:  17.7 LV SV:         42 LV SV Index:   28 LVOT Area:     3.46 cm  RIGHT VENTRICLE RV Basal diam:  3.32 cm LEFT ATRIUM             Index       RIGHT ATRIUM           Index LA diam:        3.30 cm 2.19 cm/m  RA Area:     16.30 cm LA Vol (A2C):  31.0 ml 20.59 ml/m RA Volume:   45.30 ml  30.09 ml/m LA Vol (A4C):   40.0 ml 26.57 ml/m LA Biplane Vol: 35.7 ml 23.72 ml/m  AORTIC VALVE                    PULMONIC VALVE AV Area (Vmax):    1.74 cm     PV Vmax:       1.06 m/s AV Area (Vmean):   1.64 cm     PV Vmean:      61.600 cm/s AV Area (VTI):     1.67 cm     PV VTI:        0.131 m AV Vmax:           165.00 cm/s  PV Peak grad:  4.5 mmHg AV Vmean:          108.000 cm/s PV Mean grad:  2.0 mmHg AV VTI:            0.249 m AV Peak Grad:      10.9 mmHg AV Mean Grad:      6.0 mmHg LVOT Vmax:         83.00 cm/s LVOT Vmean:        51.200 cm/s LVOT VTI:          0.120 m LVOT/AV VTI ratio: 0.48  AORTA Ao Root diam: 3.10 cm MITRAL VALVE                TRICUSPID VALVE MV Area (PHT): 4.37 cm     TR Peak grad:   40.7 mmHg MV Peak grad:  8.1 mmHg     TR Vmax:        319.00 cm/s MV Mean grad:  3.0 mmHg MV Vmax:       1.42 m/s     SHUNTS MV Vmean:      81.1 cm/s    Systemic VTI:  0.12 m MV Decel Time: 174 msec     Systemic Diam: 2.10 cm MV E velocity: 138.67 cm/s Ida Rogue MD Electronically signed by Ida Rogue MD Signature Date/Time:  04/18/2020/11:01:31 AM    Final         Scheduled Meds: . aspirin EC  81 mg Oral Daily  . atorvastatin  20 mg Oral Daily  . dexamethasone (DECADRON) injection  6 mg Intravenous Q24H  . enoxaparin (LOVENOX) injection  50 mg Subcutaneous Q12H  . hydroxyurea  500 mg Oral BID  . Ipratropium-Albuterol  1 puff Inhalation Q6H  . isosorbide mononitrate  30 mg Oral Daily  . losartan  25 mg Oral Daily  . metoprolol tartrate  25 mg Oral BID  . sodium chloride flush  3 mL Intravenous Q12H  . tamsulosin  0.4 mg Oral QPC supper   Continuous Infusions: . sodium chloride    . remdesivir 100 mg in NS 100 mL Stopped (04/18/20 0930)     LOS: 2 days    Time spent:40 min    Dmani Mizer, Geraldo Docker, MD [facility] Pager [phone]  If 7PM-7AM, please contact night-coverage [email or web address] Password [email or web address] 04/19/2020, 9:15 AM

## 2020-04-20 DIAGNOSIS — N400 Enlarged prostate without lower urinary tract symptoms: Secondary | ICD-10-CM | POA: Diagnosis not present

## 2020-04-20 DIAGNOSIS — R0602 Shortness of breath: Secondary | ICD-10-CM | POA: Diagnosis not present

## 2020-04-20 DIAGNOSIS — J189 Pneumonia, unspecified organism: Secondary | ICD-10-CM | POA: Diagnosis not present

## 2020-04-20 DIAGNOSIS — D473 Essential (hemorrhagic) thrombocythemia: Secondary | ICD-10-CM | POA: Diagnosis not present

## 2020-04-20 DIAGNOSIS — U071 COVID-19: Secondary | ICD-10-CM | POA: Diagnosis not present

## 2020-04-20 LAB — COMPREHENSIVE METABOLIC PANEL
ALT: 24 U/L (ref 0–44)
AST: 33 U/L (ref 15–41)
Albumin: 2.6 g/dL — ABNORMAL LOW (ref 3.5–5.0)
Alkaline Phosphatase: 75 U/L (ref 38–126)
Anion gap: 6 (ref 5–15)
BUN: 30 mg/dL — ABNORMAL HIGH (ref 8–23)
CO2: 25 mmol/L (ref 22–32)
Calcium: 8.3 mg/dL — ABNORMAL LOW (ref 8.9–10.3)
Chloride: 100 mmol/L (ref 98–111)
Creatinine, Ser: 0.8 mg/dL (ref 0.61–1.24)
GFR calc Af Amer: >60 mL/min (ref >60)
GFR calc non Af Amer: >60 mL/min (ref >60)
Glucose, Bld: 106 mg/dL — ABNORMAL HIGH (ref 70–99)
Potassium: 4.4 mmol/L (ref 3.5–5.1)
Sodium: 131 mmol/L — ABNORMAL LOW (ref 135–145)
Total Bilirubin: 1.3 mg/dL — ABNORMAL HIGH (ref 0.3–1.2)
Total Protein: 5.8 g/dL — ABNORMAL LOW (ref 6.5–8.1)

## 2020-04-20 LAB — MAGNESIUM: Magnesium: 1.9 mg/dL (ref 1.7–2.4)

## 2020-04-20 LAB — FERRITIN: Ferritin: 315 ng/mL (ref 24–336)

## 2020-04-20 LAB — CBC WITH DIFFERENTIAL/PLATELET
Abs Immature Granulocytes: 0.54 10*3/uL — ABNORMAL HIGH (ref 0.00–0.07)
Basophils Absolute: 0 10*3/uL (ref 0.0–0.1)
Basophils Relative: 0 %
Eosinophils Absolute: 0 10*3/uL (ref 0.0–0.5)
Eosinophils Relative: 0 %
HCT: 36.7 % — ABNORMAL LOW (ref 39.0–52.0)
Hemoglobin: 12.5 g/dL — ABNORMAL LOW (ref 13.0–17.0)
Immature Granulocytes: 3 %
Lymphocytes Relative: 5 %
Lymphs Abs: 0.8 10*3/uL (ref 0.7–4.0)
MCH: 32 pg (ref 26.0–34.0)
MCHC: 34.1 g/dL (ref 30.0–36.0)
MCV: 93.9 fL (ref 80.0–100.0)
Monocytes Absolute: 1.7 10*3/uL — ABNORMAL HIGH (ref 0.1–1.0)
Monocytes Relative: 10 %
Neutro Abs: 14 10*3/uL — ABNORMAL HIGH (ref 1.7–7.7)
Neutrophils Relative %: 82 %
Platelets: 605 10*3/uL — ABNORMAL HIGH (ref 150–400)
RBC: 3.91 MIL/uL — ABNORMAL LOW (ref 4.22–5.81)
RDW: 29.1 % — ABNORMAL HIGH (ref 11.5–15.5)
WBC: 17 10*3/uL — ABNORMAL HIGH (ref 4.0–10.5)
nRBC: 0 % (ref 0.0–0.2)

## 2020-04-20 LAB — FIBRIN DERIVATIVES D-DIMER (ARMC ONLY): Fibrin derivatives D-dimer (ARMC): 536.43 ng/mL (FEU) — ABNORMAL HIGH (ref 0.00–499.00)

## 2020-04-20 LAB — PHOSPHORUS: Phosphorus: 3.4 mg/dL (ref 2.5–4.6)

## 2020-04-20 LAB — LACTATE DEHYDROGENASE: LDH: 331 U/L — ABNORMAL HIGH (ref 98–192)

## 2020-04-20 LAB — C-REACTIVE PROTEIN: CRP: 6.7 mg/dL — ABNORMAL HIGH (ref <1.0)

## 2020-04-20 MED ORDER — METHYLPREDNISOLONE SODIUM SUCC 40 MG IJ SOLR
40.0000 mg | Freq: Two times a day (BID) | INTRAMUSCULAR | Status: DC
  Administered 2020-04-20 – 2020-04-27 (×14): 40 mg via INTRAVENOUS
  Filled 2020-04-20 (×15): qty 1

## 2020-04-20 NOTE — Progress Notes (Signed)
MEWS Yellow. MD Notified. MEWS Protocol implemented. Pt stable with no signs of distress.  PT has Hx of Afib. RN to continue to monitor    04/20/20 0816  Assess: MEWS Score  Temp 97.6 F (36.4 C)  BP (!) 144/91  Pulse Rate (!) 114  Resp (!) 24  SpO2 96 %  Assess: MEWS Score  MEWS Temp 0  MEWS Systolic 0  MEWS Pulse 2  MEWS RR 1  MEWS LOC 0  MEWS Score 3  MEWS Score Color Yellow  Assess: if the MEWS score is Yellow or Red  Were vital signs taken at a resting state? Yes  Focused Assessment No change from prior assessment  Early Detection of Sepsis Score *See Row Information* Low  MEWS guidelines implemented *See Row Information* Yes  Treat  MEWS Interventions Administered scheduled meds/treatments  Pain Scale 0-10  Pain Score 0  Take Vital Signs  Increase Vital Sign Frequency  Yellow: Q 2hr X 2 then Q 4hr X 2, if remains yellow, continue Q 4hrs  Escalate  MEWS: Escalate Yellow: discuss with charge nurse/RN and consider discussing with provider and RRT  Notify: Charge Nurse/RN  Name of Charge Nurse/RN Notified [place], RN  Date Charge Nurse/RN Notified 04/20/20  Time Charge Nurse/RN Notified 0820  Notify: Provider  Provider Name/Title Dia Crawford, MD  Date Provider Notified 04/20/20  Time Provider Notified [phone]  Notification Type Page  Notification Reason Change in status;Other (Comment) (Yellow MEWS)  Response No new orders  Date of Provider Response 04/20/20  Time of Provider Response [id number]  Document  Patient Outcome Other (Comment) (Pt Stable. Morning Meds Administered)  Progress note created (see row info) Yes

## 2020-04-20 NOTE — Progress Notes (Signed)
PROGRESS NOTE    Clinton Gordon  [id number] DOB: 1929/10/09 DOA: 04/17/2020 PCP: Tracie Harrier, MD     Brief Narrative:  Clinton Gordon is an 86 y.o. male with PMH significant for COPD, HTN, CAD, BPH, essential thrombocytosis and remote history of TB who was in his usual state of health until 4 days ago when he developed malaise and cough.  Patient states 2 days ago he became short of breath and was having difficulty breathing even at rest.  Patient denies any nausea vomiting or diarrhea.  Denies abdominal pain.  Denies chest pain.  His main concern is difficulty catching his breath.  Patient admits to cough, unclear whether it is productive.  Denies fevers or chills. Patient states he has been vaccinated x2 for Covid.  ED Course:  The patient was noted to be afebrile with tachycardia.  He was somewhat hypoxic on room air but corrected to 95% with 2 L.  Chest x-ray showed multifocal pulmonary opacities consistent with multifocal pneumonia as well as a possible loculated left-sided effusion.  Covid test was positive.  Patient was started on steroids and remdesivir and admitted for ongoing    Subjective: Afebrile A/O x4, positive S OB but states feels his energy returning.  Negative CP, negative abdominal pain.   Assessment & Plan: Covid vaccination;   Principal Problem:   Pneumonia due to COVID-19 virus Active Problems:   Essential thrombocythemia ([facility])   Hypertension   BPH without obstruction/lower urinary tract symptoms  Acute respiratory failure with hypoxia/COPD COVID-19 Labs  Recent Labs    04/17/20 1459 04/18/20 0556 04/19/20 0456 04/19/20 1018 04/20/20 0450  FERRITIN 252  --   --  394* 315  LDH 170  --   --  364* 331*  CRP 10.4* 20.2* 12.2*  --   --     Lab Results  Component Value Date   SARSCOV2NAA POSITIVE (A) 04/17/2020   T[place]evant Not Detected 07/19/2019    Covid pneumonia/COPD -Not on home O2 -Hydroxyurea 500 mg BID -Solu-Medrol 40 mg  BID -Remdesivir per pharmacy protocol -Combivent QID -Titrate O2 to maintain SPO2> 88%  New onset atrial fibrillation -CHADS2VASC score=4 -Rate control -Imdur 30 mg daily -Metoprolol 25 mg BID -Lovenox treatment dose -Transition to Eliquis  Abnormal chest CT with nodules -Patient has spiculated nodules with adenopathy on chest CT which is concerning for malignancy However patient's son Dr [name] states he has history of TB in the past -9/3 will discuss case with critical care medicine in the morning.  We will then contact patient's son to discuss plan going forward, biopsy prior to patient's discharge if possible.    CAD No chest pain, no evidence for ACS Continue aspirin, metoprolol, atorvastatin, Imdur  HTN -Imdur 30 mg daily  -Losartan 25 mg daily -Metoprolol 25 mg BID  BPH Continue Flomax   DVT prophylaxis: Lovenox Code Status: Full Family Communication:  Status is: Inpatient    Dispo: The patient is from: Home              Anticipated d/c is to: Home              Anticipated d/c date is: 9/5              Patient currently unstable      Consultants:    Procedures/Significant Events:  8/30 CT chest W0 contrast;Widespread multifocal areas of mixed interstitial, ground-glass and consolidative opacities with a peripheral and basilar predominance, with a peripheral and basilar predominance.  Findings are favored to represent multifocal pneumonia in the setting of COVID-19 positivity. 2. Bilateral spiculated masslike opacities in both upper lobes as well as more nodular intermediate to soft tissue attenuation pleural thickening in the upper lungs including a region contiguous with more diffuse conglomerate in infiltrative soft tissue attenuation along the right paramediastinal margin concerning for underlying malignancy/metastatic disease. 3. Additional mediastinal nodes concerning for metastatic adenopathy. 4. Indeterminate nodule of the left  adrenal gland measuring up to 1.6 cm in size, concerning for metastatic disease. 5. Somewhat indeterminate sclerotic focus in the left sixth rib laterally. Additional mixed sclerotic and lucent lesion seen in the T11 spinous process as well. Could reflect osseous metastatic disease. 6. Indeterminate 1 cm mass in the right renal fossa. Possible cyst from the upper pole right kidney. 7. Aortic Atherosclerosis (ICD10-I70.0). 8. Ascending aortic dilatation to 4.1 cm. Recommend annual imaging followup by CTA or MRA. This recommendation follows 2010 ACCF/AHA/AATS/ACR/ASA/SCA/SCAI/SIR/STS/SVM Guidelines for the Diagnosis and Management of Patients with Thoracic Aortic Disease. Circulation. 2010; 121: V409-W119. Aortic aneurysm NOS (ICD10-I71.9) 9. Coronary artery calcifications are present. Please note that the presence of coronary artery calcium documents the presence of coronary artery disease, the severity of this disease and any potential stenosis cannot be assessed on this non-gated CT examination. Assessment for potential risk factor modification, dietary therapy or pharmacologic therapy may be warranted. 8/31 echocardiogram;Left Ventricle: Left ventricular ejection fraction, by estimation, is 55  to 60%. The left ventricle has normal function. The left ventricle has no  regional wall motion abnormalities. The left ventricular internal cavity  size was normal in size. There is  no left ventricular hypertrophy. Left ventricular diastolic parameters  are indeterminate.   Right Ventricle: The right ventricular size is normal. No increase in  right ventricular wall thickness. Right ventricular systolic function is  normal. There is moderately elevated pulmonary artery systolic pressure.  The tricuspid regurgitant velocity is  3.19 m/s, and with an assumed right atrial pressure of 10 mmHg, the  estimated right ventricular systolic pressure is 14.7 mmHg.     Pulmonic Valve: The pulmonic  valve was normal in structure. Pulmonic valve  regurgitation is not visualized. No evidence of pulmonic stenosis.   Aorta: The aortic root is normal in size and structure.   Venous: The inferior vena cava is dilated in size with less than 50%  respiratory variability, suggesting right atrial pressure of 15 mmHg.   IAS/Shunts: No atrial level shunt detected by color flow Doppler.   I have personally reviewed and interpreted all radiology studies and my findings are as above.  VENTILATOR SETTINGS: Nasal cannula 9/2 Flow 2 L/min SPO2 94%   Cultures  Antimicrobials: Anti-infectives (From admission, onward)   Start     Ordered Stop   04/18/20 1000  remdesivir 100 mg in sodium chloride 0.9 % 100 mL IVPB       "Followed by" Linked Group Details   04/17/20 1443 04/22/20 0959   04/17/20 1600  remdesivir 200 mg in sodium chloride 0.9% 250 mL IVPB       "Followed by" Linked Group Details   04/17/20 1443 04/17/20 1659   04/17/20 1230  cefTRIAXone (ROCEPHIN) 2 g in sodium chloride 0.9 % 100 mL IVPB  Status:  Discontinued        04/17/20 1218 04/17/20 1725   04/17/20 1230  azithromycin (ZITHROMAX) 500 mg in sodium chloride 0.9 % 250 mL IVPB  Status:  Discontinued        04/17/20 1218 04/17/20  [place] / TUBES:      Continuous Infusions: . sodium chloride    . remdesivir 100 mg in NS 100 mL Stopped (04/19/20 1022)     Objective: Vitals:   04/19/20 1656 04/19/20 2016 04/20/20 0612 04/20/20 0816  BP: 136/83 112/68 133/76 (!) 144/91  Pulse: 98 82 (!) 105 (!) 114  Resp: 16 (!) 22 20 (!) 24  Temp: 98.3 F (36.8 C) 98.2 F (36.8 C) 97.9 F (36.6 C) 97.6 F (36.4 C)  TempSrc: Oral Oral Oral Oral  SpO2: 97% 92% 98% 96%  Weight:      Height:        Intake/Output Summary (Last 24 hours) at 04/20/2020 0933 Last data filed at 04/20/2020 0816 Gross per 24 hour  Intake 149.68 ml  Output 1600 ml  Net -1450.32 ml   Filed Weights   04/17/20 1017 04/17/20  1915  Weight: 51.7 kg 51.7 kg    Examination:  General: A/O x4, positive acute respiratory distress Eyes: negative scleral hemorrhage, negative anisocoria, negative icterus ENT: Negative Runny nose, negative gingival bleeding, Neck:  Negative scars, masses, torticollis, lymphadenopathy, JVD Lungs: decreased breath sounds bilaterally without wheezes or crackles Cardiovascular: Regular rate and rhythm without murmur gallop or rub normal S1 and S2 Abdomen: negative abdominal pain, nondistended, positive soft, bowel sounds, no rebound, no ascites, no appreciable mass Extremities: No significant cyanosis, clubbing, or edema bilateral lower extremities Skin: Negative rashes, lesions, ulcers Psychiatric:  Negative depression, negative anxiety, negative fatigue, negative mania  Central nervous system:  Cranial nerves II through XII intact, tongue/uvula midline, all extremities muscle strength 5/5, sensation intact throughout, negative dysarthria, negative expressive aphasia, negative receptive aphasia.  .     Data Reviewed: Care during the described time interval was provided by me .  I have reviewed this patient's available data, including medical history, events of note, physical examination, and all test results as part of my evaluation.  CBC: Recent Labs  Lab 04/17/20 1006 04/18/20 0556 04/19/20 0456 04/20/20 0450  WBC 10.3 9.7 16.9* 17.0*  NEUTROABS  --  8.2* 14.5* 14.0*  HGB 12.0* 12.2* 12.5* 12.5*  HCT 35.6* 35.4* 37.1* 36.7*  MCV 95.4 93.9 95.4 93.9  PLT 411* 420* 574* 275*   Basic Metabolic Panel: Recent Labs  Lab 04/17/20 1006 04/18/20 0556 04/19/20 0456 04/19/20 1018 04/20/20 0450  NA 131* 132* 130*  --  131*  K 4.9 5.1 4.8  --  4.4  CL 99 103 97*  --  100  CO2 25 22 24   --  25  GLUCOSE 112* 130* 110*  --  106*  BUN 17 19 31*  --  30*  CREATININE 0.91 0.95 1.06  --  0.80  CALCIUM 8.3* 8.3* 8.5*  --  8.3*  MG 2.0  --   --  2.0 1.9  PHOS  --   --   --  3.5 3.4     GFR: Estimated Creatinine Clearance: 47.6 mL/min (by C-G formula based on SCr of 0.8 mg/dL). Liver Function Tests: Recent Labs  Lab 04/18/20 0556 04/19/20 0456 04/20/20 0450  AST 33 38 33  ALT 22 23 24   ALKPHOS 80 83 75  BILITOT 1.4* 1.5* 1.3*  PROT 6.3* 6.5 5.8*  ALBUMIN 2.8* 2.9* 2.6*   No results for input(s): LIPASE, AMYLASE in the last 168 hours. No results for input(s): AMMONIA in the last 168 hours. Coagulation Profile: Recent Labs  Lab 04/17/20  1908  INR 1.2   Cardiac Enzymes: No results for input(s): CKTOTAL, CKMB, CKMBINDEX, TROPONINI in the last 168 hours. BNP (last 3 results) No results for input(s): PROBNP in the last 8760 hours. HbA1C: No results for input(s): HGBA1C in the last 72 hours. CBG: No results for input(s): GLUCAP in the last 168 hours. Lipid Profile: Recent Labs    04/17/20 1459  TRIG 44   Thyroid Function Tests: No results for input(s): TSH, T4TOTAL, FREET4, T3FREE, THYROIDAB in the last 72 hours. Anemia Panel: Recent Labs    04/19/20 1018 04/20/20 0450  FERRITIN 394* 315   Sepsis Labs: Recent Labs  Lab 04/17/20 1305 04/17/20 1459  PROCALCITON  --  <0.10  LATICACIDVEN 1.5  --     Recent Results (from the past 240 hour(s))  SARS Coronavirus 2 by RT PCR (hospital order, performed in [facility] hospital lab) Nasopharyngeal Nasopharyngeal Swab     Status: Abnormal   Collection Time: 04/17/20 12:02 PM   Specimen: Nasopharyngeal Swab  Result Value Ref Range Status   SARS Coronavirus 2 POSITIVE (A) NEGATIVE Final    Comment: RESULT CALLED TO, READ BACK BY AND VERIFIED WITH: GREG MOYER,RN 1412 04/17/2020 DB (NOTE) SARS-CoV-2 target nucleic acids are DETECTED  SARS-CoV-2 RNA is generally detectable in upper respiratory specimens  during the acute phase of infection.  Positive results are indicative  of the presence of the identified virus, but do not rule out bacterial infection or co-infection with other pathogens  not detected by the test.  Clinical correlation with patient history and  other diagnostic information is necessary to determine patient infection status.  The expected result is negative.  Fact Sheet for Patients:   StrictlyIdeas.no   Fact Sheet for Healthcare Providers:   BankingDealers.co.za    This test is not yet approved or cleared by the [place] FDA and  has been authorized for detection and/or diagnosis of SARS-CoV-2 by FDA under an Emergency Use Authorization (EUA).  This EUA will remain in effect (meaning this test  can be used) for the duration of  the COVID-19 declaration under Section 564(b)(1) of the Act, 21 U.S.C. section 360-bbb-3(b)(1), unless the authorization is terminated or revoked sooner.  Performed at [facility], [address] [place], [place] [address]   Blood Culture (routine x 2)     Status: None (Preliminary result)   Collection Time: 04/17/20 12:02 PM   Specimen: BLOOD  Result Value Ref Range Status   Specimen Description BLOOD LEFT ARM  Final   Special Requests   Final    BOTTLES DRAWN AEROBIC AND ANAEROBIC Blood Culture adequate volume   Culture   Final    NO GROWTH 3 DAYS Performed at [facility], [address], [place], [place] [address]    Report Status PENDING  Incomplete  Urine culture     Status: None   Collection Time: 04/17/20  1:05 PM   Specimen: In/Out Cath Urine  Result Value Ref Range Status   Specimen Description   Final    IN/OUT CATH URINE Performed at [facility], [address], [place], [place] [address]    Special Requests   Final    NONE Performed at [facility], [address] [place], [place] [address]    Culture   Final    NO GROWTH Performed at [facility], [place] [address], [place], [place] [address]    Report Status 04/18/2020 FINAL  Final  Blood Culture (routine  x 2)     Status: None (Preliminary result)    Collection Time: 04/17/20  7:08 PM   Specimen: BLOOD  Result Value Ref Range Status   Specimen Description BLOOD RIGHT ANTECUBITAL  Final   Special Requests   Final    BOTTLES DRAWN AEROBIC AND ANAEROBIC Blood Culture adequate volume   Culture   Final    NO GROWTH 3 DAYS Performed at [facility], [address] [place], [place] [address]    Report Status PENDING  Incomplete         Radiology Studies: ECHOCARDIOGRAM COMPLETE  Result Date: 04/18/2020    ECHOCARDIOGRAM REPORT   Patient Name:   Clinton Gordon Date of Exam: 04/18/2020 Medical Rec #:  [id number]         Height:       62.0 in Accession #:    [id number]        Weight:       114.0 lb Date of Birth:  [birth year]          BSA:          1.505 m Patient Age:    9 years          BP:           130/89 mmHg Patient Gender: M                 HR:           113 bpm. Exam Location:  [facility] Procedure: 2D Echo, Color Doppler and Cardiac Doppler Indications:     I48.91 Atrial fibrillation  History:         Patient has no prior history of Echocardiogram examinations.                  Risk Factors:Hypertension. Pt tested positive for COVID-19 on                  04/17/2020.  Sonographer:     Charmayne Sheer RDCS (AE) Referring Phys:  [id number] Tony Diagnosing Phys: Ida Rogue MD IMPRESSIONS  1. Left ventricular ejection fraction, by estimation, is 55 %. The left ventricle has normal function. The left ventricle has no regional wall motion abnormalities. Left ventricular diastolic parameters are indeterminate.  2. Right ventricular systolic function is normal. The right ventricular size is normal. There is moderately elevated pulmonary artery systolic pressure. The estimated right ventricular systolic pressure is 10.6 mmHg.  3. The inferior vena cava is dilated in size with <50% respiratory variability, suggesting right atrial pressure of 15 mmHg. FINDINGS  Left Ventricle: Left ventricular ejection fraction, by estimation, is  55 to 60%. The left ventricle has normal function. The left ventricle has no regional wall motion abnormalities. The left ventricular internal cavity size was normal in size. There is  no left ventricular hypertrophy. Left ventricular diastolic parameters are indeterminate. Right Ventricle: The right ventricular size is normal. No increase in right ventricular wall thickness. Right ventricular systolic function is normal. There is moderately elevated pulmonary artery systolic pressure. The tricuspid regurgitant velocity is 3.19 m/s, and with an assumed right atrial pressure of 10 mmHg, the estimated right ventricular systolic pressure is 26.9 mmHg. Left Atrium: Left atrial size was normal in size. Right Atrium: Right atrial size was normal in size. Pericardium: A small pericardial effusion is present. Mitral Valve: The mitral valve is normal in structure. Normal mobility of the mitral valve leaflets. No evidence of mitral valve regurgitation. No evidence  of mitral valve stenosis. MV peak gradient, 8.1 mmHg. The mean mitral valve gradient is 3.0 mmHg. Tricuspid Valve: The tricuspid valve is normal in structure. Tricuspid valve regurgitation is mild . No evidence of tricuspid stenosis. Aortic Valve: The aortic valve was not well visualized. Aortic valve regurgitation is not visualized. Mild to moderate aortic valve sclerosis/calcification is present, without any evidence of aortic stenosis. Aortic valve mean gradient measures 6.0 mmHg.  Aortic valve peak gradient measures 10.9 mmHg. Aortic valve area, by VTI measures 1.67 cm. Pulmonic Valve: The pulmonic valve was normal in structure. Pulmonic valve regurgitation is not visualized. No evidence of pulmonic stenosis. Aorta: The aortic root is normal in size and structure. Venous: The inferior vena cava is dilated in size with less than 50% respiratory variability, suggesting right atrial pressure of 15 mmHg. IAS/Shunts: No atrial level shunt detected by color flow  Doppler.  LEFT VENTRICLE PLAX 2D LVIDd:         4.57 cm  Diastology LVIDs:         3.36 cm  LV e' lateral:   8.59 cm/s LV PW:         0.90 cm  LV E/e' lateral: 16.1 LV IVS:        0.66 cm  LV e' medial:    7.83 cm/s LVOT diam:     2.10 cm  LV E/e' medial:  17.7 LV SV:         42 LV SV Index:   28 LVOT Area:     3.46 cm  RIGHT VENTRICLE RV Basal diam:  3.32 cm LEFT ATRIUM             Index       RIGHT ATRIUM           Index LA diam:        3.30 cm 2.19 cm/m  RA Area:     16.30 cm LA Vol (A2C):   31.0 ml 20.59 ml/m RA Volume:   45.30 ml  30.09 ml/m LA Vol (A4C):   40.0 ml 26.57 ml/m LA Biplane Vol: 35.7 ml 23.72 ml/m  AORTIC VALVE                    PULMONIC VALVE AV Area (Vmax):    1.74 cm     PV Vmax:       1.06 m/s AV Area (Vmean):   1.64 cm     PV Vmean:      61.600 cm/s AV Area (VTI):     1.67 cm     PV VTI:        0.131 m AV Vmax:           165.00 cm/s  PV Peak grad:  4.5 mmHg AV Vmean:          108.000 cm/s PV Mean grad:  2.0 mmHg AV VTI:            0.249 m AV Peak Grad:      10.9 mmHg AV Mean Grad:      6.0 mmHg LVOT Vmax:         83.00 cm/s LVOT Vmean:        51.200 cm/s LVOT VTI:          0.120 m LVOT/AV VTI ratio: 0.48  AORTA Ao Root diam: 3.10 cm MITRAL VALVE                TRICUSPID VALVE MV Area (PHT): 4.37 cm  TR Peak grad:   40.7 mmHg MV Peak grad:  8.1 mmHg     TR Vmax:        319.00 cm/s MV Mean grad:  3.0 mmHg MV Vmax:       1.42 m/s     SHUNTS MV Vmean:      81.1 cm/s    Systemic VTI:  0.12 m MV Decel Time: 174 msec     Systemic Diam: 2.10 cm MV E velocity: 138.67 cm/s Ida Rogue MD Electronically signed by Ida Rogue MD Signature Date/Time: 04/18/2020/11:01:31 AM    Final         Scheduled Meds: . aspirin EC  81 mg Oral Daily  . atorvastatin  20 mg Oral Daily  . dexamethasone (DECADRON) injection  6 mg Intravenous Q24H  . enoxaparin (LOVENOX) injection  50 mg Subcutaneous Q12H  . hydroxyurea  500 mg Oral BID  . Ipratropium-Albuterol  1 puff Inhalation Q6H  .  isosorbide mononitrate  30 mg Oral Daily  . losartan  25 mg Oral Daily  . metoprolol tartrate  25 mg Oral BID  . sodium chloride flush  3 mL Intravenous Q12H  . tamsulosin  0.4 mg Oral QPC supper   Continuous Infusions: . sodium chloride    . remdesivir 100 mg in NS 100 mL Stopped (04/19/20 1022)     LOS: 3 days    Time spent:40 min    Luciel Brickman, Geraldo Docker, MD [facility] Pager [phone]  If 7PM-7AM, please contact night-coverage [email or web address] Password [facility] 04/20/2020, 9:33 AM

## 2020-04-20 NOTE — Consult Note (Addendum)
[place] for Lovenox Indication: atrial fibrillation  No Known Allergies  Patient Measurements: Height: 5\' 2"  (157.5 cm) Weight: 51.7 kg (113 lb 15.7 oz) IBW/kg (Calculated) : 54.6  Vital Signs: Temp: 98.2 F (36.8 C) (09/02 1016) Temp Source: Oral (09/02 1016) BP: 146/85 (09/02 1016) Pulse Rate: 114 (09/02 1016)  Labs: Recent Labs    04/19/20 0456 04/19/20 0456 04/20/20 0450 04/21/20 0736  HGB 12.5*   < > 12.5* 14.1  HCT 37.1*  --  36.7* 40.0  PLT 574*  --  605* 703*  CREATININE 1.06  --  0.80 0.83   < > = values in this interval not displayed.    Estimated Creatinine Clearance: 47.6 mL/min (by C-G formula based on SCr of 0.8 mg/dL).   Medications:  No anticoagulation prior to admission per chart review  Assessment: Patient is an 86 y/o M with medical history including essential thrombocythemia who is admitted with COVID-19 pneumonia. Patient noted to be in atrial fibrillation. Pharmacy has been consulted to initiate therapeutic Lovenox for atrial fibrillation.    Plan:  --Lovenox 50 mg (1 mg/kg) q12h --CBC per protocol --Monitor renal function and adjust as indicated   Lu Duffel, PharmD, BCPS Clinical Pharmacist 04/20/2020 11:08 AM

## 2020-04-21 DIAGNOSIS — R0602 Shortness of breath: Secondary | ICD-10-CM | POA: Diagnosis not present

## 2020-04-21 DIAGNOSIS — J189 Pneumonia, unspecified organism: Secondary | ICD-10-CM | POA: Diagnosis not present

## 2020-04-21 DIAGNOSIS — D473 Essential (hemorrhagic) thrombocythemia: Secondary | ICD-10-CM | POA: Diagnosis not present

## 2020-04-21 DIAGNOSIS — N400 Enlarged prostate without lower urinary tract symptoms: Secondary | ICD-10-CM | POA: Diagnosis not present

## 2020-04-21 DIAGNOSIS — I272 Pulmonary hypertension, unspecified: Secondary | ICD-10-CM

## 2020-04-21 DIAGNOSIS — U071 COVID-19: Secondary | ICD-10-CM | POA: Diagnosis not present

## 2020-04-21 LAB — COMPREHENSIVE METABOLIC PANEL
ALT: 25 U/L (ref 0–44)
AST: 30 U/L (ref 15–41)
Albumin: 2.9 g/dL — ABNORMAL LOW (ref 3.5–5.0)
Alkaline Phosphatase: 84 U/L (ref 38–126)
Anion gap: 10 (ref 5–15)
BUN: 25 mg/dL — ABNORMAL HIGH (ref 8–23)
CO2: 23 mmol/L (ref 22–32)
Calcium: 8.6 mg/dL — ABNORMAL LOW (ref 8.9–10.3)
Chloride: 98 mmol/L (ref 98–111)
Creatinine, Ser: 0.83 mg/dL (ref 0.61–1.24)
GFR calc Af Amer: >60 mL/min (ref >60)
GFR calc non Af Amer: >60 mL/min (ref >60)
Glucose, Bld: 130 mg/dL — ABNORMAL HIGH (ref 70–99)
Potassium: 4.3 mmol/L (ref 3.5–5.1)
Sodium: 131 mmol/L — ABNORMAL LOW (ref 135–145)
Total Bilirubin: 1.4 mg/dL — ABNORMAL HIGH (ref 0.3–1.2)
Total Protein: 6.3 g/dL — ABNORMAL LOW (ref 6.5–8.1)

## 2020-04-21 LAB — LACTATE DEHYDROGENASE: LDH: 383 U/L — ABNORMAL HIGH (ref 98–192)

## 2020-04-21 LAB — CBC WITH DIFFERENTIAL/PLATELET
Abs Immature Granulocytes: 0.74 10*3/uL — ABNORMAL HIGH (ref 0.00–0.07)
Basophils Absolute: 0 10*3/uL (ref 0.0–0.1)
Basophils Relative: 0 %
Eosinophils Absolute: 0 10*3/uL (ref 0.0–0.5)
Eosinophils Relative: 0 %
HCT: 40 % (ref 39.0–52.0)
Hemoglobin: 14.1 g/dL (ref 13.0–17.0)
Immature Granulocytes: 4 %
Lymphocytes Relative: 7 %
Lymphs Abs: 1.2 10*3/uL (ref 0.7–4.0)
MCH: 32.3 pg (ref 26.0–34.0)
MCHC: 35.3 g/dL (ref 30.0–36.0)
MCV: 91.5 fL (ref 80.0–100.0)
Monocytes Absolute: 1 10*3/uL (ref 0.1–1.0)
Monocytes Relative: 6 %
Neutro Abs: 14.4 10*3/uL — ABNORMAL HIGH (ref 1.7–7.7)
Neutrophils Relative %: 83 %
Platelets: 703 10*3/uL — ABNORMAL HIGH (ref 150–400)
RBC: 4.37 MIL/uL (ref 4.22–5.81)
RDW: 29 % — ABNORMAL HIGH (ref 11.5–15.5)
Smear Review: ADEQUATE
WBC: 17.4 10*3/uL — ABNORMAL HIGH (ref 4.0–10.5)
nRBC: 0 % (ref 0.0–0.2)

## 2020-04-21 LAB — MAGNESIUM: Magnesium: 2 mg/dL (ref 1.7–2.4)

## 2020-04-21 LAB — C-REACTIVE PROTEIN: CRP: 5.5 mg/dL — ABNORMAL HIGH (ref <1.0)

## 2020-04-21 LAB — FERRITIN: Ferritin: 319 ng/mL (ref 24–336)

## 2020-04-21 LAB — FIBRIN DERIVATIVES D-DIMER (ARMC ONLY): Fibrin derivatives D-dimer (ARMC): 465.48 ng/mL (FEU) (ref 0.00–499.00)

## 2020-04-21 LAB — PHOSPHORUS: Phosphorus: 3.2 mg/dL (ref 2.5–4.6)

## 2020-04-21 MED ORDER — IPRATROPIUM-ALBUTEROL 20-100 MCG/ACT IN AERS
1.0000 | INHALATION_SPRAY | Freq: Four times a day (QID) | RESPIRATORY_TRACT | Status: DC
  Administered 2020-04-21 – 2020-04-27 (×17): 1 via RESPIRATORY_TRACT
  Filled 2020-04-21 (×2): qty 4

## 2020-04-21 NOTE — Consult Note (Signed)
Pulmonary Medicine          Date: 04/21/2020,   MRN# [id number] Clinton Gordon [birth year]     AdmissionWeight: 51.7 kg                 CurrentWeight: 51.7 kg   Referring physician: Dr Sherral Hammers   CHIEF COMPLAINT:   Multiple lung nodules    HISTORY OF PRESENT ILLNESS   48M w hx of   COPD, HTN, CAD, BPH, essential thrombocytosis and remote history of TB who was in his usual state of health until 4 days ago when he developed malaise and cough.  Patient states 2 days ago he became short of breath and was having difficulty breathing even at rest.  Patient denies any nausea vomiting or diarrhea.  Denies abdominal pain.  Denies chest pain.  His main concern is difficulty catching his breath.  Patient admits to cough, unclear whether it is productive.  Denies fevers or chills. Patient states he has been vaccinated x2 for Covid. The patient was noted to be afebrile with tachycardia.  He was somewhat hypoxic on room air but corrected to 95% with 2 L.  Chest x-ray showed multifocal pulmonary opacities consistent with multifocal pneumonia as well as a possible loculated left-sided effusion.  Covid test was positive.  Patient was started on steroids and remdesivir and admitted for ongoing treatment.   PAST MEDICAL HISTORY   Past Medical History:  Diagnosis Date  . Hypertension   . Kidney stones      SURGICAL HISTORY   Past Surgical History:  Procedure Laterality Date  . CARDIAC SURGERY       FAMILY HISTORY   History reviewed. No pertinent family history.   SOCIAL HISTORY   Social History   Tobacco Use  . Smoking status: Never Smoker  . Smokeless tobacco: Never Used  Vaping Use  . Vaping Use: Never used  Substance Use Topics  . Alcohol use: No  . Drug use: Not on file     MEDICATIONS    Home Medication:    Current Medication:  Current Facility-Administered Medications:  .  0.9 %  sodium chloride infusion, 250 mL, Intravenous, PRN, Bonnell Public  Tublu, MD .  acetaminophen (TYLENOL) tablet 650 mg, 650 mg, Oral, Q6H PRN, Jamse Arn, [place] Tublu, MD .  albuterol (VENTOLIN HFA) 108 (90 Base) MCG/ACT inhaler 1-2 puff, 1-2 puff, Inhalation, Q4H PRN, Vashti Hey, MD, 2 puff at 04/20/20 0946 .  aspirin EC tablet 81 mg, 81 mg, Oral, Daily, Bonnell Public Tublu, MD, 81 mg at 04/21/20 0926 .  atorvastatin (LIPITOR) tablet 20 mg, 20 mg, Oral, Daily, Bonnell Public Tublu, MD, 20 mg at 04/21/20 0926 .  chlorpheniramine-HYDROcodone (TUSSIONEX) 10-8 MG/5ML suspension 5 mL, 5 mL, Oral, Q12H PRN, Bonnell Public Tublu, MD .  enoxaparin (LOVENOX) injection 50 mg, 50 mg, Subcutaneous, Q12H, Benita Gutter, RPH, 50 mg at 04/21/20 0926 .  guaiFENesin-dextromethorphan (ROBITUSSIN DM) 100-10 MG/5ML syrup 10 mL, 10 mL, Oral, Q4H PRN, Bonnell Public Tublu, MD, 10 mL at 04/18/20 2123 .  hydroxyurea (HYDREA) capsule 500 mg, 500 mg, Oral, BID, Bonnell Public Tublu, MD, 500 mg at 04/21/20 0927 .  Ipratropium-Albuterol (COMBIVENT) respimat 1 puff, 1 puff, Inhalation, Q6H, Vashti Hey, MD, 1 puff at 04/21/20 0929 .  isosorbide mononitrate (IMDUR) 24 hr tablet 30 mg, 30 mg, Oral, Daily, Bonnell Public Tublu, MD, 30 mg at 04/21/20 0927 .  losartan (COZAAR) tablet 25 mg, 25 mg, Oral, Daily, Jamse Arn,  Kyra Searles, MD, 25 mg at 04/21/20 0927 .  methylPREDNISolone sodium succinate (SOLU-MEDROL) 40 mg/mL injection 40 mg, 40 mg, Intravenous, Q12H, Allie Bossier, MD, 40 mg at 04/21/20 0926 .  metoprolol tartrate (LOPRESSOR) tablet 25 mg, 25 mg, Oral, BID, Priscella Mann, Sudheer B, MD, 25 mg at 04/21/20 0929 .  ondansetron (ZOFRAN) tablet 4 mg, 4 mg, Oral, Q6H PRN **OR** ondansetron (ZOFRAN) injection 4 mg, 4 mg, Intravenous, Q6H PRN, Jamse Arn, Dewaine Oats Tublu, MD .  polyethylene glycol (MIRALAX / GLYCOLAX) packet 17 g, 17 g, Oral, Daily PRN, Bonnell Public Tublu, MD .  sodium chloride flush (NS) 0.9 % injection 3  mL, 3 mL, Intravenous, Q12H, Bonnell Public Tublu, MD, 3 mL at 04/21/20 0929 .  sodium chloride flush (NS) 0.9 % injection 3 mL, 3 mL, Intravenous, PRN, Bonnell Public Tublu, MD .  tamsulosin (FLOMAX) capsule 0.4 mg, 0.4 mg, Oral, QPC supper, Bonnell Public Tublu, MD, 0.4 mg at 04/20/20 1850    ALLERGIES   Patient has no known allergies.     REVIEW OF SYSTEMS    Review of Systems:  Gen:  Denies  fever, sweats, chills weigh loss  HEENT: Denies blurred vision, double vision, ear pain, eye pain, hearing loss, nose bleeds, sore throat Cardiac:  No dizziness, chest pain or heaviness, chest tightness,edema Resp:   Denies cough or sputum porduction, shortness of breath,wheezing, hemoptysis,  Gi: Denies swallowing difficulty, stomach pain, nausea or vomiting, diarrhea, constipation, bowel incontinence Gu:  Denies bladder incontinence, burning urine Ext:   Denies Joint pain, stiffness or swelling Skin: Denies  skin rash, easy bruising or bleeding or hives Endoc:  Denies polyuria, polydipsia , polyphagia or weight change Psych:   Denies depression, insomnia or hallucinations   Other:  All other systems negative   VS: BP (!) 143/86 (BP Location: Left Arm)   Pulse 97   Temp 97.9 F (36.6 C) (Oral)   Resp (!) 23   Ht 5\' 2"  (1.575 m)   Wt 51.7 kg   SpO2 91%   BMI 20.85 kg/m      PHYSICAL EXAM    GENERAL:NAD, no fevers, chills, no weakness no fatigue HEAD: Normocephalic, atraumatic.  EYES: Pupils equal, round, reactive to light. Extraocular muscles intact. No scleral icterus.  MOUTH: Moist mucosal membrane. Dentition intact. No abscess noted.  EAR, NOSE, THROAT: Clear without exudates. No external lesions.  NECK: Supple. No thyromegaly. No nodules. No JVD.  PULMONARY: Diffuse coarse rhonchi right sided +wheezes CARDIOVASCULAR: S1 and S2. Regular rate and rhythm. No murmurs, rubs, or gallops. No edema. Pedal pulses 2+ bilaterally.  GASTROINTESTINAL: Soft,  nontender, nondistended. No masses. Positive bowel sounds. No hepatosplenomegaly.  MUSCULOSKELETAL: No swelling, clubbing, or edema. Range of motion full in all extremities.  NEUROLOGIC: Cranial nerves II through XII are intact. No gross focal neurological deficits. Sensation intact. Reflexes intact.  SKIN: No ulceration, lesions, rashes, or cyanosis. Skin warm and dry. Turgor intact.  PSYCHIATRIC: Mood, affect within normal limits. The patient is awake, alert and oriented x 3. Insight, judgment intact.       IMAGING    DG Chest 2 View  Result Date: 04/17/2020 CLINICAL DATA:  Chest pain, COVID positive EXAM: CHEST - 2 VIEW COMPARISON:  None FINDINGS: Multifocal opacities bilaterally. Possible loculated right pleural effusion along the lateral chest wall extending to the apex. Right paratracheal prominence. No pneumothorax. Heart size is within normal limits. Evidence of prior CABG. No acute osseous abnormality. IMPRESSION: Multifocal pulmonary opacities suspicious for pneumonia. Possible loculated  right pleural effusion extending to the apex. Electronically Signed   By: Macy Mis M.D.   On: 04/17/2020 10:49   CT Chest Wo Contrast  Result Date: 04/17/2020 CLINICAL DATA:  COVID-19 positive last week, presents with worsening shortness of breath, possible effusion EXAM: CT CHEST WITHOUT CONTRAST TECHNIQUE: Multidetector CT imaging of the chest was performed following the standard protocol without IV contrast. COMPARISON:  Radiograph 04/17/2020 FINDINGS: Cardiovascular: Borderline cardiomegaly. Three-vessel coronary artery disease. Calcifications of the aortic leaflets. Atherosclerotic plaque within the thoracic aorta. Ascending thoracic aortic dilatation to 4.1 cm central pulmonary arteries are upper limits normal caliber. Luminal evaluation of vasculature precluded in the absence of contrast media. Mediastinum/Nodes: Multiple borderline enlarged mediastinal and hilar nodes with additional more  conglomerate, infiltrative soft tissue attenuation in the right hilum extending to the paramediastinal border, margins of which are difficult to fully ascertain in the absence of contrast media no. Several larger index nodes in the mediastinum include a 10 mm prevascular lymph node (2/40), 10 mm AP window lymph node (2/44), and a 11 mm precarinal lymph node (2/44). Resulting in narrowing and segmental occlusion of several of the mid to lower lung airways on the right. Additional flattening of the lower trachea and airways, could reflect some degree of tracheal malacia. Secretions noted in the lower airways as well. Thoracic esophagus with a small hiatal hernia. No other acute abnormality. Thyroid gland with few punctate calcifications. No discrete nodules. Lungs/Pleura: Widespread multifocal areas of mixed interstitial, ground-glass and consolidative opacities with a peripheral and basilar predominance. There are additional areas of more spiculated masslike opacity present in the left upper lobe (3/31) measuring up to 2.4 x 2.3 cm in size. A separate spiculated masslike nodule seen in the left apex as well measuring 1.6 x 1.6 cm in size (3/19). Furthermore, the previously suspected effusion in the right lung demonstrates some more intermediate attenuation with lobular margins superiorly possibly reflecting a pleural based lesion given contiguity with the mediastinal and hilar soft tissue attenuation posteriorly (2/25). Additional nodular pleural thickening is seen in the left lung as well (2/45). Small fluid attenuation of fusions are noted more inferiorly bilaterally. Upper Abdomen: Indeterminate possible cyst arising in the upper right renal fossa (2/147). Indeterminate nodule of the left adrenal gland measuring up to 1.6 cm (2/138). Musculoskeletal: Postsurgical changes from prior sternotomy with bony fusion across the sternal plate manubrium. Intact sternal sutures. Multilevel degenerative changes in the spine.  Somewhat indeterminate sclerotic focus present in the left sixth rib laterally (5/52). Additional mixed sclerotic and lucent lesion seen T11 spinous process as well (6/81). IMPRESSION: 1. Widespread multifocal areas of mixed interstitial, ground-glass and consolidative opacities with a peripheral and basilar predominance, with a peripheral and basilar predominance. Findings are favored to represent multifocal pneumonia in the setting of COVID-19 positivity. 2. Bilateral spiculated masslike opacities in both upper lobes as well as more nodular intermediate to soft tissue attenuation pleural thickening in the upper lungs including a region contiguous with more diffuse conglomerate in infiltrative soft tissue attenuation along the right paramediastinal margin concerning for underlying malignancy/metastatic disease. 3. Additional mediastinal nodes concerning for metastatic adenopathy. 4. Indeterminate nodule of the left adrenal gland measuring up to 1.6 cm in size, concerning for metastatic disease. 5. Somewhat indeterminate sclerotic focus in the left sixth rib laterally. Additional mixed sclerotic and lucent lesion seen in the T11 spinous process as well. Could reflect osseous metastatic disease. 6. Indeterminate 1 cm mass in the right renal fossa. Possible cyst from the  upper pole right kidney. 7. Aortic Atherosclerosis (ICD10-I70.0). 8. Ascending aortic dilatation to 4.1 cm. Recommend annual imaging followup by CTA or MRA. This recommendation follows 2010 ACCF/AHA/AATS/ACR/ASA/SCA/SCAI/SIR/STS/SVM Guidelines for the Diagnosis and Management of Patients with Thoracic Aortic Disease. Circulation. 2010; 121: W098-J191. Aortic aneurysm NOS (ICD10-I71.9) 9. Coronary artery calcifications are present. Please note that the presence of coronary artery calcium documents the presence of coronary artery disease, the severity of this disease and any potential stenosis cannot be assessed on this non-gated CT examination.  Assessment for potential risk factor modification, dietary therapy or pharmacologic therapy may be warranted. These results were called by telephone at the time of interpretation on 04/17/2020 at 4:23 pm to provider Dr. Cherylann Banas, who verbally acknowledged these results. Electronically Signed   By: Lovena Le M.D.   On: 04/17/2020 16:24   ECHOCARDIOGRAM COMPLETE  Result Date: 04/18/2020    ECHOCARDIOGRAM REPORT   Patient Name:   JOHNIE MAKKI Date of Exam: 04/18/2020 Medical Rec #:  [id number]         Height:       62.0 in Accession #:    [id number]        Weight:       114.0 lb Date of Birth:  29-Jan-1930          BSA:          1.505 m Patient Age:    16 years          BP:           130/89 mmHg Patient Gender: M                 HR:           113 bpm. Exam Location:  [facility] Procedure: 2D Echo, Color Doppler and Cardiac Doppler Indications:     I48.91 Atrial fibrillation  History:         Patient has no prior history of Echocardiogram examinations.                  Risk Factors:Hypertension. Pt tested positive for COVID-19 on                  04/17/2020.  Sonographer:     Charmayne Sheer RDCS (AE) Referring Phys:  [id number] [place] Diagnosing Phys: Ida Rogue MD IMPRESSIONS  1. Left ventricular ejection fraction, by estimation, is 55 %. The left ventricle has normal function. The left ventricle has no regional wall motion abnormalities. Left ventricular diastolic parameters are indeterminate.  2. Right ventricular systolic function is normal. The right ventricular size is normal. There is moderately elevated pulmonary artery systolic pressure. The estimated right ventricular systolic pressure is 13.2 mmHg.  3. The inferior vena cava is dilated in size with <50% respiratory variability, suggesting right atrial pressure of 15 mmHg. FINDINGS  Left Ventricle: Left ventricular ejection fraction, by estimation, is 55 to 60%. The left ventricle has normal function. The left ventricle has no regional wall  motion abnormalities. The left ventricular internal cavity size was normal in size. There is  no left ventricular hypertrophy. Left ventricular diastolic parameters are indeterminate. Right Ventricle: The right ventricular size is normal. No increase in right ventricular wall thickness. Right ventricular systolic function is normal. There is moderately elevated pulmonary artery systolic pressure. The tricuspid regurgitant velocity is 3.19 m/s, and with an assumed right atrial pressure of 10 mmHg, the estimated right ventricular systolic pressure is 44.0 mmHg. Left Atrium: Left atrial size was  normal in size. Right Atrium: Right atrial size was normal in size. Pericardium: A small pericardial effusion is present. Mitral Valve: The mitral valve is normal in structure. Normal mobility of the mitral valve leaflets. No evidence of mitral valve regurgitation. No evidence of mitral valve stenosis. MV peak gradient, 8.1 mmHg. The mean mitral valve gradient is 3.0 mmHg. Tricuspid Valve: The tricuspid valve is normal in structure. Tricuspid valve regurgitation is mild . No evidence of tricuspid stenosis. Aortic Valve: The aortic valve was not well visualized. Aortic valve regurgitation is not visualized. Mild to moderate aortic valve sclerosis/calcification is present, without any evidence of aortic stenosis. Aortic valve mean gradient measures 6.0 mmHg.  Aortic valve peak gradient measures 10.9 mmHg. Aortic valve area, by VTI measures 1.67 cm. Pulmonic Valve: The pulmonic valve was normal in structure. Pulmonic valve regurgitation is not visualized. No evidence of pulmonic stenosis. Aorta: The aortic root is normal in size and structure. Venous: The inferior vena cava is dilated in size with less than 50% respiratory variability, suggesting right atrial pressure of 15 mmHg. IAS/Shunts: No atrial level shunt detected by color flow Doppler.  LEFT VENTRICLE PLAX 2D LVIDd:         4.57 cm  Diastology LVIDs:         3.36 cm  LV  e' lateral:   8.59 cm/s LV PW:         0.90 cm  LV E/e' lateral: 16.1 LV IVS:        0.66 cm  LV e' medial:    7.83 cm/s LVOT diam:     2.10 cm  LV E/e' medial:  17.7 LV SV:         42 LV SV Index:   28 LVOT Area:     3.46 cm  RIGHT VENTRICLE RV Basal diam:  3.32 cm LEFT ATRIUM             Index       RIGHT ATRIUM           Index LA diam:        3.30 cm 2.19 cm/m  RA Area:     16.30 cm LA Vol (A2C):   31.0 ml 20.59 ml/m RA Volume:   45.30 ml  30.09 ml/m LA Vol (A4C):   40.0 ml 26.57 ml/m LA Biplane Vol: 35.7 ml 23.72 ml/m  AORTIC VALVE                    PULMONIC VALVE AV Area (Vmax):    1.74 cm     PV Vmax:       1.06 m/s AV Area (Vmean):   1.64 cm     PV Vmean:      61.600 cm/s AV Area (VTI):     1.67 cm     PV VTI:        0.131 m AV Vmax:           165.00 cm/s  PV Peak grad:  4.5 mmHg AV Vmean:          108.000 cm/s PV Mean grad:  2.0 mmHg AV VTI:            0.249 m AV Peak Grad:      10.9 mmHg AV Mean Grad:      6.0 mmHg LVOT Vmax:         83.00 cm/s LVOT Vmean:        51.200 cm/s LVOT VTI:  0.120 m LVOT/AV VTI ratio: 0.48  AORTA Ao Root diam: 3.10 cm MITRAL VALVE                TRICUSPID VALVE MV Area (PHT): 4.37 cm     TR Peak grad:   40.7 mmHg MV Peak grad:  8.1 mmHg     TR Vmax:        319.00 cm/s MV Mean grad:  3.0 mmHg MV Vmax:       1.42 m/s     SHUNTS MV Vmean:      81.1 cm/s    Systemic VTI:  0.12 m MV Decel Time: 174 msec     Systemic Diam: 2.10 cm MV E velocity: 138.67 cm/s Ida Rogue MD Electronically signed by Ida Rogue MD Signature Date/Time: 04/18/2020/11:01:31 AM    Final       ASSESSMENT/PLAN   Bilateral pulmonary well circumscribed spiculated solitary nodules    -possible malignancy vs related to previous TB exposure    - will place on schedule to evaluate in pulmonary clinic post hospitalization     - will do IGRA /quantiferon for now as well as other etiology including fungal     Acute COVID19 pneumonia -Remdesevir antiviral - pharmacy protocol 5  d -vitamin C -zinc -decadron 6mg  IV daily  -Diuresis - Lasix 40 IV daily - monitor UOP - utilize external urinary catheter if possible -Self prone if patient can tolerate  -encourage to use IS and Acapella device for bronchopulmonary hygiene when able -consider Actemra if CRP increments >20 -d/c hepatotoxic medications while on remdesevir -supportive care with ICU telemetry monitoring -PT/OT when possible -procalcitonin, CRP and ferritin trending      Thank you for allowing me to participate in the care of this patient.   This document was prepared using Dragon voice recognition software and may include unintentional dictation errors.     Ottie Glazier, M.D.  Division of [place]

## 2020-04-21 NOTE — Progress Notes (Signed)
PROGRESS NOTE    Clinton Gordon  [id number] DOB: 02-Jun-1930 DOA: 04/17/2020 PCP: Tracie Harrier, MD     Brief Narrative:  Clinton Gordon is an 86 y.o. male with PMH significant for COPD, HTN, CAD, BPH, essential thrombocytosis and remote history of TB who was in his usual state of health until 4 days ago when he developed malaise and cough.  Patient states 2 days ago he became short of breath and was having difficulty breathing even at rest.  Patient denies any nausea vomiting or diarrhea.  Denies abdominal pain.  Denies chest pain.  His main concern is difficulty catching his breath.  Patient admits to cough, unclear whether it is productive.  Denies fevers or chills. Patient states he has been vaccinated x2 for Covid.  ED Course:  The patient was noted to be afebrile with tachycardia.  He was somewhat hypoxic on room air but corrected to 95% with 2 L.  Chest x-ray showed multifocal pulmonary opacities consistent with multifocal pneumonia as well as a possible loculated left-sided effusion.  Covid test was positive.  Patient was started on steroids and remdesivir and admitted for ongoing    Subjective: 9/3 A/O x4, positive S OB but on room air now.  Negative CP, negative abdominal pain.   Assessment & Plan: Covid vaccination;   Principal Problem:   Pneumonia due to COVID-19 virus Active Problems:   Essential thrombocythemia ([place])   Hypertension   BPH without obstruction/lower urinary tract symptoms   Pulmonary hypertension ([place])  Acute respiratory failure with hypoxia/COPD COVID-19 Labs  Recent Labs    04/19/20 1018 04/20/20 0450 04/21/20 0736  FERRITIN 394* 315 319  LDH 364* 331* 383*  CRP  --  6.7* 5.5*    Lab Results  Component Value Date   SARSCOV2NAA POSITIVE (A) 04/17/2020   [place] Not Detected 07/19/2019    Covid pneumonia/COPD -Not on home O2 -Hydroxyurea 500 mg BID -Solu-Medrol 40 mg BID -Remdesivir per pharmacy protocol -Combivent  QID -Titrate O2 to maintain SPO2> 88% -Patient will be considered contagious until 9/21.  Patient  will need to take all general precautions such as masking, frequent handwashing, quarantining within house, etc.  Schedule follow-up appointment with Dr. Zetta Bills pulmonologist who evaluated patient in hospital.  Patient will require biopsy and per note IGRA/quantify for now as well as other etiology  including fungal.  Abnormal chest CT with nodules -Patient has spiculated nodules with adenopathy on chest CT which is concerning for malignancy However patient's son Dr [name] states he has history of TB in the past -9/3 discussed case with critical care and Dr [name].  Critical care agreed to see patient to determine when appropriate for biopsy and follow-up.   New onset atrial fibrillation -CHADS2VASC score=4 -Rate control -Imdur 30 mg daily -Metoprolol 25 mg BID -Lovenox treatment dose -Transition to Eliquis  Pulmonary hypertension -Patient has moderate pulmonary HTN by echocardiogram see results below -See A. fib .    CAD No chest pain, no evidence for ACS Continue aspirin, metoprolol, atorvastatin, Imdur  HTN -Imdur 30 mg daily  -Losartan 25 mg daily -Metoprolol 25 mg BID  BPH Continue Flomax   DVT prophylaxis: Lovenox Code Status: Full Family Communication:  Status is: Inpatient    Dispo: The patient is from: Home              Anticipated d/c is to: Home              Anticipated d/c date  is: 9/5              Patient currently unstable      Consultants:  Critical care  Procedures/Significant Events:  8/30 CT chest W0 contrast;Widespread multifocal areas of mixed interstitial, ground-glass and consolidative opacities with a peripheral and basilar predominance, with a peripheral and basilar predominance. Findings are favored to represent multifocal pneumonia in the setting of COVID-19 positivity. -Bilateral spiculated masslike opacities in both  upper lobes as well as more nodular intermediate to soft tissue attenuation pleural thickening in the upper lungs including a region contiguous with more diffuse conglomerate in infiltrative soft tissue attenuation along the right paramediastinal margin concerning for underlying malignancy/metastatic disease. -Additional mediastinal nodes concerning for metastatic adenopathy. -Indeterminate nodule of the left adrenal gland measuring up to 1.6 cm in size, concerning for metastatic disease. -Somewhat indeterminate sclerotic focus in the left sixth rib laterally. Additional mixed sclerotic and lucent lesion seen in the T11 spinous process as well. Could reflect osseous metastatic disease. -Indeterminate 1 cm mass in the right renal fossa. Possible cyst from the upper pole right kidney. - Ascending aortic dilatation to 4.1 cm. Recommend annual imaging followup by CTA or MRA.  8/31 echocardiogram;Left Ventricle: LVEF= 55 to 60%.  -Left ventricular diastolic parameters are indeterminate.  - moderately elevated pulmonary artery systolic pressure. estimated right ventricular systolic pressure is 62.9 mmHg.     I have personally reviewed and interpreted all radiology studies and my findings are as above.  VENTILATOR SETTINGS: Nasal cannula 9/2 Flow 2 L/min SPO2 94%   Cultures  Antimicrobials: Anti-infectives (From admission, onward)   Start     Ordered Stop   04/18/20 1000  remdesivir 100 mg in sodium chloride 0.9 % 100 mL IVPB       "Followed by" Linked Group Details   04/17/20 1443 04/22/20 0959   04/17/20 1600  remdesivir 200 mg in sodium chloride 0.9% 250 mL IVPB       "Followed by" Linked Group Details   04/17/20 1443 04/17/20 1659   04/17/20 1230  cefTRIAXone (ROCEPHIN) 2 g in sodium chloride 0.9 % 100 mL IVPB  Status:  Discontinued        04/17/20 1218 04/17/20 1725   04/17/20 1230  azithromycin (ZITHROMAX) 500 mg in sodium chloride 0.9 % 250 mL IVPB  Status:  Discontinued         04/17/20 1218 04/17/20 1725       Devices    LINES / TUBES:      Continuous Infusions: . sodium chloride       Objective: Vitals:   04/22/20 0251 04/22/20 0448 04/22/20 0519 04/22/20 0706  BP: (!) 89/56 107/69  (!) 105/59  Pulse: (!) 102 90  90  Resp: 20 19  19   Temp: 98.4 F (36.9 C)  98.9 F (37.2 C) 97.6 F (36.4 C)  TempSrc: Oral  Oral   SpO2: 97% 100%  96%  Weight:      Height:        Intake/Output Summary (Last 24 hours) at 04/22/2020 0748 Last data filed at 04/22/2020 0000 Gross per 24 hour  Intake --  Output 750 ml  Net -750 ml   Filed Weights   04/17/20 1017 04/17/20 1915  Weight: 51.7 kg 51.7 kg    Examination:  General: A/O x4, positive acute respiratory distress Eyes: negative scleral hemorrhage, negative anisocoria, negative icterus ENT: Negative Runny nose, negative gingival bleeding, Neck:  Negative scars, masses, torticollis, lymphadenopathy, JVD Lungs: decreased breath  sounds bilaterally without wheezes or crackles Cardiovascular: Regular rate and rhythm without murmur gallop or rub normal S1 and S2 Abdomen: negative abdominal pain, nondistended, positive soft, bowel sounds, no rebound, no ascites, no appreciable mass Extremities: No significant cyanosis, clubbing, or edema bilateral lower extremities Skin: Negative rashes, lesions, ulcers Psychiatric:  Negative depression, negative anxiety, negative fatigue, negative mania  Central nervous system:  Cranial nerves II through XII intact, tongue/uvula midline, all extremities muscle strength 5/5, sensation intact throughout, negative dysarthria, negative expressive aphasia, negative receptive aphasia.  .     Data Reviewed: Care during the described time interval was provided by me .  I have reviewed this patient's available data, including medical history, events of note, physical examination, and all test results as part of my evaluation.  CBC: Recent Labs  Lab 04/17/20 1006  04/18/20 0556 04/19/20 0456 04/20/20 0450 04/21/20 0736  WBC 10.3 9.7 16.9* 17.0* 17.4*  NEUTROABS  --  8.2* 14.5* 14.0* 14.4*  HGB 12.0* 12.2* 12.5* 12.5* 14.1  HCT 35.6* 35.4* 37.1* 36.7* 40.0  MCV 95.4 93.9 95.4 93.9 91.5  PLT 411* 420* 574* 605* 502*   Basic Metabolic Panel: Recent Labs  Lab 04/17/20 1006 04/18/20 0556 04/19/20 0456 04/19/20 1018 04/20/20 0450 04/21/20 0736  NA 131* 132* 130*  --  131* 131*  K 4.9 5.1 4.8  --  4.4 4.3  CL 99 103 97*  --  100 98  CO2 25 22 24   --  25 23  GLUCOSE 112* 130* 110*  --  106* 130*  BUN 17 19 31*  --  30* 25*  CREATININE 0.91 0.95 1.06  --  0.80 0.83  CALCIUM 8.3* 8.3* 8.5*  --  8.3* 8.6*  MG 2.0  --   --  2.0 1.9 2.0  PHOS  --   --   --  3.5 3.4 3.2   GFR: Estimated Creatinine Clearance: 45.9 mL/min (by C-G formula based on SCr of 0.83 mg/dL). Liver Function Tests: Recent Labs  Lab 04/18/20 0556 04/19/20 0456 04/20/20 0450 04/21/20 0736  AST 33 38 33 30  ALT 22 23 24 25   ALKPHOS 80 83 75 84  BILITOT 1.4* 1.5* 1.3* 1.4*  PROT 6.3* 6.5 5.8* 6.3*  ALBUMIN 2.8* 2.9* 2.6* 2.9*   No results for input(s): LIPASE, AMYLASE in the last 168 hours. No results for input(s): AMMONIA in the last 168 hours. Coagulation Profile: Recent Labs  Lab 04/17/20 1908  INR 1.2   Cardiac Enzymes: No results for input(s): CKTOTAL, CKMB, CKMBINDEX, TROPONINI in the last 168 hours. BNP (last 3 results) No results for input(s): PROBNP in the last 8760 hours. HbA1C: No results for input(s): HGBA1C in the last 72 hours. CBG: No results for input(s): GLUCAP in the last 168 hours. Lipid Profile: No results for input(s): CHOL, HDL, LDLCALC, TRIG, CHOLHDL, LDLDIRECT in the last 72 hours. Thyroid Function Tests: No results for input(s): TSH, T4TOTAL, FREET4, T3FREE, THYROIDAB in the last 72 hours. Anemia Panel: Recent Labs    04/20/20 0450 04/21/20 0736  FERRITIN 315 319   Sepsis Labs: Recent Labs  Lab 04/17/20 1305  04/17/20 1459  PROCALCITON  --  <0.10  LATICACIDVEN 1.5  --     Recent Results (from the past 240 hour(s))  SARS Coronavirus 2 by RT PCR (hospital order, performed in [facility] hospital lab) Nasopharyngeal Nasopharyngeal Swab     Status: Abnormal   Collection Time: 04/17/20 12:02 PM   Specimen: Nasopharyngeal Swab  Result Value  Ref Range Status   SARS Coronavirus 2 POSITIVE (A) NEGATIVE Final    Comment: RESULT CALLED TO, READ BACK BY AND VERIFIED WITH: GREG MOYER,RN 1412 04/17/2020 DB (NOTE) SARS-CoV-2 target nucleic acids are DETECTED  SARS-CoV-2 RNA is generally detectable in upper respiratory specimens  during the acute phase of infection.  Positive results are indicative  of the presence of the identified virus, but do not rule out bacterial infection or co-infection with other pathogens not detected by the test.  Clinical correlation with patient history and  other diagnostic information is necessary to determine patient infection status.  The expected result is negative.  Fact Sheet for Patients:   StrictlyIdeas.no   Fact Sheet for Healthcare Providers:   BankingDealers.co.za    This test is not yet approved or cleared by the [place] FDA and  has been authorized for detection and/or diagnosis of SARS-CoV-2 by FDA under an Emergency Use Authorization (EUA).  This EUA will remain in effect (meaning this test  can be used) for the duration of  the COVID-19 declaration under Section 564(b)(1) of the Act, 21 U.S.C. section 360-bbb-3(b)(1), unless the authorization is terminated or revoked sooner.  Performed at [facility], [address] [place], [place] [address]   Blood Culture (routine x 2)     Status: None   Collection Time: 04/17/20 12:02 PM   Specimen: BLOOD  Result Value Ref Range Status   Specimen Description BLOOD LEFT ARM  Final   Special Requests   Final    BOTTLES DRAWN AEROBIC AND ANAEROBIC  Blood Culture adequate volume   Culture   Final    NO GROWTH 5 DAYS Performed at [facility], [address], [place], [place] [address]    Report Status 04/22/2020 FINAL  Final  Urine culture     Status: None   Collection Time: 04/17/20  1:05 PM   Specimen: In/Out Cath Urine  Result Value Ref Range Status   Specimen Description   Final    IN/OUT CATH URINE Performed at [facility], [address], [place], [place] [address]    Special Requests   Final    NONE Performed at [facility], [address], [place], [place] [address]    Culture   Final    NO GROWTH Performed at [facility], [place] [address], [place], [place] [address]    Report Status 04/18/2020 FINAL  Final  Blood Culture (routine x 2)     Status: None   Collection Time: 04/17/20  7:08 PM   Specimen: BLOOD  Result Value Ref Range Status   Specimen Description BLOOD RIGHT ANTECUBITAL  Final   Special Requests   Final    BOTTLES DRAWN AEROBIC AND ANAEROBIC Blood Culture adequate volume   Culture   Final    NO GROWTH 5 DAYS Performed at [facility], [address], [place], [place] [address]    Report Status 04/22/2020 FINAL  Final         Radiology Studies: No results found.      Scheduled Meds: . aspirin EC  81 mg Oral Daily  . atorvastatin  20 mg Oral Daily  . enoxaparin (LOVENOX) injection  50 mg Subcutaneous Q12H  . hydroxyurea  500 mg Oral BID  . Ipratropium-Albuterol  1 puff Inhalation Q6H  . isosorbide mononitrate  30 mg Oral Daily  . losartan  25 mg Oral Daily  . methylPREDNISolone (SOLU-MEDROL) injection  40 mg Intravenous Q12H  .  metoprolol tartrate  25 mg Oral BID  . sodium chloride flush  3 mL Intravenous Q12H  . tamsulosin  0.4 mg Oral QPC supper   Continuous Infusions: . sodium chloride       LOS: 5 days    Time spent:40 min    Selyna Klahn, Geraldo Docker, MD [facility] Pager [phone]  If 7PM-7AM, please contact  night-coverage [email or web address] Password [facility] 04/22/2020, 7:48 AM

## 2020-04-21 NOTE — Consult Note (Signed)
[place] for Lovenox Indication: atrial fibrillation  No Known Allergies  Patient Measurements: Height: 5\' 2"  (157.5 cm) Weight: 51.7 kg (113 lb 15.7 oz) IBW/kg (Calculated) : 54.6  Vital Signs: Temp: 97.6 F (36.4 C) (09/03 0804) Temp Source: Oral (09/03 0804) BP: 150/96 (09/03 0804) Pulse Rate: 104 (09/03 0804)  Labs: Recent Labs    04/19/20 0456 04/19/20 0456 04/20/20 0450 04/21/20 0736  HGB 12.5*   < > 12.5* 14.1  HCT 37.1*  --  36.7* 40.0  PLT 574*  --  605* 703*  CREATININE 1.06  --  0.80 0.83   < > = values in this interval not displayed.    Estimated Creatinine Clearance: 45.9 mL/min (by C-G formula based on SCr of 0.83 mg/dL).   Medications:  No anticoagulation prior to admission per chart review  Assessment: Patient is an 86 y/o M with medical history including essential thrombocythemia who is admitted with COVID-19 pneumonia. Patient noted to be in atrial fibrillation. Pharmacy has been consulted to initiate therapeutic Lovenox for atrial fibrillation.   Baseline CBC within normal limits except for platelet count of 703.    Plan:  --Lovenox 50 mg (1 mg/kg) q12h --CBC per protocol --Monitor renal function and adjust as indicated   Lu Duffel, PharmD, BCPS Clinical Pharmacist 04/21/2020 9:05 AM

## 2020-04-22 ENCOUNTER — Inpatient Hospital Stay: Payer: Medicare Other

## 2020-04-22 DIAGNOSIS — M79652 Pain in left thigh: Secondary | ICD-10-CM

## 2020-04-22 DIAGNOSIS — R29898 Other symptoms and signs involving the musculoskeletal system: Secondary | ICD-10-CM | POA: Diagnosis present

## 2020-04-22 DIAGNOSIS — U071 COVID-19: Secondary | ICD-10-CM | POA: Diagnosis not present

## 2020-04-22 DIAGNOSIS — D473 Essential (hemorrhagic) thrombocythemia: Secondary | ICD-10-CM | POA: Diagnosis not present

## 2020-04-22 DIAGNOSIS — R0602 Shortness of breath: Secondary | ICD-10-CM | POA: Diagnosis not present

## 2020-04-22 DIAGNOSIS — M79659 Pain in unspecified thigh: Secondary | ICD-10-CM

## 2020-04-22 DIAGNOSIS — E861 Hypovolemia: Secondary | ICD-10-CM

## 2020-04-22 DIAGNOSIS — N400 Enlarged prostate without lower urinary tract symptoms: Secondary | ICD-10-CM | POA: Diagnosis not present

## 2020-04-22 DIAGNOSIS — M79651 Pain in right thigh: Secondary | ICD-10-CM

## 2020-04-22 DIAGNOSIS — J189 Pneumonia, unspecified organism: Secondary | ICD-10-CM | POA: Diagnosis not present

## 2020-04-22 DIAGNOSIS — S7010XA Contusion of unspecified thigh, initial encounter: Secondary | ICD-10-CM

## 2020-04-22 DIAGNOSIS — I272 Pulmonary hypertension, unspecified: Secondary | ICD-10-CM | POA: Diagnosis present

## 2020-04-22 DIAGNOSIS — I9589 Other hypotension: Secondary | ICD-10-CM

## 2020-04-22 LAB — COMPREHENSIVE METABOLIC PANEL
ALT: 22 U/L (ref 0–44)
AST: 26 U/L (ref 15–41)
Albumin: 2.6 g/dL — ABNORMAL LOW (ref 3.5–5.0)
Alkaline Phosphatase: 65 U/L (ref 38–126)
Anion gap: 7 (ref 5–15)
BUN: 34 mg/dL — ABNORMAL HIGH (ref 8–23)
CO2: 25 mmol/L (ref 22–32)
Calcium: 7.9 mg/dL — ABNORMAL LOW (ref 8.9–10.3)
Chloride: 98 mmol/L (ref 98–111)
Creatinine, Ser: 0.86 mg/dL (ref 0.61–1.24)
GFR calc Af Amer: >60 mL/min (ref >60)
GFR calc non Af Amer: >60 mL/min (ref >60)
Glucose, Bld: 154 mg/dL — ABNORMAL HIGH (ref 70–99)
Potassium: 4.1 mmol/L (ref 3.5–5.1)
Sodium: 130 mmol/L — ABNORMAL LOW (ref 135–145)
Total Bilirubin: 1.2 mg/dL (ref 0.3–1.2)
Total Protein: 5.1 g/dL — ABNORMAL LOW (ref 6.5–8.1)

## 2020-04-22 LAB — CBC WITH DIFFERENTIAL/PLATELET
Abs Immature Granulocytes: 1.98 10*3/uL — ABNORMAL HIGH (ref 0.00–0.07)
Basophils Absolute: 0.1 10*3/uL (ref 0.0–0.1)
Basophils Relative: 0 %
Eosinophils Absolute: 0 10*3/uL (ref 0.0–0.5)
Eosinophils Relative: 0 %
HCT: 31 % — ABNORMAL LOW (ref 39.0–52.0)
Hemoglobin: 10.6 g/dL — ABNORMAL LOW (ref 13.0–17.0)
Immature Granulocytes: 7 %
Lymphocytes Relative: 8 %
Lymphs Abs: 2.2 10*3/uL (ref 0.7–4.0)
MCH: 32.1 pg (ref 26.0–34.0)
MCHC: 34.2 g/dL (ref 30.0–36.0)
MCV: 93.9 fL (ref 80.0–100.0)
Monocytes Absolute: 2.5 10*3/uL — ABNORMAL HIGH (ref 0.1–1.0)
Monocytes Relative: 8 %
Neutro Abs: 22.5 10*3/uL — ABNORMAL HIGH (ref 1.7–7.7)
Neutrophils Relative %: 77 %
Platelets: 887 10*3/uL — ABNORMAL HIGH (ref 150–400)
RBC: 3.3 MIL/uL — ABNORMAL LOW (ref 4.22–5.81)
RDW: 28.6 % — ABNORMAL HIGH (ref 11.5–15.5)
Smear Review: INCREASED
WBC: 29.2 10*3/uL — ABNORMAL HIGH (ref 4.0–10.5)
nRBC: 0.2 % (ref 0.0–0.2)

## 2020-04-22 LAB — BLOOD GAS, ARTERIAL
Acid-Base Excess: 1.3 mmol/L (ref 0.0–2.0)
Bicarbonate: 24.7 mmol/L (ref 20.0–28.0)
FIO2: 0.28
O2 Saturation: 96.5 %
Patient temperature: 37
pCO2 arterial: 34 mmHg (ref 32.0–48.0)
pH, Arterial: 7.47 — ABNORMAL HIGH (ref 7.350–7.450)
pO2, Arterial: 80 mmHg — ABNORMAL LOW (ref 83.0–108.0)

## 2020-04-22 LAB — CULTURE, BLOOD (ROUTINE X 2)
Culture: NO GROWTH
Culture: NO GROWTH
Special Requests: ADEQUATE
Special Requests: ADEQUATE

## 2020-04-22 LAB — PHOSPHORUS: Phosphorus: 3.9 mg/dL (ref 2.5–4.6)

## 2020-04-22 LAB — C-REACTIVE PROTEIN: CRP: 2.4 mg/dL — ABNORMAL HIGH (ref <1.0)

## 2020-04-22 LAB — MAGNESIUM: Magnesium: 2.1 mg/dL (ref 1.7–2.4)

## 2020-04-22 LAB — FERRITIN: Ferritin: 271 ng/mL (ref 24–336)

## 2020-04-22 LAB — FIBRIN DERIVATIVES D-DIMER (ARMC ONLY): Fibrin derivatives D-dimer (ARMC): 411.06 ng/mL (FEU) (ref 0.00–499.00)

## 2020-04-22 LAB — LACTATE DEHYDROGENASE: LDH: 326 U/L — ABNORMAL HIGH (ref 98–192)

## 2020-04-22 IMAGING — DX DG CHEST 1V PORT
1 series · 1 of 1 positions shown · non-contrast
Comparison: Five days ago

CLINICAL DATA: Unresponsive this morning.  COVID positive

EXAM:
PORTABLE CHEST 1 VIEW

[chest ap]
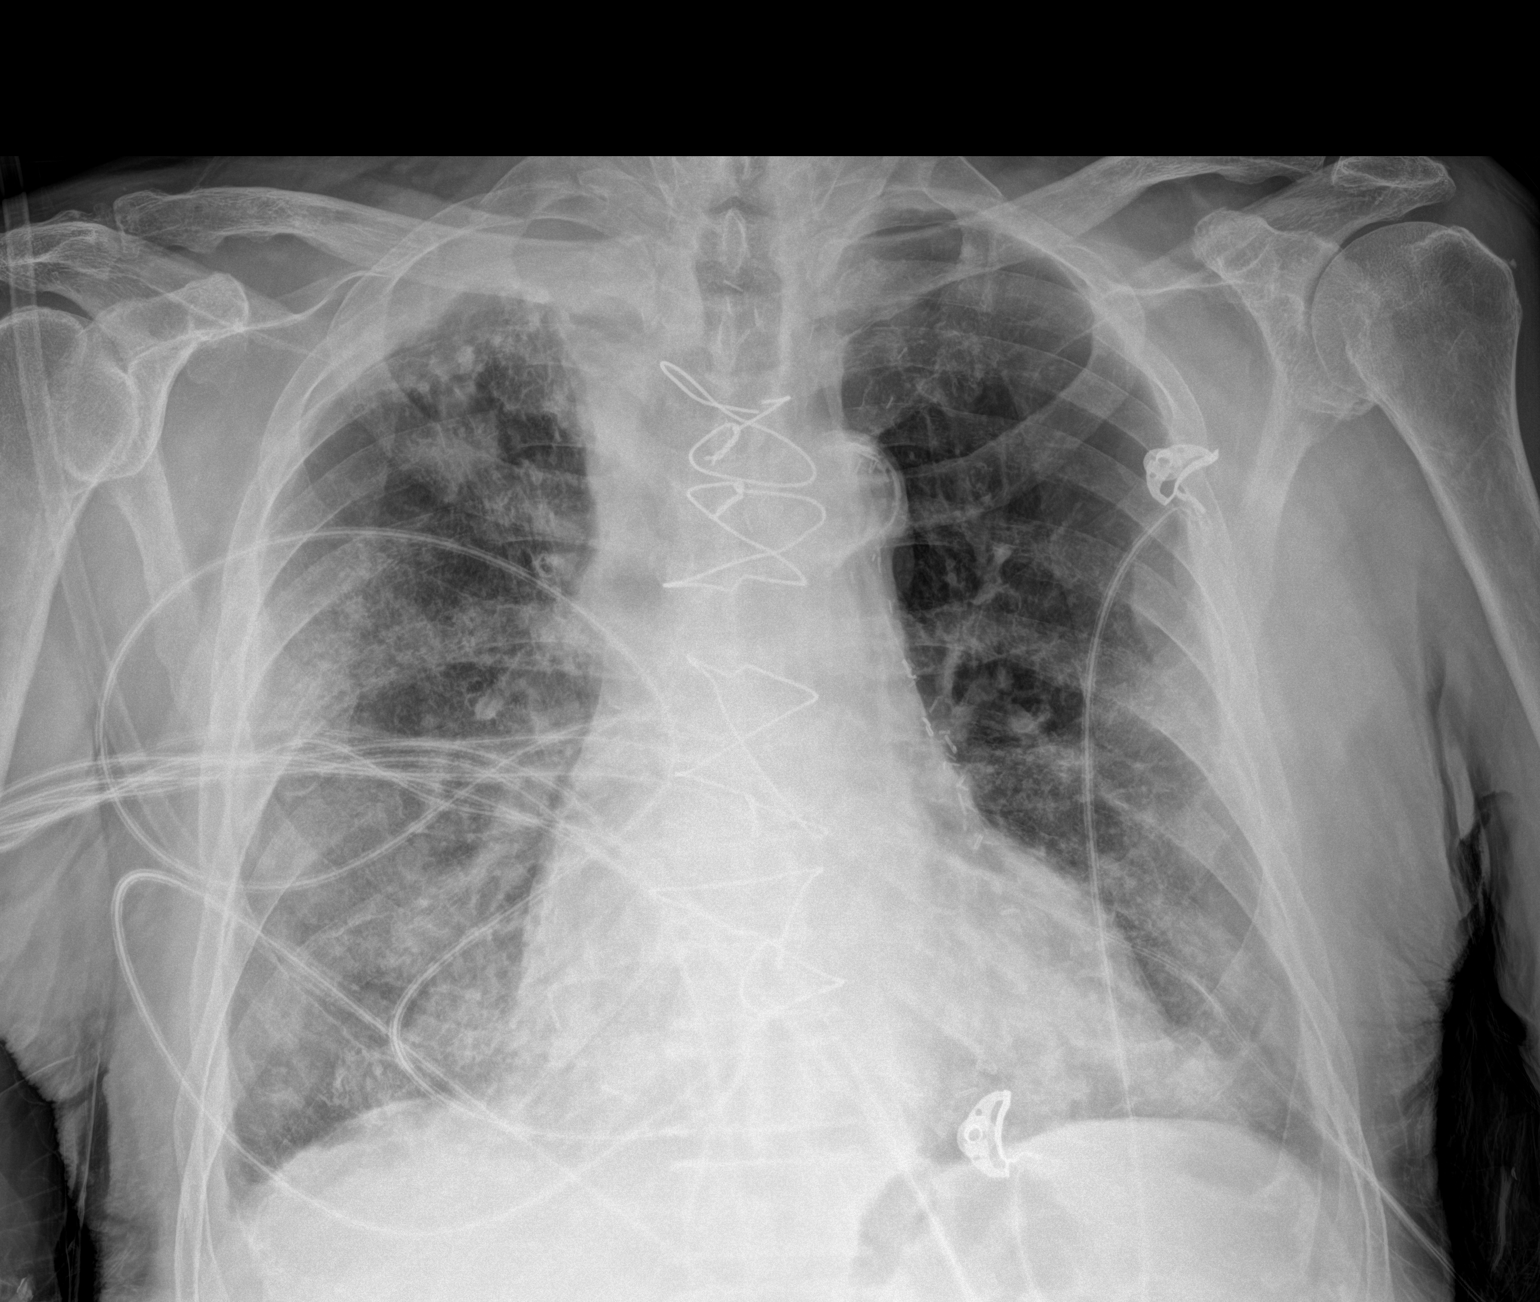

[1 of 1 positions shown; findings below may reference images not displayed]

FINDINGS: Patchy bilateral pneumonia with similar distribution but likely
decreasing density. Normal heart size. There has been CABG. No
visible effusion or air leak. Hyperinflation in the setting of COPD.
Calcification and pleural thickening asymmetric at the right apex.
IMPRESSION: Bilateral pneumonia with mildly improved aeration. Reference recent
chest CT concerning the findings of possible intrathoracic
malignancy.

## 2020-04-22 IMAGING — MR MR LUMBAR SPINE WO/W CM
5 of 7 series · 34 of 48 positions shown · IV contrast (gadavist)
Comparison: Chest CT [DATE]

:
CLINICAL DATA: Cough and shortness of breath. Spiculated nodules in
adenopathy on chest CT.

EXAM:
MRI THORACIC AND LUMBAR SPINE WITHOUT AND WITH CONTRAST
TECHNIQUE: Multiplanar and multiecho pulse sequences of the thoracic and lumbar
spine were obtained without and with intravenous contrast.
CONTRAST: 7.5mL GADAVIST GADOBUTROL 1 MMOL/ML IV SOLN

[Series 16: T2 · sagittal · 4.0mm · 0.81mm/px · 6 of 17 slices shown (1 of 2)]
[im 1/17]
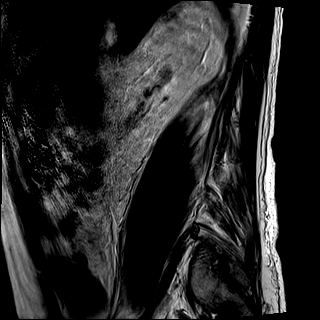
[im 4/17]
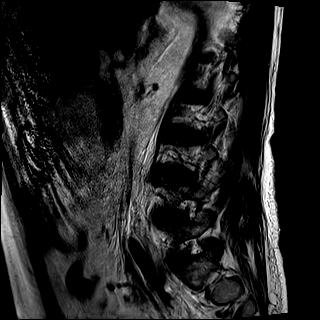
[im 7/17]
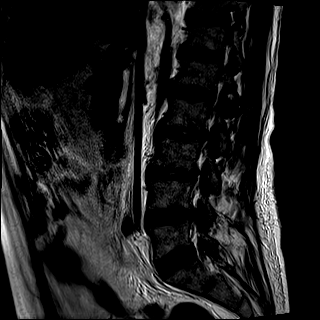
[im 10/17]
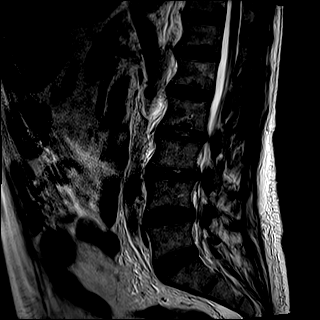
[im 13/17]
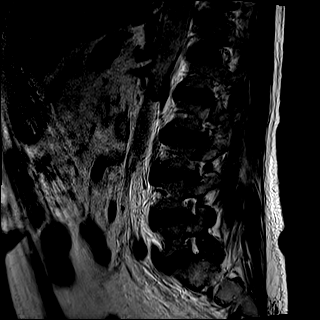
[im 17/17]
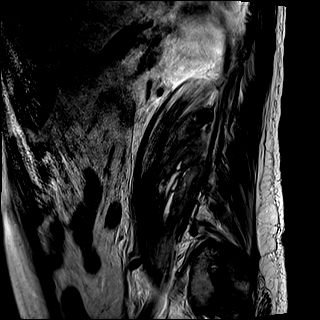

[Series 17: T1 · sagittal · 4.0mm · 0.81mm/px · 5 of 17 slices shown (1 of 2)]
[im 1/17]
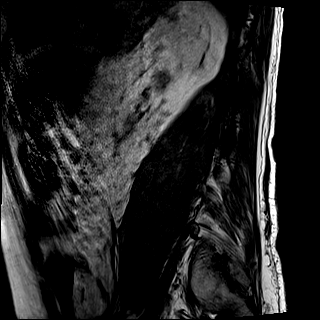
[im 5/17]
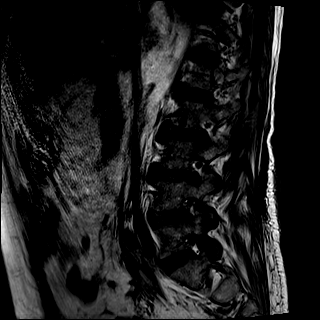
[im 9/17]
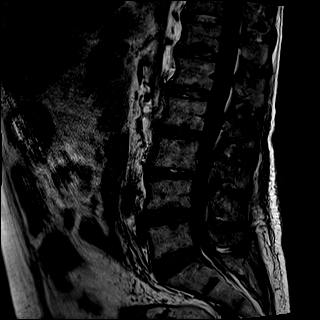
[im 13/17]
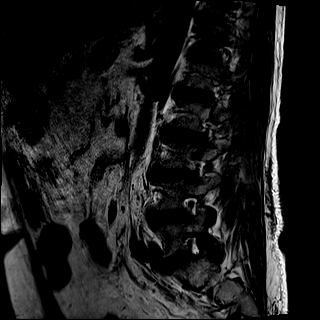
[im 17/17]
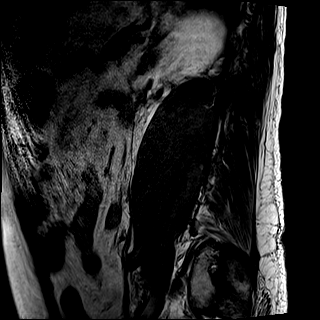

[Series 19: T2 · axial · 4.0mm · 0.78mm/px · z∈[-518,-332]mm · 9 of 32 slices shown (2 of 2)]
[im 1/32]
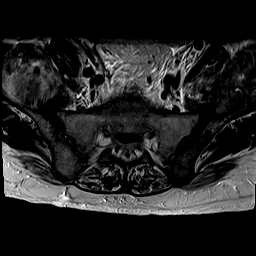
[im 4/32]
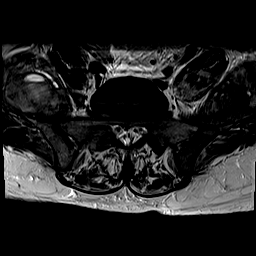
[im 8/32]
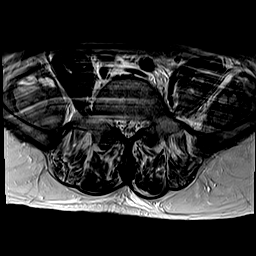
[im 12/32]
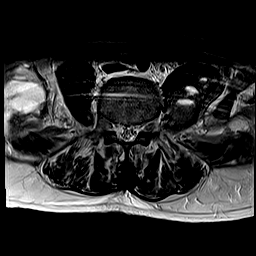
[im 16/32]
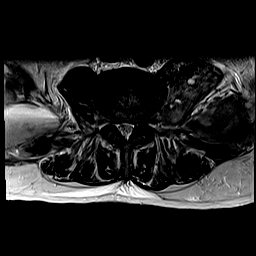
[im 20/32]
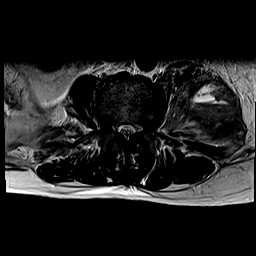
[im 24/32]
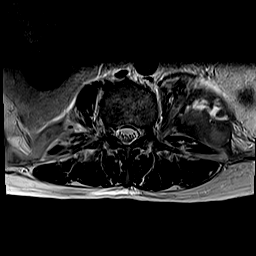
[im 28/32]
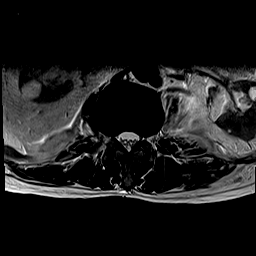
[im 32/32]
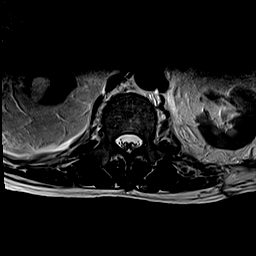

[Series 20: T1 · axial · 4.0mm · 0.39mm/px · z∈[-518,-332]mm · 9 of 32 slices shown (2 of 2)]
[im 1/32]
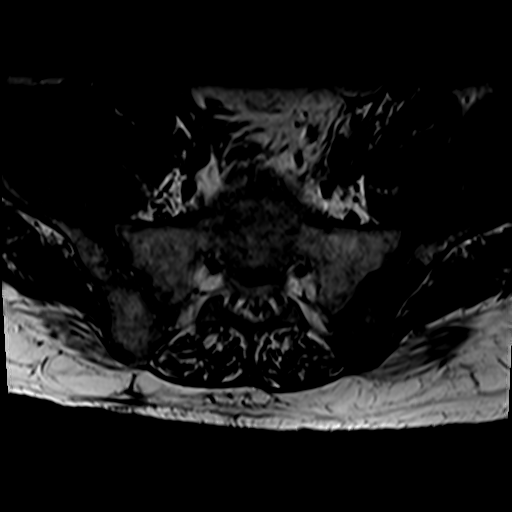
[im 4/32]
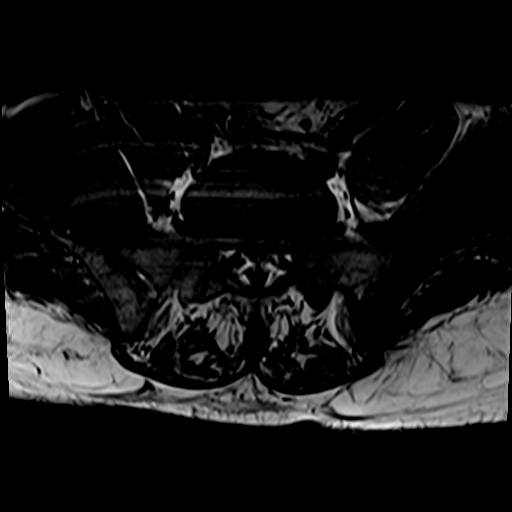
[im 8/32]
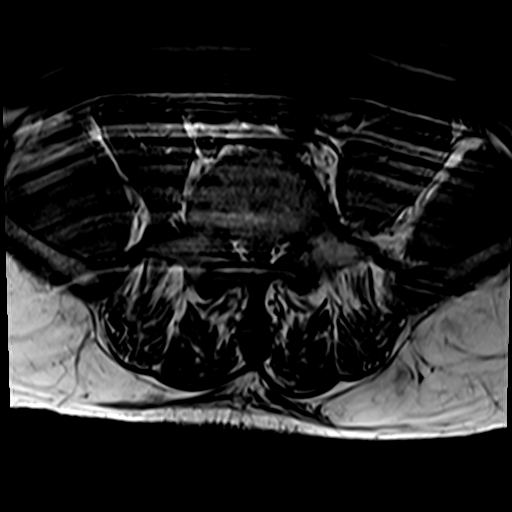
[im 12/32]
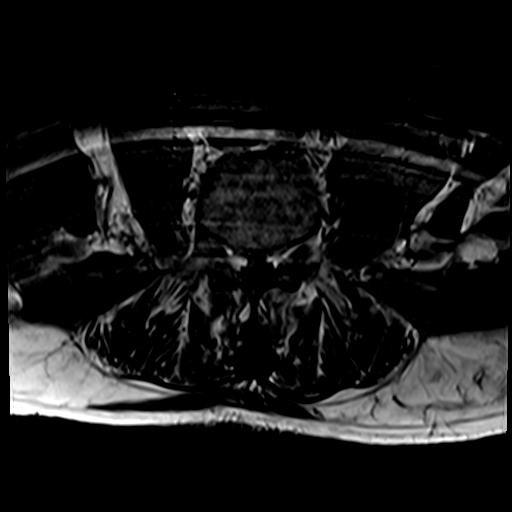
[im 16/32]
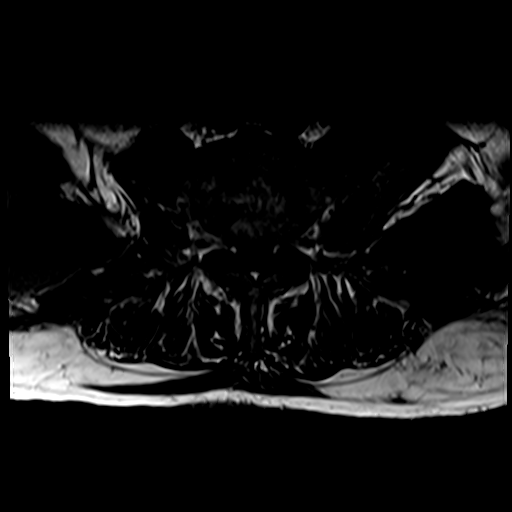
[im 20/32]
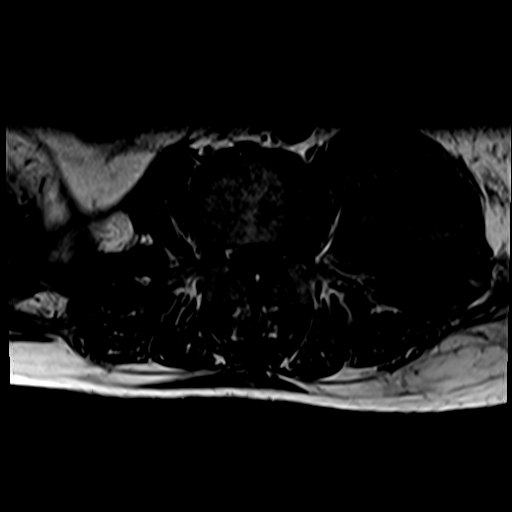
[im 24/32]
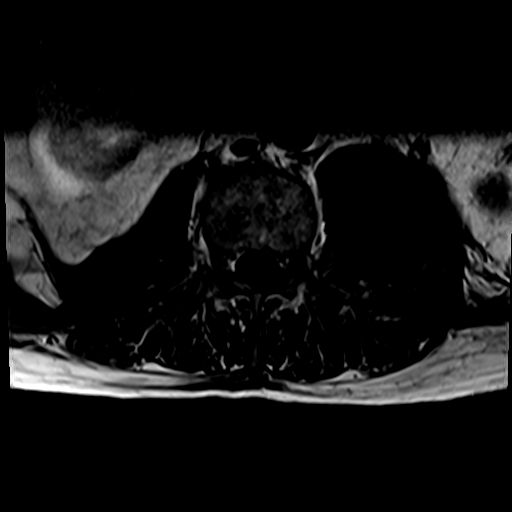
[im 28/32]
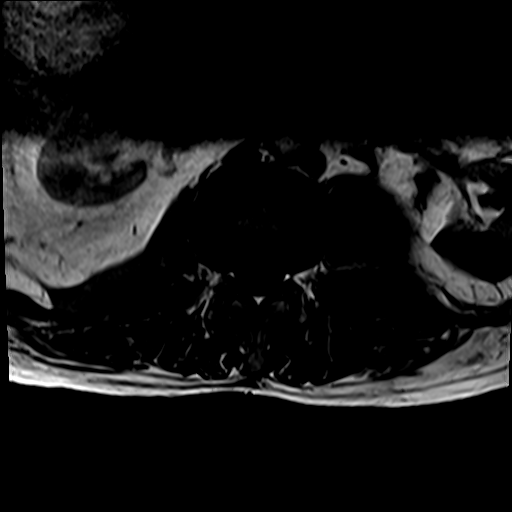
[im 32/32]
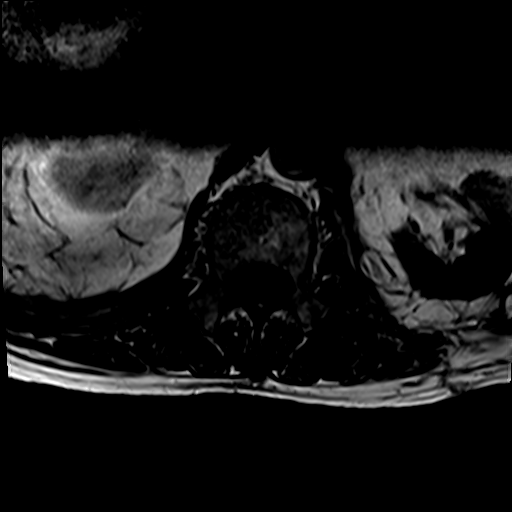

[Series 21: T1 fat-sat post-contrast · sagittal · 4.0mm · 0.81mm/px · 5 of 17 slices shown]
[im 1/17]
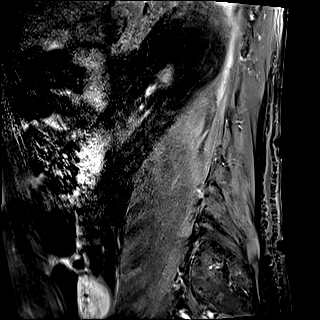
[im 5/17]
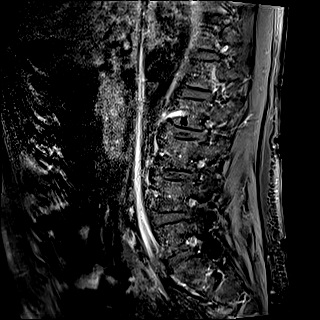
[im 9/17]
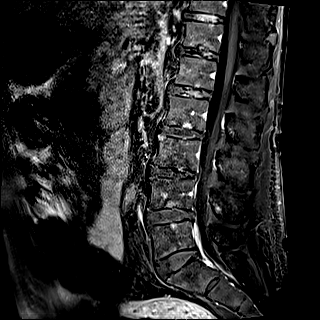
[im 13/17]
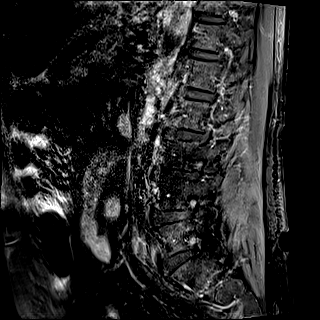
[im 17/17]
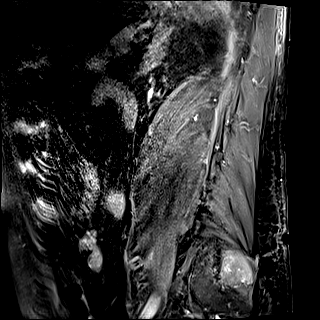

[34 of 48 positions shown; findings below may reference images not displayed]

FINDINGS: MRI THORACIC SPINE FINDINGS

Alignment: Normal

Vertebrae: Sclerotic focus in the T11 spinous process without
contrast enhancement. Bone marrow signal otherwise normal.

Cord: Normal signal and morphology.

Paraspinal and other soft tissues: Pleural effusions with right
medial loculation. Incompletely visualized spiculated areas of
distortion in the right upper lobe.

Disc levels:

No spinal canal or neural foraminal stenosis.

MRI LUMBAR SPINE FINDINGS

Segmentation: Standard.

Alignment: Physiologic.

Vertebrae: No fracture, evidence of discitis, or bone lesion.

Conus medullaris: Extends to the L1 level and appears normal.

Paraspinal and other soft tissues: Multiple fluid collections within
the psoas and iliacus muscles, right greater than left.

Disc levels:

L1-2: Disc desiccation without stenosis.

L2-3: Mild disc bulge without spinal canal stenosis. Mild bilateral
foraminal narrowing.

L3-4: Left asymmetric intermediate sized disc bulge. Mild spinal
canal stenosis and mild left foraminal stenosis.

L4-5: Intermediate disc bulge with no spinal canal stenosis. Mild
right foraminal stenosis.

L5-S1: Mild disc bulge and mild facet hypertrophy. No spinal canal
stenosis. No neural foraminal stenosis.
IMPRESSION: 1. No evidence of metastatic disease to the thoracic or lumbar
spine.
2. Multiple fluid collections within the psoas and iliacus muscles,
right greater than left, consistent with intramuscular hematomas.
Dedicated imaging of this region is recommended.
3. Pleural effusions.
4. Mild multilevel lumbar degenerative disc disease with mild spinal
canal stenosis at L3-4.

## 2020-04-22 IMAGING — MR MR PELVIS WO/W CM
5 of 8 series · 30 of 48 positions shown · IV contrast (gadavist)
Comparison: CT abdomen/pelvis dated [DATE]

CLINICAL DATA: Thrombocytosis, malaise, cough, possible thoracic
malignancy

EXAM:
MRI PELVIS WITHOUT AND WITH CONTRAST
TECHNIQUE: Multiplanar multisequence MR imaging of the pelvis was performed
both before and after administration of intravenous contrast.
CONTRAST:  7.5mL GADAVIST GADOBUTROL 1 MMOL/ML IV SOLN

[Series 23: T1 · axial · 4.0mm · 0.74mm/px · z∈[-663,-428]mm · 6 of 48 slices shown (1 of 2)]
[im 1/48]
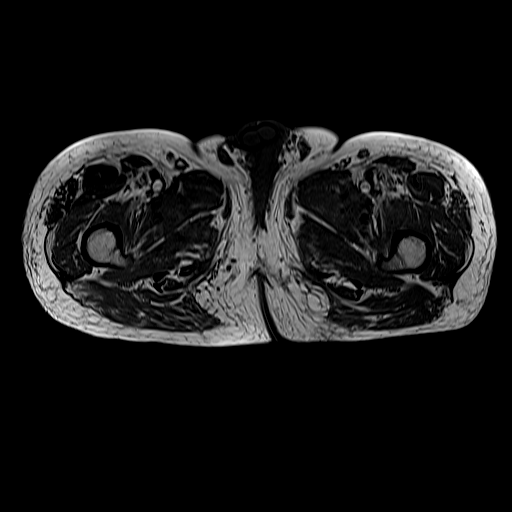
[im 10/48]
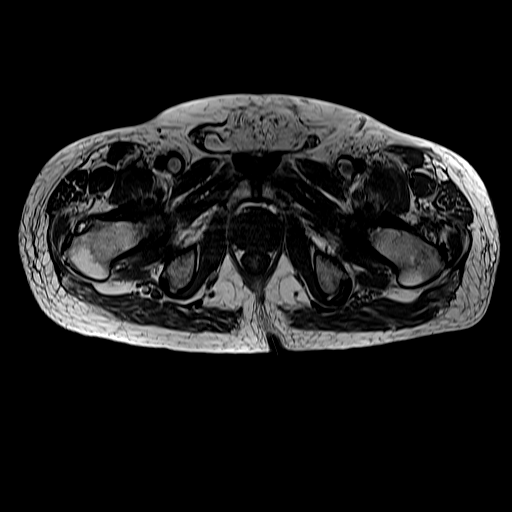
[im 19/48]
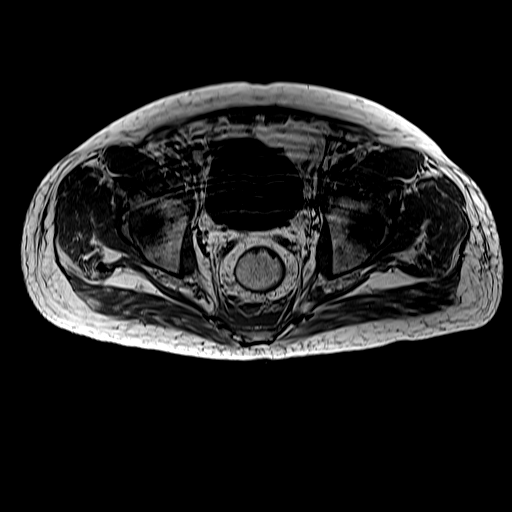
[im 29/48]
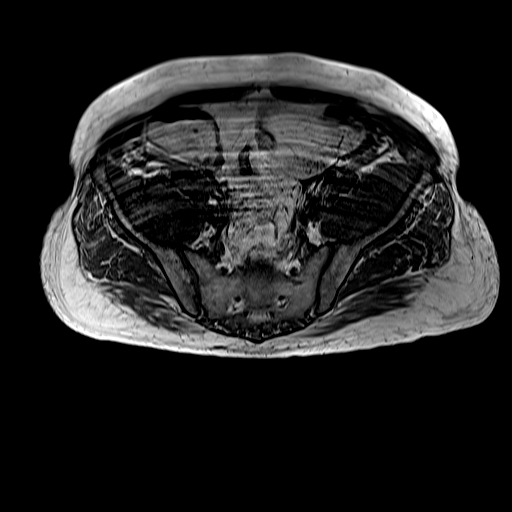
[im 38/48]
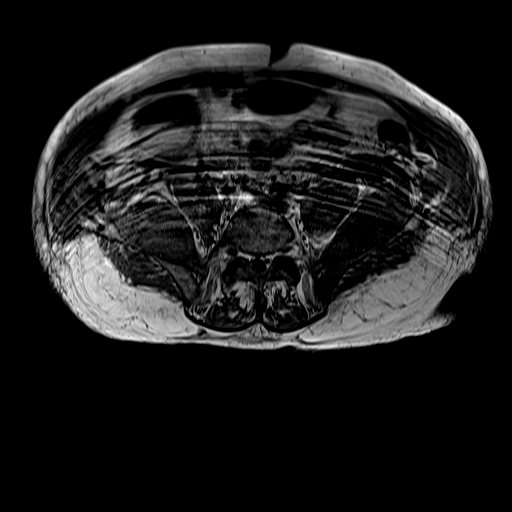
[im 48/48]
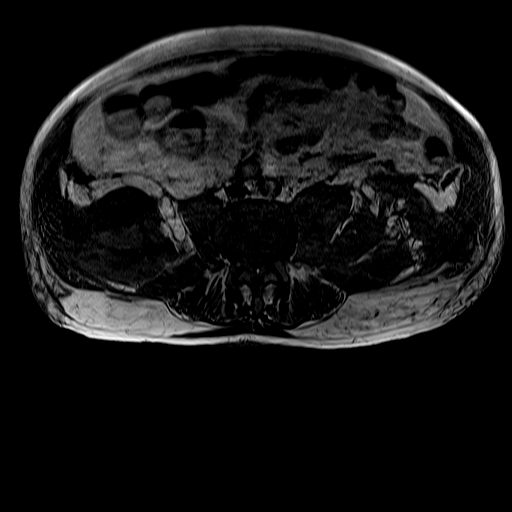

[Series 24: T2 fat-sat · axial · 4.0mm · 0.74mm/px · z∈[-663,-428]mm · 6 of 48 slices shown (1 of 2)]
[im 1/48]
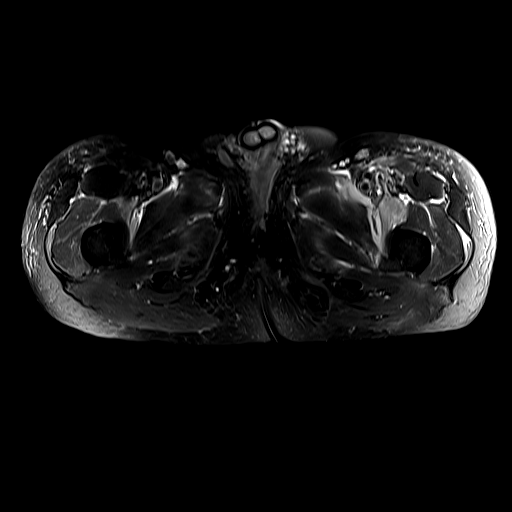
[im 10/48]
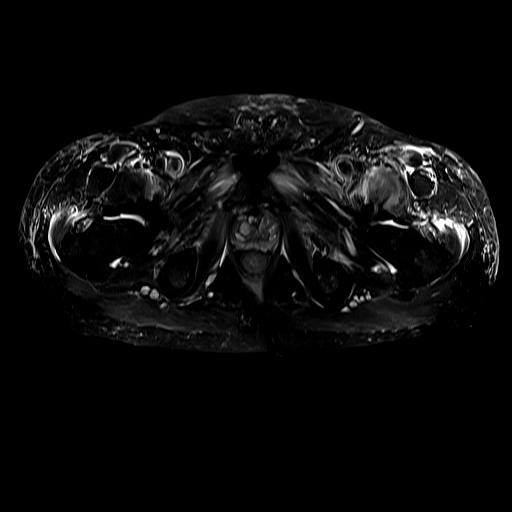
[im 19/48]
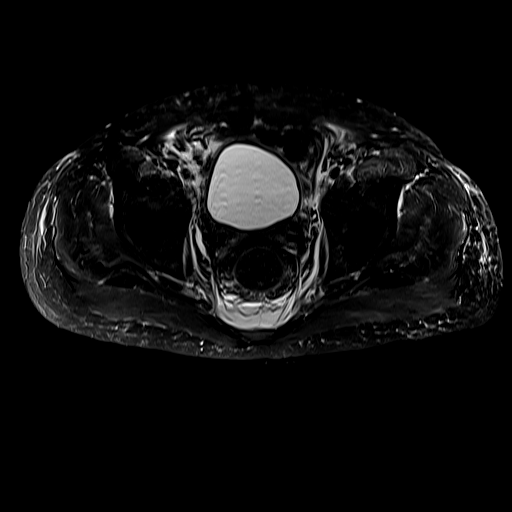
[im 29/48]
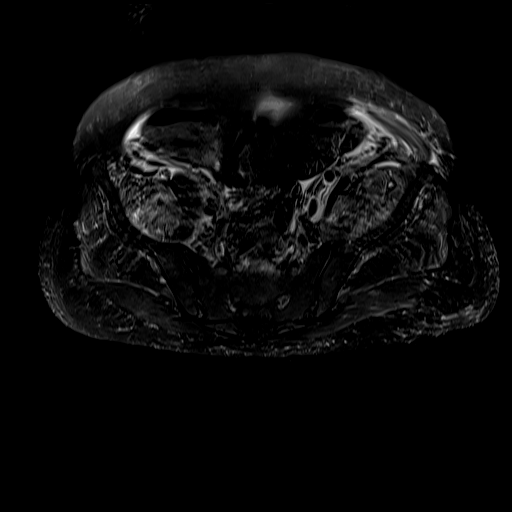
[im 38/48]
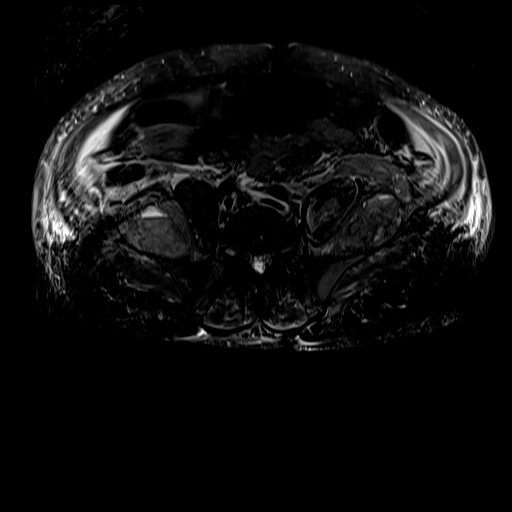
[im 48/48]
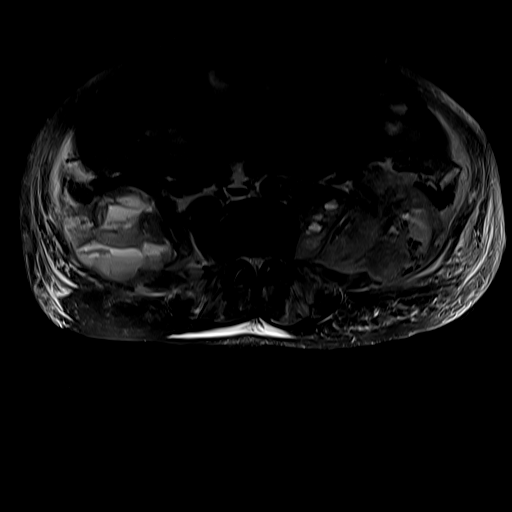

[Series 25: STIR · coronal · 4.0mm · 1.19mm/px · 5 of 36 slices shown]
[im 1/36]
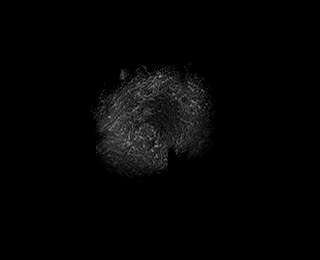
[im 9/36]
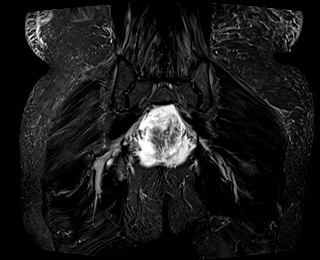
[im 18/36]
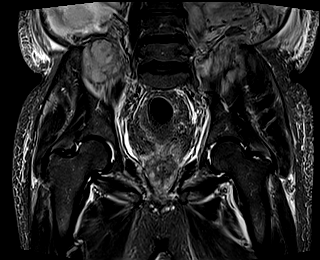
[im 27/36]
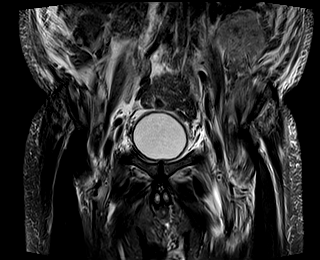
[im 36/36]
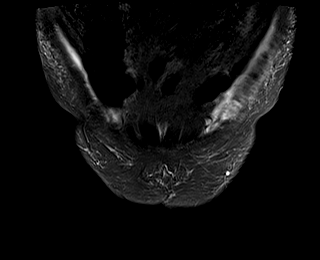

[Series 27: T2 fat-sat · sagittal · 4.0mm · 0.94mm/px · 9 of 68 slices shown (2 of 2)]
[im 1/68]
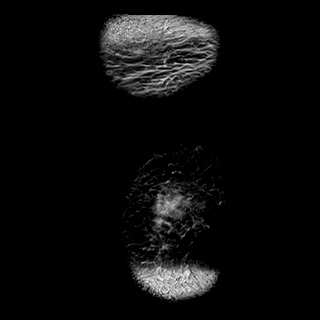
[im 9/68]
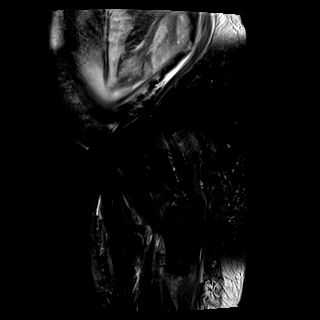
[im 17/68]
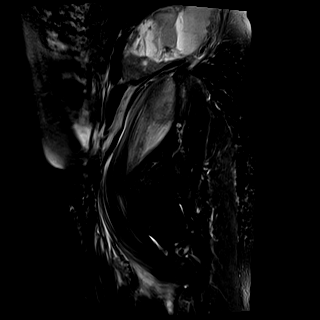
[im 26/68]
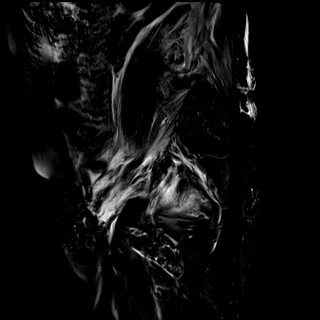
[im 34/68]
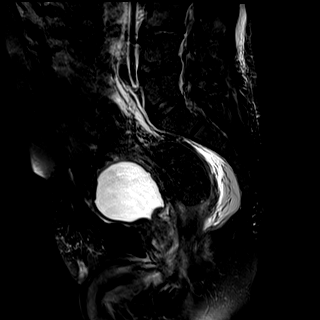
[im 42/68]
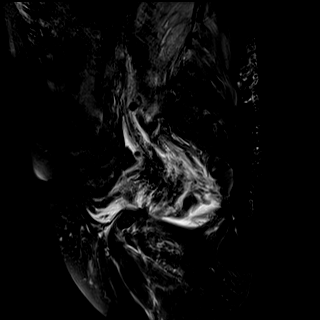
[im 51/68]
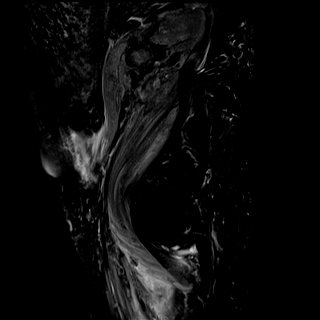
[im 59/68]
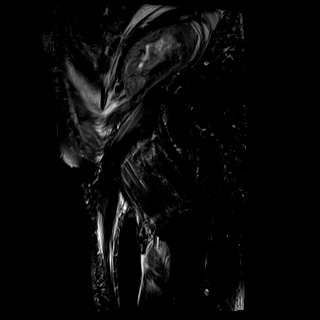
[im 68/68]
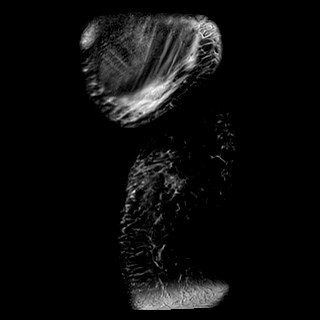

[Series 28: T1 · coronal · 4.0mm · 1.19mm/px · 4 of 36 slices shown (2 of 2)]
[im 1/36]
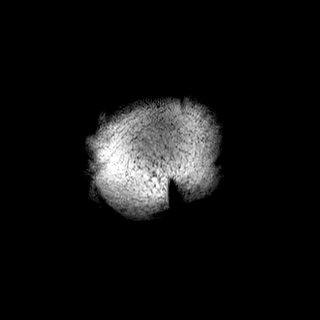
[im 9/36]
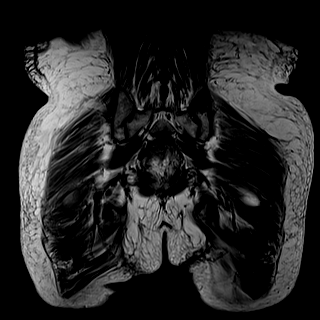
[im 18/36]
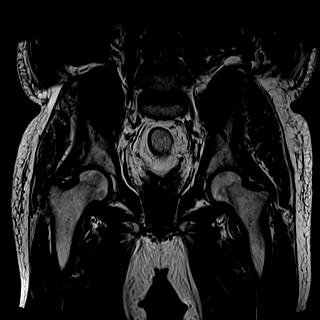
[im 27/36]
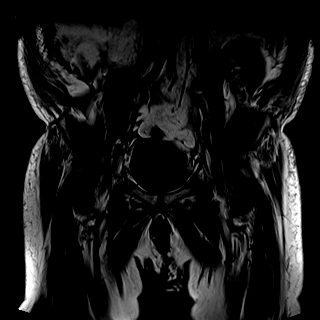

[30 of 48 positions shown; findings below may reference images not displayed]

FINDINGS: Urinary Tract:  Bladder is within normal limits.

Bowel:  Visualized bowel is grossly unremarkable.

Vascular/Lymphatic: No evidence of aneurysm.

No suspicious pelvic lymphadenopathy.

Reproductive:  Prostate is unremarkable.

Other: Intramuscular hematomas within the bilateral psoas and
iliacus muscles, right greater than left, incompletely visualized.
Largest axial measurement 7.2 x 7.4 cm on the right. Associated
layering retroperitoneal fluid bilaterally. Layering fluid-fluid
level in the dependent pelvis.

Musculoskeletal: Body wall edema.  No focal osseous lesions.
IMPRESSION: Intramuscular hematomas within the bilateral psoas and iliacus
muscles, right greater than left, incompletely visualized. Correlate
with laboratory evaluation if patient is anticoagulated.

## 2020-04-22 IMAGING — MR MR THORACIC SPINE WO/W CM
6 of 9 series · 27 of 48 positions shown · IV contrast (gadavist)
Comparison: Chest CT [DATE]

CLINICAL DATA: Cough and shortness of breath. Spiculated nodules in
adenopathy on chest CT.

EXAM:
MRI THORACIC AND LUMBAR SPINE WITHOUT AND WITH CONTRAST
TECHNIQUE: Multiplanar and multiecho pulse sequences of the thoracic and lumbar
spine were obtained without and with intravenous contrast.
CONTRAST:  7.5mL GADAVIST GADOBUTROL 1 MMOL/ML IV SOLN

[Series 18: T1 · sagittal · 6.0mm · 1.88mm/px · 1 of 9 slices shown (1 of 3)]
[im 1/9]
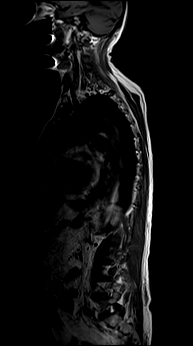

[Series 19: T2 · sagittal · 3.0mm · 1.06mm/px · 3 of 21 slices shown (1 of 2)]
[im 1/21]
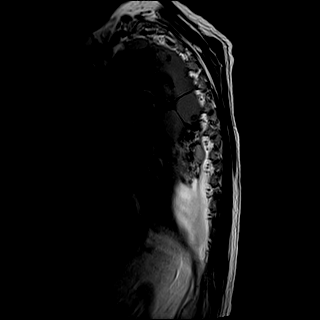
[im 11/21]
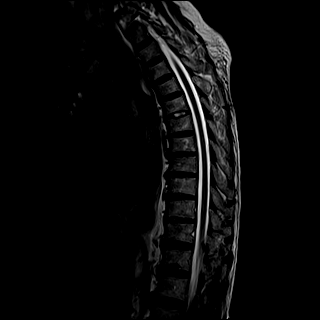
[im 21/21]
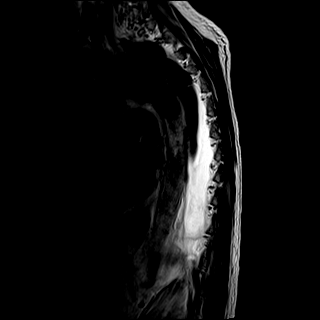

[Series 20: T1 · sagittal · 3.0mm · 1.06mm/px · 4 of 21 slices shown (2 of 3)]
[im 1/21]
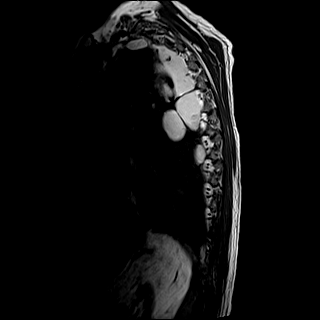
[im 7/21]
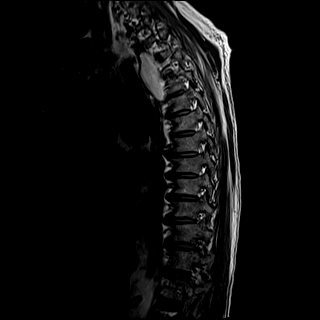
[im 14/21]
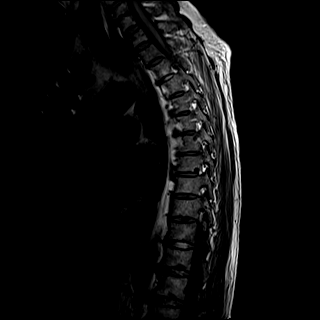
[im 21/21]
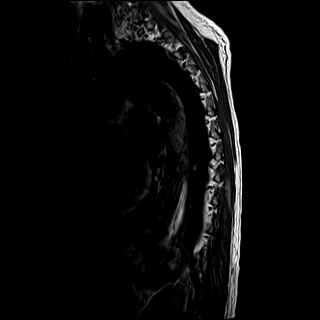

[Series 22: T2 · axial · 4.0mm · 0.59mm/px · z∈[-312,-101]mm · 8 of 39 slices shown (2 of 2)]
[im 1/39]
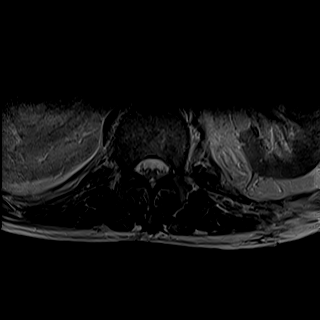
[im 6/39]
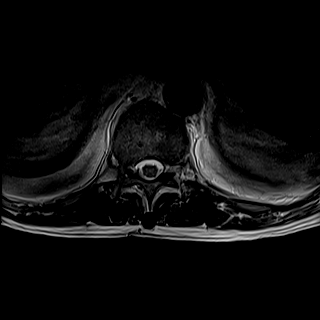
[im 11/39]
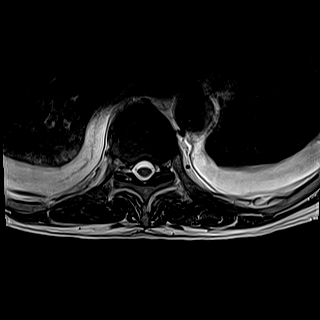
[im 17/39]
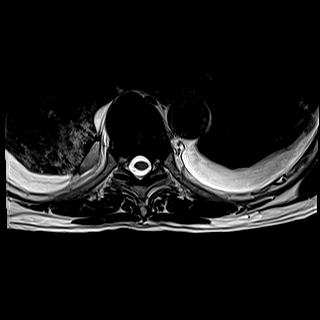
[im 22/39]
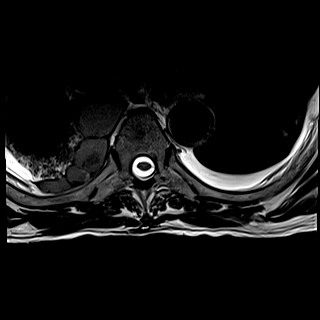
[im 28/39]
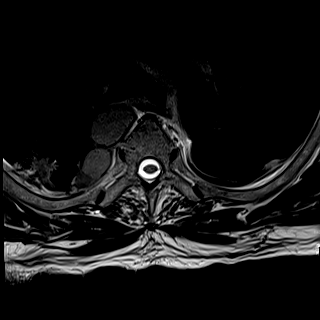
[im 33/39]
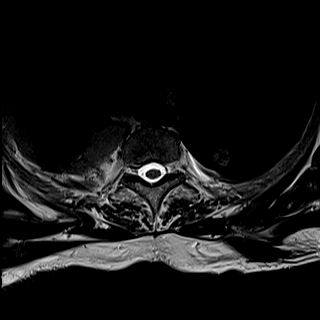
[im 39/39]
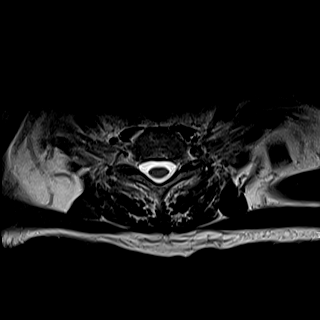

[Series 24: T1 · axial · non-contrast · 4.0mm · 0.31mm/px · z∈[-312,-101]mm · 8 of 39 slices shown (3 of 3)]
[im 1/39]
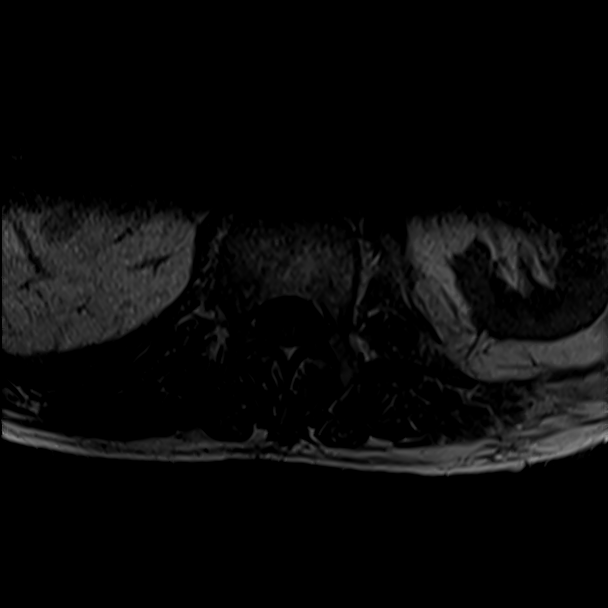
[im 6/39]
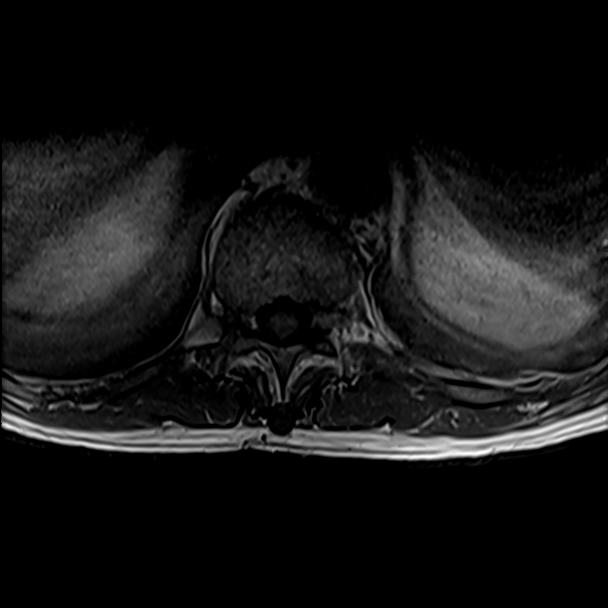
[im 11/39]
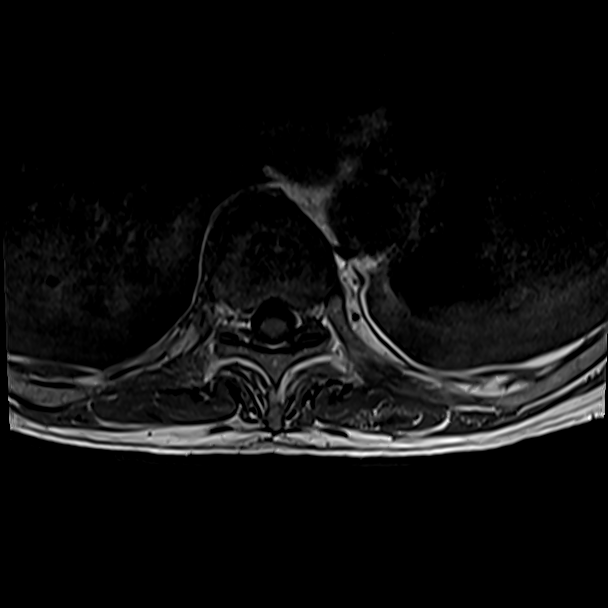
[im 17/39]
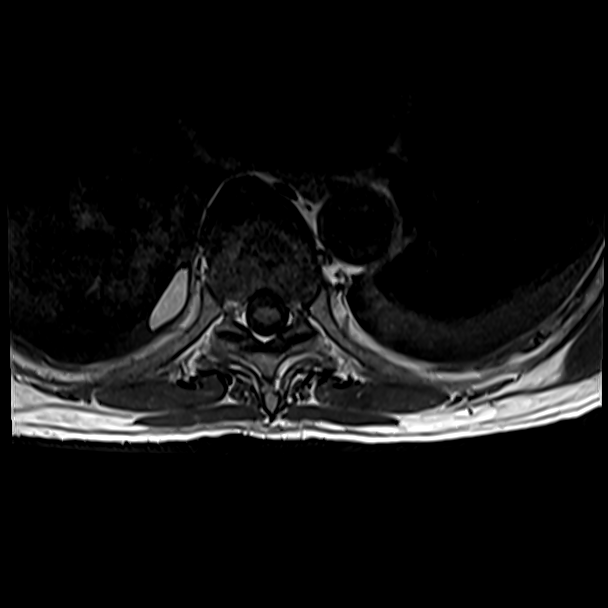
[im 22/39]
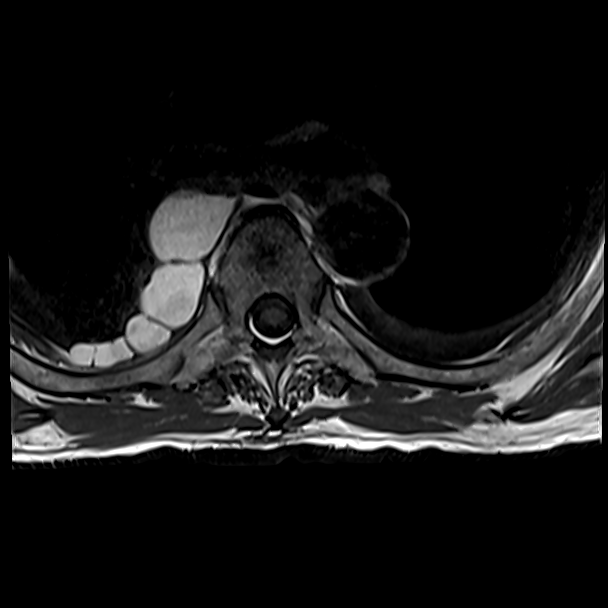
[im 28/39]
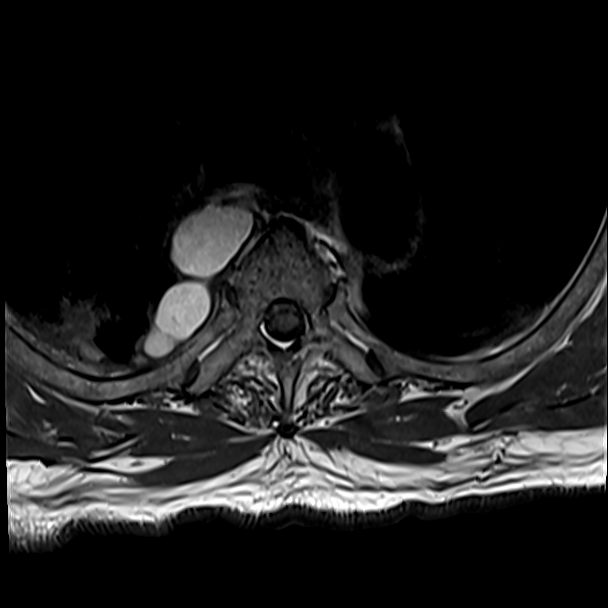
[im 33/39]
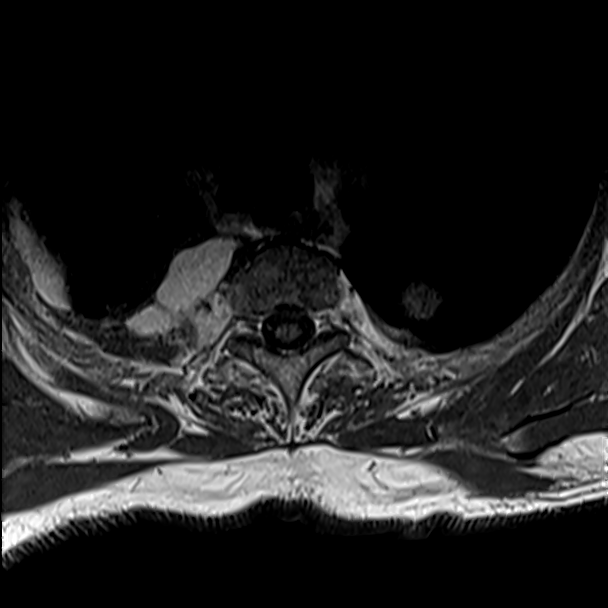
[im 39/39]
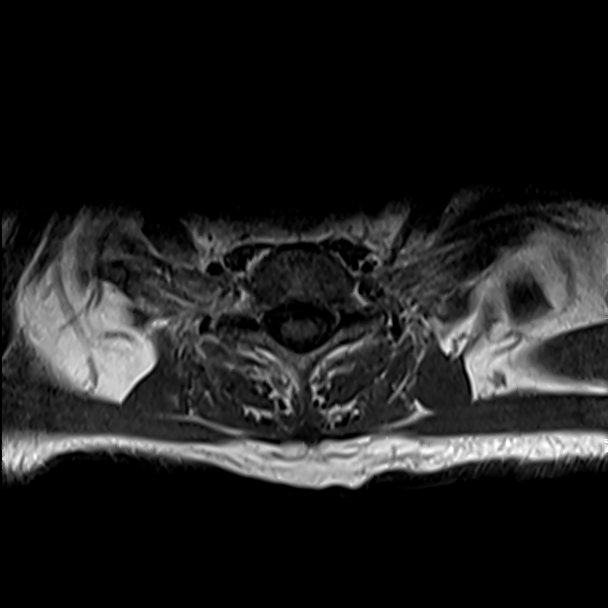

[Series 26: T1 fat-sat post-contrast · sagittal · 3.0mm · 1.06mm/px · 3 of 21 slices shown]
[im 1/21]
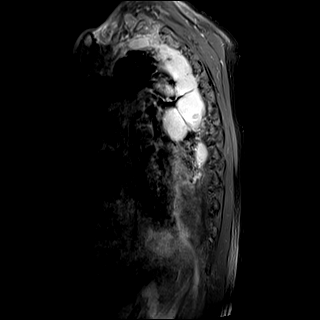
[im 7/21]
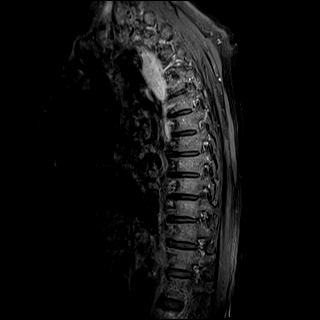
[im 14/21]
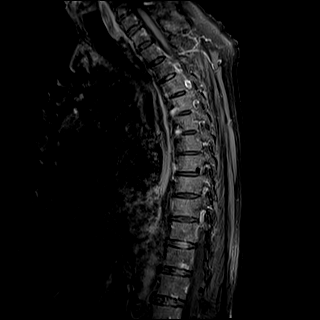

[27 of 48 positions shown; findings below may reference images not displayed]

FINDINGS: MRI THORACIC SPINE FINDINGS

Alignment:  Normal

Vertebrae: Sclerotic focus in the T11 spinous process without
contrast enhancement. Bone marrow signal otherwise normal.

Cord:  Normal signal and morphology.

Paraspinal and other soft tissues: Pleural effusions with right
medial loculation. Incompletely visualized spiculated areas of
distortion in the right upper lobe.

Disc levels:

No spinal canal or neural foraminal stenosis.

MRI LUMBAR SPINE FINDINGS

Segmentation:  Standard.

Alignment:  Physiologic.

Vertebrae:  No fracture, evidence of discitis, or bone lesion.

Conus medullaris: Extends to the L1 level and appears normal.

Paraspinal and other soft tissues: Multiple fluid collections within
the psoas and iliacus muscles, right greater than left.

Disc levels:

L1-2: Disc desiccation without stenosis.

L2-3: Mild disc bulge without spinal canal stenosis. Mild bilateral
foraminal narrowing.

L3-4: Left asymmetric intermediate sized disc bulge. Mild spinal
canal stenosis and mild left foraminal stenosis.

L4-5: Intermediate disc bulge with no spinal canal stenosis. Mild
right foraminal stenosis.

L5-S1: Mild disc bulge and mild facet hypertrophy. No spinal canal
stenosis. No neural foraminal stenosis.
IMPRESSION: 1. No evidence of metastatic disease to the thoracic or lumbar
spine.
2. Multiple fluid collections within the psoas and iliacus muscles,
right greater than left, consistent with intramuscular hematomas.
Dedicated imaging of this region is recommended.
3. Pleural effusions.
4. Mild multilevel lumbar degenerative disc disease with mild spinal
canal stenosis at L3-4.

## 2020-04-22 MED ORDER — APIXABAN 2.5 MG PO TABS
2.5000 mg | ORAL_TABLET | Freq: Two times a day (BID) | ORAL | Status: DC
  Administered 2020-04-22: 2.5 mg via ORAL
  Filled 2020-04-22: qty 1

## 2020-04-22 MED ORDER — KETOROLAC TROMETHAMINE 15 MG/ML IJ SOLN
15.0000 mg | Freq: Once | INTRAMUSCULAR | Status: AC
  Administered 2020-04-22: 15 mg via INTRAVENOUS
  Filled 2020-04-22: qty 1

## 2020-04-22 MED ORDER — DIGOXIN 0.25 MG/ML IJ SOLN
0.2500 mg | Freq: Once | INTRAMUSCULAR | Status: AC
  Administered 2020-04-22: 15:00:00 0.25 mg via INTRAVENOUS
  Filled 2020-04-22: qty 2

## 2020-04-22 MED ORDER — ISOSORBIDE MONONITRATE ER 30 MG PO TB24: 15.0000 mg | ORAL_TABLET | Freq: Every day | ORAL | Status: DC

## 2020-04-22 MED ORDER — SODIUM CHLORIDE 0.9 % IV BOLUS
1000.0000 mL | Freq: Once | INTRAVENOUS | Status: AC
  Administered 2020-04-22: 21:00:00 1000 mL via INTRAVENOUS

## 2020-04-22 MED ORDER — PANTOPRAZOLE SODIUM 40 MG PO TBEC
40.0000 mg | DELAYED_RELEASE_TABLET | Freq: Two times a day (BID) | ORAL | Status: DC
  Administered 2020-04-22 – 2020-04-27 (×10): 40 mg via ORAL
  Filled 2020-04-22 (×10): qty 1

## 2020-04-22 MED ORDER — LOSARTAN POTASSIUM 25 MG PO TABS: 12.5000 mg | ORAL_TABLET | Freq: Every day | ORAL | Status: DC

## 2020-04-22 MED ORDER — MORPHINE SULFATE (PF) 2 MG/ML IV SOLN
1.0000 mg | Freq: Four times a day (QID) | INTRAVENOUS | Status: DC | PRN
  Administered 2020-04-22: 1 mg via INTRAVENOUS
  Filled 2020-04-22: qty 1

## 2020-04-22 MED ORDER — ISOSORBIDE MONONITRATE ER 30 MG PO TB24: 30.0000 mg | ORAL_TABLET | Freq: Every day | ORAL | Status: DC

## 2020-04-22 MED ORDER — METOPROLOL TARTRATE 25 MG PO TABS
37.5000 mg | ORAL_TABLET | Freq: Two times a day (BID) | ORAL | Status: DC
  Administered 2020-04-23 – 2020-04-27 (×8): 37.5 mg via ORAL
  Filled 2020-04-22 (×9): qty 2

## 2020-04-22 MED ORDER — SODIUM CHLORIDE 0.9 % IV BOLUS: 500.0000 mL | Freq: Once | INTRAVENOUS | Status: DC

## 2020-04-22 MED ORDER — HYDROCODONE-ACETAMINOPHEN 5-325 MG PO TABS
1.0000 | ORAL_TABLET | Freq: Once | ORAL | Status: AC
  Administered 2020-04-22: 1 via ORAL
  Filled 2020-04-22: qty 1

## 2020-04-22 NOTE — Progress Notes (Signed)
Patient noted to rest on mattress in prone position without any difficulties noted. Call light within reach. Bed/chair alarm on and functioning without difficulties. Will endorse to oncoming RN

## 2020-04-22 NOTE — Progress Notes (Signed)
HR was up to 150's not sustaining at rest. HR flactuates 110-130's . He also c/o mascular pain in upper thighs and bilateral gluteal, he said its a new pain started yesterday. Stated "mascular pain".  Dr Sherral Hammers notified of the above.  MD will place orders.

## 2020-04-22 NOTE — Plan of Care (Signed)
In am this writer tried to use tele interpretor due to pt's limited English, but unable to use it due to loud noise from negative air pressure system.    Pt continues to c/o pain to bilateral upper thighs and gluteal area, L>R.  Tramadol and morphine given with improvement.    Digoxin given earlier for tachycardia A.fib, HR mostly in the 90's.   Spoke today to pt's son, Clinton Gordon for an update.

## 2020-04-22 NOTE — Progress Notes (Signed)
PROGRESS NOTE    Clinton Gordon  [id number] DOB: 1929-12-21 DOA: 04/17/2020 PCP: Tracie Harrier, MD     Brief Narrative:  Clinton Gordon is an 86 y.o. male with PMHx significant for COPD, HTN, CAD, BPH, essential thrombocytosis and remote history of TB who was in his usual state of health until 4 days ago when he developed malaise and cough.  Patient states 2 days ago he became short of breath and was having difficulty breathing even at rest.  Patient denies any nausea vomiting or diarrhea.  Denies abdominal pain.  Denies chest pain.  His main concern is difficulty catching his breath.  Patient admits to cough, unclear whether it is productive.  Denies fevers or chills. Patient states he has been vaccinated x2 for Covid.  ED Course:  The patient was noted to be afebrile with tachycardia.  He was somewhat hypoxic on room air but corrected to 95% with 2 L.  Chest x-ray showed multifocal pulmonary opacities consistent with multifocal pneumonia as well as a possible loculated left-sided effusion.  Covid test was positive.  Patient was started on steroids and remdesivir and admitted for ongoing    Subjective: 9/4 A/O x4, paged to bedside by RN secondary to patient being found sleeping on the floor.  SPO2 mid 50s initially patient difficult to arouse.  Once patient was placed back in bed which was now to assist complain new pain in bilateral buttocks thighs and hip area along with weakness which had not been there yesterday on exam.  Patient now back on 2 L O2   Assessment & Plan: Covid vaccination;   Principal Problem:   Pneumonia due to COVID-19 virus Active Problems:   Essential thrombocythemia ([place])   Hypertension   BPH without obstruction/lower urinary tract symptoms   Pulmonary hypertension ([place])   Lower extremity weakness   Thigh pain  Acute respiratory failure with hypoxia/COPD COVID-19 Labs  Recent Labs    04/20/20 0450 04/21/20 0736 04/22/20 0818  FERRITIN 315  319 271  LDH 331* 383* 326*  CRP 6.7* 5.5* 2.4*    Lab Results  Component Value Date   SARSCOV2NAA POSITIVE (A) 04/17/2020   [place] Not Detected 07/19/2019   Covid pneumonia/COPD -Not on home O2 -Hydroxyurea 500 mg BID -Solu-Medrol 40 mg BID -Remdesivir per pharmacy protocol -Combivent QID -Titrate O2 to maintain SPO2> 88% -Patient will be considered contagious until 9/21.  Patient  will need to take all general precautions such as masking, frequent handwashing, quarantining within house, etc.  Schedule follow-up appointment with Dr. Zetta Bills pulmonologist who evaluated patient in hospital.  Patient will require biopsy and per note IGRA/quantify for now as well as other etiology  including fungal.  Abnormal chest CT with nodules -Patient has spiculated nodules with adenopathy on chest CT which is concerning for malignancy However patient's son Dr [name] states he has history of TB in the past -9/3 discussed case with critical care and Dr [name].  Critical care agreed to see patient to determine when appropriate for biopsy and follow-up.   New onset atrial fibrillation with RVR -CHADS2VASC score=4 -Imdur 30 mg daily  -9/4 decrease Losartan 12.5 mg daily  -9/4 increase Metoprolol 37.5 mg BID -9/4 Digoxin 250 mcg x 1 -9/4 transition to Eliquis -9/4 patient had been rate controlled over the past 2 days however given patient's desaturation back in A. fib with RVR.  Most likely combination of desaturation, Covid pneumonia  Pulmonary hypertension -Patient has moderate pulmonary HTN by  echocardiogram see results below -See A. fib .    CAD -No chest pain, no evidence for ACS -See A. fib with RVR  HTN -See A-Fib  BPH -Continue Flomax  New onset Lower Extremity Weakness and Thigh Pain --9/4 T-spine MRI pending -9/4 L-spine MRI pending -9/4 pelvic MRI pending -Morphine 1 to 2 mg PRN  Metastatic neoplasm vs Potts disease  -Spoke at length with Dr [name] son of patient who states patient had active TB 20 years ago.  Although TB can spread into spine (Potts disease), asymptomatic presentation to have no symptoms and then have new onset symptoms. -Patient may also have primary lung neoplasm undiagnosed with mets. -Given patient's new onset lower extremity weakness and pain awaiting findings of above ordered MRI, will then discuss findings with patient's son and decide on treatment plan.     DVT prophylaxis: Lovenox---> apixaban Code Status: Full Family Communication: 9/4 spoke with sons Dr [name] and Mr. [name] concerning patient's new onset lower extremity weakness and pain and desaturation with episode of A. fib with RVR.  We will keep patient at least 1 more day for further work-up discussed plan answered all questions Status is: Inpatient    Dispo: The patient is from: Home              Anticipated d/c is to: Home              Anticipated d/c date is: 9/5              Patient currently unstable      Consultants:  Critical care  Procedures/Significant Events:  8/30 CT chest W0 contrast;Widespread multifocal areas of mixed interstitial, ground-glass and consolidative opacities with a peripheral and basilar predominance, with a peripheral and basilar predominance. Findings are favored to represent multifocal pneumonia in the setting of COVID-19 positivity. -Bilateral spiculated masslike opacities in both upper lobes as well as more nodular intermediate to soft tissue attenuation pleural thickening in the upper lungs including a region contiguous with more diffuse conglomerate in infiltrative soft tissue attenuation along the right paramediastinal margin concerning for underlying malignancy/metastatic disease. -Additional mediastinal nodes concerning for metastatic adenopathy. -Indeterminate nodule of the left adrenal gland measuring up to 1.6 cm in size, concerning for metastatic disease. -Somewhat indeterminate  sclerotic focus in the left sixth rib laterally. Additional mixed sclerotic and lucent lesion seen in the T11 spinous process as well. Could reflect osseous metastatic disease. -Indeterminate 1 cm mass in the right renal fossa. Possible cyst from the upper pole right kidney. - Ascending aortic dilatation to 4.1 cm. Recommend annual imaging followup by CTA or MRA.  8/31 echocardiogram;Left Ventricle: LVEF= 55 to 60%.  -Left ventricular diastolic parameters are indeterminate.  - moderately elevated pulmonary artery systolic pressure. estimated right ventricular systolic pressure is 00.3 mmHg.     I have personally reviewed and interpreted all radiology studies and my findings are as above.  VENTILATOR SETTINGS: Nasal cannula 9/4 Flow 2 L/min SPO2 94%   Cultures  Antimicrobials: Anti-infectives (From admission, onward)   Start     Ordered Stop   04/18/20 1000  remdesivir 100 mg in sodium chloride 0.9 % 100 mL IVPB       "Followed by" Linked Group Details   04/17/20 1443 04/22/20 0959   04/17/20 1600  remdesivir 200 mg in sodium chloride 0.9% 250 mL IVPB       "Followed by" Linked Group Details   04/17/20 1443 04/17/20 1659  04/17/20 1230  cefTRIAXone (ROCEPHIN) 2 g in sodium chloride 0.9 % 100 mL IVPB  Status:  Discontinued        04/17/20 1218 04/17/20 1725   04/17/20 1230  azithromycin (ZITHROMAX) 500 mg in sodium chloride 0.9 % 250 mL IVPB  Status:  Discontinued        04/17/20 1218 04/17/20 1725       Devices    LINES / TUBES:      Continuous Infusions: . sodium chloride       Objective: Vitals:   04/22/20 1200 04/22/20 1400 04/22/20 1445 04/22/20 1600  BP: (!) 104/58 113/72  (!) 119/56  Pulse: 99 95 (!) 102 91  Resp: 20 17  18   Temp: 97.8 F (36.6 C) 98.1 F (36.7 C)  97.9 F (36.6 C)  TempSrc: Oral Oral  Oral  SpO2: 96% 96%  97%  Weight:      Height:        Intake/Output Summary (Last 24 hours) at 04/22/2020 1733 Last data filed at 04/22/2020  1329 Gross per 24 hour  Intake --  Output 600 ml  Net -600 ml   Filed Weights   04/17/20 1017 04/17/20 1915  Weight: 51.7 kg 51.7 kg   Physical Exam:  General: A/O x4, positive acute respiratory distress Eyes: negative scleral hemorrhage, negative anisocoria, negative icterus ENT: Negative Runny nose, negative gingival bleeding, Neck:  Negative scars, masses, torticollis, lymphadenopathy, JVD Lungs: decreased breath sounds bilaterally without wheezes or crackles Cardiovascular: Irregularly irregular rhythm and rate without murmur gallop or rub normal S1 and S2 Abdomen: negative abdominal pain, nondistended, positive soft, bowel sounds, no rebound, no ascites, no appreciable mass Extremities: No significant cyanosis, clubbing, or edema bilateral lower extremities Skin: Negative rashes, lesions, ulcers Psychiatric:  Negative depression, negative anxiety, negative fatigue, negative mania  Central nervous system:  Cranial nerves II through XII intact, tongue/uvula midline, all extremities muscle strength 3/5, sensation intact throughout, pain to palpation midline T11-S1, negative dysarthria, negative expressive aphasia, negative receptive aphasia.  .     Data Reviewed: Care during the described time interval was provided by me .  I have reviewed this patient's available data, including medical history, events of note, physical examination, and all test results as part of my evaluation.  CBC: Recent Labs  Lab 04/18/20 0556 04/19/20 0456 04/20/20 0450 04/21/20 0736 04/22/20 0818  WBC 9.7 16.9* 17.0* 17.4* 29.2*  NEUTROABS 8.2* 14.5* 14.0* 14.4* 22.5*  HGB 12.2* 12.5* 12.5* 14.1 10.6*  HCT 35.4* 37.1* 36.7* 40.0 31.0*  MCV 93.9 95.4 93.9 91.5 93.9  PLT 420* 574* 605* 703* 742*   Basic Metabolic Panel: Recent Labs  Lab 04/17/20 1006 04/17/20 1006 04/18/20 0556 04/19/20 0456 04/19/20 1018 04/20/20 0450 04/21/20 0736 04/22/20 0818  NA 131*   < > 132* 130*  --  131* 131*  130*  K 4.9   < > 5.1 4.8  --  4.4 4.3 4.1  CL 99   < > 103 97*  --  100 98 98  CO2 25   < > 22 24  --  25 23 25   GLUCOSE 112*   < > 130* 110*  --  106* 130* 154*  BUN 17   < > 19 31*  --  30* 25* 34*  CREATININE 0.91   < > 0.95 1.06  --  0.80 0.83 0.86  CALCIUM 8.3*   < > 8.3* 8.5*  --  8.3* 8.6* 7.9*  MG 2.0  --   --   --  2.0 1.9 2.0 2.1  PHOS  --   --   --   --  3.5 3.4 3.2 3.9   < > = values in this interval not displayed.   GFR: Estimated Creatinine Clearance: 44.3 mL/min (by C-G formula based on SCr of 0.86 mg/dL). Liver Function Tests: Recent Labs  Lab 04/18/20 0556 04/19/20 0456 04/20/20 0450 04/21/20 0736 04/22/20 0818  AST 33 38 33 30 26  ALT 22 23 24 25 22   ALKPHOS 80 83 75 84 65  BILITOT 1.4* 1.5* 1.3* 1.4* 1.2  PROT 6.3* 6.5 5.8* 6.3* 5.1*  ALBUMIN 2.8* 2.9* 2.6* 2.9* 2.6*   No results for input(s): LIPASE, AMYLASE in the last 168 hours. No results for input(s): AMMONIA in the last 168 hours. Coagulation Profile: Recent Labs  Lab 04/17/20 1908  INR 1.2   Cardiac Enzymes: No results for input(s): CKTOTAL, CKMB, CKMBINDEX, TROPONINI in the last 168 hours. BNP (last 3 results) No results for input(s): PROBNP in the last 8760 hours. HbA1C: No results for input(s): HGBA1C in the last 72 hours. CBG: No results for input(s): GLUCAP in the last 168 hours. Lipid Profile: No results for input(s): CHOL, HDL, LDLCALC, TRIG, CHOLHDL, LDLDIRECT in the last 72 hours. Thyroid Function Tests: No results for input(s): TSH, T4TOTAL, FREET4, T3FREE, THYROIDAB in the last 72 hours. Anemia Panel: Recent Labs    04/21/20 0736 04/22/20 0818  FERRITIN 319 271   Sepsis Labs: Recent Labs  Lab 04/17/20 1305 04/17/20 1459  PROCALCITON  --  <0.10  LATICACIDVEN 1.5  --     Recent Results (from the past 240 hour(s))  SARS Coronavirus 2 by RT PCR (hospital order, performed in [facility] hospital lab) Nasopharyngeal Nasopharyngeal Swab     Status: Abnormal   Collection  Time: 04/17/20 12:02 PM   Specimen: Nasopharyngeal Swab  Result Value Ref Range Status   SARS Coronavirus 2 POSITIVE (A) NEGATIVE Final    Comment: RESULT CALLED TO, READ BACK BY AND VERIFIED WITH: GREG MOYER,RN 1412 04/17/2020 DB (NOTE) SARS-CoV-2 target nucleic acids are DETECTED  SARS-CoV-2 RNA is generally detectable in upper respiratory specimens  during the acute phase of infection.  Positive results are indicative  of the presence of the identified virus, but do not rule out bacterial infection or co-infection with other pathogens not detected by the test.  Clinical correlation with patient history and  other diagnostic information is necessary to determine patient infection status.  The expected result is negative.  Fact Sheet for Patients:   StrictlyIdeas.no   Fact Sheet for Healthcare Providers:   BankingDealers.co.za    This test is not yet approved or cleared by the [place] FDA and  has been authorized for detection and/or diagnosis of SARS-CoV-2 by FDA under an Emergency Use Authorization (EUA).  This EUA will remain in effect (meaning this test  can be used) for the duration of  the COVID-19 declaration under Section 564(b)(1) of the Act, 21 U.S.C. section 360-bbb-3(b)(1), unless the authorization is terminated or revoked sooner.  Performed at [facility], [address] [place], [place] [address]   Blood Culture (routine x 2)     Status: None   Collection Time: 04/17/20 12:02 PM   Specimen: BLOOD  Result Value Ref Range Status   Specimen Description BLOOD LEFT ARM  Final   Special Requests   Final    BOTTLES DRAWN AEROBIC AND ANAEROBIC Blood Culture adequate volume   Culture   Final  NO GROWTH 5 DAYS Performed at [facility], [address] [place], [place] [address]    Report Status 04/22/2020 FINAL  Final  Urine culture     Status: None   Collection Time: 04/17/20  1:05 PM    Specimen: In/Out Cath Urine  Result Value Ref Range Status   Specimen Description   Final    IN/OUT CATH URINE Performed at [facility], [address] [place], [place] [address]    Special Requests   Final    NONE Performed at [facility], [address], [place], [place] [address]    Culture   Final    NO GROWTH Performed at [facility], [address] [place], [place] [address]    Report Status 04/18/2020 FINAL  Final  Blood Culture (routine x 2)     Status: None   Collection Time: 04/17/20  7:08 PM   Specimen: BLOOD  Result Value Ref Range Status   Specimen Description BLOOD RIGHT ANTECUBITAL  Final   Special Requests   Final    BOTTLES DRAWN AEROBIC AND ANAEROBIC Blood Culture adequate volume   Culture   Final    NO GROWTH 5 DAYS Performed at [facility], [address] [place], [place] [address]    Report Status 04/22/2020 FINAL  Final         Radiology Studies: [facility] Chest Port 1 View  Result Date: 04/22/2020 CLINICAL DATA:  Unresponsive this morning.  COVID positive EXAM: PORTABLE CHEST 1 VIEW COMPARISON:  Five days ago FINDINGS: Patchy bilateral pneumonia with similar distribution but likely decreasing density. Normal heart size. 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 No visible effusion or air leak. Hyperinflation in the setting of COPD. Calcification and pleural thickening asymmetric at the right apex. IMPRESSION: Bilateral pneumonia with mildly improved aeration. Reference recent chest CT concerning the findings of possible intrathoracic malignancy. Electronically Signed   By: Monte Fantasia M.D.   On: 04/22/2020 09:09        Scheduled Meds: . apixaban  2.5 mg Oral BID  . aspirin EC  81 mg Oral Daily  . atorvastatin  20 mg Oral Daily  . hydroxyurea  500 mg Oral BID  . Ipratropium-Albuterol  1 puff Inhalation Q6H  . [START ON 04/23/2020] isosorbide mononitrate  30 mg Oral Daily  . [START ON 04/23/2020] losartan  12.5 mg Oral Daily  .  methylPREDNISolone (SOLU-MEDROL) injection  40 mg Intravenous Q12H  . metoprolol tartrate  37.5 mg Oral BID  . sodium chloride flush  3 mL Intravenous Q12H  . tamsulosin  0.4 mg Oral QPC supper   Continuous Infusions: . sodium chloride       LOS: 5 days    Time spent:40 min    Clinton Gordon, Clinton Docker, MD [facility] Pager [phone]  If 7PM-7AM, please contact night-coverage [email or web address] Password [email or web address] 04/22/2020, 5:33 PM

## 2020-04-22 NOTE — Progress Notes (Addendum)
During shift change report pt requested to transfer to chair.  Pt slept on bed matterss on the floor.  This Probation officer and night RN assisted pt to the chair.  Within seconds pt became unresponsive.  At that time noted HR on the monitor 135 A.fib and HR 59% on 2L per [place].  Night RN called code blue, but then cancelled due to pt becoming responsive. Once pt placed in the bed, within about couple of minutes, pt answered appropriately and VSS.  Dr. Sherral Hammers notified of the above.  Awaiting labs. MD will place orders.

## 2020-04-22 NOTE — Progress Notes (Signed)
RN called patient's son [name] and updated on the concern of patient deciding to lay on the floor. Per [name], patient lays on the floor at home sometimes. [name] stated, he did not fall. I am sure he laid himself on the floor because that is what he dose.

## 2020-04-22 NOTE — Consult Note (Signed)
[place] for Apixaban Indication: atrial fibrillation  No Known Allergies  Patient Measurements: Height: 5\' 2"  (157.5 cm) Weight: 51.7 kg (113 lb 15.7 oz) IBW/kg (Calculated) : 54.6  Vital Signs: Temp: 97.8 F (36.6 C) (09/04 1200) Temp Source: Oral (09/04 1200) BP: 104/58 (09/04 1200) Pulse Rate: 99 (09/04 1200)  Labs: Recent Labs    04/20/20 0450 04/20/20 0450 04/21/20 0736 04/22/20 0818  HGB 12.5*   < > 14.1 10.6*  HCT 36.7*  --  40.0 31.0*  PLT 605*  --  703* 887*  CREATININE 0.80  --  0.83 0.86   < > = values in this interval not displayed.    Estimated Creatinine Clearance: 44.3 mL/min (by C-G formula based on SCr of 0.86 mg/dL).   Medications:  No anticoagulation prior to admission per chart review  Assessment: Patient is an 86 y/o M with medical history including essential thrombocythemia who is admitted with COVID-19 pneumonia. Patient noted to be in atrial fibrillation. Patient is being transitioned from therapeutic Lovenox to Apixaban for atrial fibrillation. Pharmacy has been consulted for Apixaban dosing.   Hgb trending down; Plt remains elevated at 887 given history of thrombocythemia.   Plan:  Patient is >= 46 years old and <=60kg, will start Apixaban 2.5mg  BID --CBC per protocol   Sherilyn Banker, PharmD Pharmacy Resident  04/22/2020 2:32 PM

## 2020-04-22 NOTE — Discharge Summary (Addendum)
Physician Discharge Summary  CHALLEN SPAINHOUR [id number] DOB: [id number] DOA: 84/30/2021  PCP: Tracie Harrier, MD  Admit date: 04/17/2020 Discharge date: 9/84/2021  Time spent: 35 minutes  Recommendations for Outpatient Follow-up:   Covid vaccination; vaccinated   Principal Problem:   Pneumonia due to COVID-19 virus Active Problems:   Essential thrombocythemia ([place])   Hypertension   BPH without obstruction/lower urinary tract symptoms   Pulmonary hypertension ([place])   Lower extremity weakness   Thigh pain  Acute respiratory failure with hypoxia/COPD COVID-19 Labs  Recent Labs    04/21/20 0736 04/22/20 0818 04/23/20 0542  FERRITIN 319 271 262  LDH 383* 326* 315*  CRP 5.5* 2.4* 1.3*    Lab Results  Component Value Date   SARSCOV2NAA POSITIVE (A) 04/17/2020   Keith Not Detected 07/19/2019    Covidpneumonia/COPD -Not on home O2 -Hydroxyurea 500 mg BID -Solu-Medrol 40 mg BID -Remdesivir per pharmacy protocol -Combivent QID -Titrate O2 to maintain SPO2> 88% -Patient will be considered contagious until 9/21.  Patient  will need to take all general precautions; see below  -Schedule follow-up appointment with Dr. Zetta Bills pulmonologist who evaluated patient in hospital.  Patient will require biopsy and per note IGRA/quantify for now as well as other etiology  including fungal. -SATURATION QUALIFICATIONS: (This note is used to comply with regulatory documentation for home oxygen) Patient Saturations on Room Air at Rest = 92% Patient Saturations on Room Air while Ambulating = Please see the note below. Patient Saturations on 2 Liters of oxygen while Ambulating = See note below Please briefly explain why patient needs home oxygen: Pt's O2 sats at rest 93%.  O2 sats dropped to 81% sitting in bed.  HR increased to 116.  Pt is weak and stayed in bed during assessment.  2L O2 reapplied, O2 sats up to mid 90's.  -Place patient on 2 L O2 titrate to maintain  SPO2> 93% -Provide Inogen portable home O2 oxygen generator  Abnormal chest CT with nodules -Patient has spiculated nodules with adenopathy on chest CT which is concerning for malignancy However patient's sonDr Neila Gear he has history of TB in the past -9/3 discussed case with critical care and Dr [name].  Critical care agreed to see patient to determine when appropriate for biopsy and follow-up.   New onset atrial fibrillation with RVR -CHADS2VASC score=4 -Imdur 30 mg daily (held) secondary to hypotension and acute hematoma -9/5 Losartan 12.5 mg daily(hold) secondary to hypotension and acute hematoma -9/4 increase Metoprolol 37.5 mg BID -9/4 Digoxin 250 mcg x 1 -9/1 Digoxin 62.5 mcg daily -9/5 Apixaban (held) secondary to acute hematoma -9/4 patient had been rate controlled over the past 2 days however given patient's desaturation back in A. fib with RVR.  Most likely combination of desaturation, Covid pneumonia -9/5 Albumin 25 g secondary hypotension; after completion of albumin bolus 522ml normal saline -9/5 after above treatment patient's A. fib better controlled but not considered adequately controlled this was explained to Dr [name] son and Mr. Jaquise Faux  son/POA both are willing to accept risk associated with not fully controlled A. fib with RVR.    Pulmonary hypertension -Patient has moderate pulmonary HTN by echocardiogram see results below -See A. fib .    CAD -No chest pain, no evidence for ACS -See A. fib with RVR  HTN/Hypotension -See A-Fib  BPH -Continue Flomax  New onset Lower Extremity Weakness and Thigh Pain --9/4 T-spine MRI pending -9/4 L-spine MRI pending -9/4 pelvic MRI pending -9/5  CT abdomen pelvis W contrast per radiology recommendation; shows active bleeding results below.  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  Although TB can  spread into spine (Potts disease), asymptomatic presentation to have no symptoms and then have new onset symptoms. -Patient may also have primary lung neoplasm undiagnosed with mets. -Given patient's new onset lower extremity weakness and pain awaiting findings of above ordered MRI, will then discuss findings with patient's son and decide on treatment plan.  Acute bilateral psoas and iliacus muscles hematoma -9/5 hold apixaban, ASA and any other blood thinning products. -9/5 CT abdomen pelvis identified Active hemorrhage/contrast extravasation is identified within the RIGHT iliopsoas hematoma.  See results below  Goals of care -9/5 9/4 spoke with sons Dr [name] and Mr. Jesselee Poth  concerning patient's current hematomas 2 of which appear to be actively bleeding, and which radiology has recommended close follow-up.  Due to family circumstances  Dr [name] and Mr. Isiaha Greenup son/POA they have decided to accept the risks associated with patient being discharged from hospital.  Given that family is in the  healthcare field feel that they have extremely good grasp of the repercussions of early discharge and have weighed their decision carefully.  In addition patient has expressed his wish to be discharged regardless of.   9/6 ADDENDUM; Palliative Care Consult:Family had insisted on taking patient home yesterday when it informed that patient was still actively bleeding, and that his A. fib with RVR was not fully controlled.  1 son is a physician, 1 son is POA after long discussion they stated that secondary to family dynamics they were willing to take the risk, understood the risk.  However when they arrived to take patient home he had decompensated, changed her mind wanted everything done for patient refused to take him home.  Patient needs to be made DNR.  Discussion on future goals of care.  Per nighttime physician note ICU has agreed to allow family to come today in person to discuss plan of care.   Would be most helpful if palliative care was at this meeting.     Discharge Diagnoses:  Principal Problem:   Pneumonia due to COVID-19 virus Active Problems:   Essential thrombocythemia ([place])   Hypertension   BPH without obstruction/lower urinary tract symptoms   Pulmonary hypertension ([facility])   Lower extremity weakness   Thigh pain   Iliopsoas muscle hematoma   Discharge Condition: Guarded  Diet recommendation: Heart healthy  Filed Weights   04/17/20 1017 04/17/20 1915  Weight: 51.7 kg 51.7 kg    History of present illness:  Shahzain C Patelis an 86 y.o.malewith PMHx significant for COPD, HTN, CAD, BPH, essential thrombocytosis and remote history of TB who was in his usual state of health until 4 days ago when he developed malaise and cough. Patient states 2 days ago he became short of breath and was having difficulty breathing even at rest. Patient denies any nausea vomiting or diarrhea. Denies abdominal pain. Denies chest pain. His main concern is difficulty catching his breath. Patient admits to cough, unclear whether it is productive. Denies fevers or chills. Patient states he has been vaccinated x2 for Covid.  Hospital Course:  See above  Procedures: 8/30 CT chest W0 contrast;Widespread multifocal areas of mixed interstitial, ground-glass and consolidative opacities with a peripheral and basilar predominance, with a peripheral and basilar predominance. Findings are favored  to represent multifocal pneumonia in the setting of COVID-19 positivity. -Bilateral spiculated masslike opacities in both upper lobes as well as more nodular intermediate to soft tissue attenuation pleural thickening in the upper lungs including a region contiguous with more diffuse conglomerate in infiltrative soft tissue attenuation along the right paramediastinal margin concerning for underlying malignancy/metastatic disease. -Additional mediastinal nodes concerning for metastatic  adenopathy. -Indeterminate nodule of the left adrenal gland measuring up to 1.6 cm in size, concerning for metastatic disease. -Somewhat indeterminate sclerotic focus in the left sixth rib laterally. Additional mixed sclerotic and lucent lesion seen in the T11 spinous process as well. Could reflect osseous metastatic disease. -Indeterminate 1 cm mass in the right renal fossa. Possible cyst from the upper pole right kidney. - Ascending aortic dilatation to 4.1 cm. Recommend annual imaging followup by CTA or MRA.  8/31 echocardiogram;Left Ventricle: LVEF= 55 to 60%.  -Left ventricular diastolic parameters are indeterminate.  - moderately elevated pulmonary artery systolic pressure. estimated right ventricular systolic pressure is 63.0 mmHg.    9/4 MRI T-spine/L-spine/pelvis;-no evidence of metastatic disease to the thoracic or lumbar spine. -. Multiple fluid collections within the psoas and iliacus muscles, RIGHT>>> LEFT consistent with intramuscular hematomas. Dedicated imaging of this region is recommended. -. Pleural effusions. -Mild multilevel lumbar degenerative disc disease with mild spinal canal stenosis at L3-4.   9/5 CT abdomen pelvis W contrast;Hypodense collection within the splenic hilum, measuring 4.9 x 4cm, compatible with acute hematoma. Questionable focal active hemorrhage/extravasation at the upper margin of this perisplenic collection, but not convincing. Recommend short-term follow-up CT to ensure stability. -Large intramuscular hematomas involving the bilateral iliopsoas musculature, as described on today's earlier MRI exams and not significantly changed in the short-term interval. -Active hemorrhage/contrast extravasation is identified within the RIGHT iliopsoas hematoma Recommend short-term follow-up CT to ensure resolution. -Bibasilar pleural effusions. Mixed consolidations and ground-glass opacities at the bilateral lung bases, corresponding to the chest CT findings of  04/17/2020 suggesting multifocal pneumonia. -. Cholelithiasis without evidence of acute cholecystitis. -Anasarca.  Consultations: Critical care  Ventilator settings Nasal cannula 9/5 Flow 3 L/min SPO2 99%   Cultures   8/30 SARS coronavirus positive  9/3 QuantiFERON pending 9/3 histoplasmosis [place] pending   Antibiotics Anti-infectives (From admission, onward)   Start     Ordered Stop   04/18/20 1000  remdesivir 100 mg in sodium chloride 0.9 % 100 mL IVPB       "Followed by" Linked Group Details   04/17/20 1443 04/21/20 1100   04/17/20 1600  remdesivir 200 mg in sodium chloride 0.9% 250 mL IVPB       "Followed by" Linked Group Details   04/17/20 1443 04/17/20 1659   04/17/20 1230  cefTRIAXone (ROCEPHIN) 2 g in sodium chloride 0.9 % 100 mL IVPB  Status:  Discontinued        04/17/20 1218 04/17/20 1725   04/17/20 1230  azithromycin (ZITHROMAX) 500 mg in sodium chloride 0.9 % 250 mL IVPB  Status:  Discontinued        04/17/20 1218 04/17/20 1725       Discharge Exam: Vitals:   04/23/20 1310 04/23/20 1400 04/23/20 1500 04/23/20 1556  BP:  (!) 112/37 (!) 92/41 113/60  Pulse: 95 92 87 (!) 111  Resp:  (!) 22 17 (!) 25  Temp:  97.9 F (36.6 C) 98.1 F (36.7 C) 97.8 F (36.6 C)  TempSrc:  Oral Oral Oral  SpO2:  100% 100% 100%  Weight:      Height:  General: A/O x4, positive acute respiratory distress Eyes: negative scleral hemorrhage, negative anisocoria, negative icterus ENT: Negative Runny nose, negative gingival bleeding, Neck:  Negative scars, masses, torticollis, lymphadenopathy, JVD Lungs: decreased breath sounds bilaterally without wheezes or crackles Cardiovascular: Irregularly irregular rhythm and rate without murmur gallop or rub normal S1 and S2   Discharge Instructions  Discharge Instructions    Diet - low sodium heart healthy   Complete by: As directed    Discharge instructions   Complete by: As directed    ?   Person Under Monitoring  Name: ANGELOS WASCO  Location: [place] [address]   Infection Prevention Recommendations for Individuals Confirmed to have, or Being Evaluated for, 2019 Novel Coronavirus (COVID-19) Infection Who Receive Care at Home  Individuals who are confirmed to have, or are being evaluated for, COVID-19 should follow the prevention steps below until a healthcare provider or local or state health department says they can return to normal activities.  Stay home except to get medical care You should restrict activities outside your home, except for getting medical care. Do not go to work, school, or public areas, and do not use public transportation or taxis.  Call ahead before visiting your doctor Before your medical appointment, call the healthcare provider and tell them that you have, or are being evaluated for, COVID-19 infection. This will help the healthcare provider's office take steps to keep other people from getting infected. Ask your healthcare provider to call the local or state health department.  Monitor your symptoms Seek prompt medical attention if your illness is worsening (e.g., difficulty breathing). Before going to your medical appointment, call the healthcare provider and tell them that you have, or are being evaluated for, COVID-19 infection. Ask your healthcare provider to call the local or state health department.  Wear a facemask You should wear a facemask that covers your nose and mouth when you are in the same room with other people and when you visit a healthcare provider. People who live with or visit you should also wear a facemask while they are in the same room with you.  Separate yourself from other people in your home As much as possible, you should stay in a different room from other people in your home. Also, you should use a separate bathroom, if available.  Avoid sharing household items You should not share dishes, drinking  glasses, cups, eating utensils, towels, bedding, or other items with other people in your home. After using these items, you should wash them thoroughly with soap and water.  Cover your coughs and sneezes Cover your mouth and nose with a tissue when you cough or sneeze, or you can cough or sneeze into your sleeve. Throw used tissues in a lined trash can, and immediately wash your hands with soap and water for at least 20 seconds or use an alcohol-based hand rub.  Wash your [facility] your hands often and thoroughly with soap and water for at least 20 seconds. You can use an alcohol-based hand sanitizer if soap and water are not available and if your hands are not visibly dirty. Avoid touching your eyes, nose, and mouth with unwashed hands.   Prevention Steps for Caregivers and Household Members of Individuals Confirmed to have, or Being Evaluated for, COVID-19 Infection Being Cared for in the Home  If you live with, or provide care at home for, a person confirmed to have, or being evaluated for, COVID-19 infection please follow these guidelines  to prevent infection:  Follow healthcare provider's instructions Make sure that you understand and can help the patient follow any healthcare provider instructions for all care.  Provide for the patient's basic needs You should help the patient with basic needs in the home and provide support for getting groceries, prescriptions, and other personal needs.  Monitor the patient's symptoms If they are getting sicker, call his or her medical provider and tell them that the patient has, or is being evaluated for, COVID-19 infection. This will help the healthcare provider's office take steps to keep other people from getting infected. Ask the healthcare provider to call the local or state health department.  Limit the number of people who have contact with the patient If possible, have only one caregiver for the patient. Other household members  should stay in another home or place of residence. If this is not possible, they should stay in another room, or be separated from the patient as much as possible. Use a separate bathroom, if available. Restrict visitors who do not have an essential need to be in the home.  Keep older adults, very young children, and other sick people away from the patient Keep older adults, very young children, and those who have compromised immune systems or chronic health conditions away from the patient. This includes people with chronic heart, lung, or kidney conditions, diabetes, and cancer.  Ensure good ventilation Make sure that shared spaces in the home have good air flow, such as from an air conditioner or an opened window, weather permitting.  Wash your hands often Wash your hands often and thoroughly with soap and water for at least 20 seconds. You can use an alcohol based hand sanitizer if soap and water are not available and if your hands are not visibly dirty. Avoid touching your eyes, nose, and mouth with unwashed hands. Use disposable paper towels to dry your hands. If not available, use dedicated cloth towels and replace them when they become wet.  Wear a facemask and gloves Wear a disposable facemask at all times in the room and gloves when you touch or have contact with the patient's blood, body fluids, and/or secretions or excretions, such as sweat, saliva, sputum, nasal mucus, vomit, urine, or feces.  Ensure the mask fits over your nose and mouth tightly, and do not touch it during use. Throw out disposable facemasks and gloves after using them. Do not reuse. Wash your hands immediately after removing your facemask and gloves. If your personal clothing becomes contaminated, carefully remove clothing and launder. Wash your hands after handling contaminated clothing. Place all used disposable facemasks, gloves, and other waste in a lined container before disposing them with other household  waste. Remove gloves and wash your hands immediately after handling these items.  Do not share dishes, glasses, or other household items with the patient Avoid sharing household items. You should not share dishes, drinking glasses, cups, eating utensils, towels, bedding, or other items with a patient who is confirmed to have, or being evaluated for, COVID-19 infection. After the person uses these items, you should wash them thoroughly with soap and water.  Wash laundry thoroughly Immediately remove and wash clothes or bedding that have blood, body fluids, and/or secretions or excretions, such as sweat, saliva, sputum, nasal mucus, vomit, urine, or feces, on them. Wear gloves when handling laundry from the patient. Read and follow directions on labels of laundry or clothing items and detergent. In general, wash and dry with the warmest temperatures recommended  on the label.  Clean all areas the individual has used often Clean all touchable surfaces, such as counters, tabletops, doorknobs, bathroom fixtures, toilets, phones, keyboards, tablets, and bedside tables, every day. Also, clean any surfaces that may have blood, body fluids, and/or secretions or excretions on them. Wear gloves when cleaning surfaces the patient has come in contact with. Use a diluted bleach solution (e.g., dilute bleach with 1 part bleach and 10 parts water) or a household disinfectant with a label that says EPA-registered for coronaviruses. To make a bleach solution at home, add 1 tablespoon of bleach to 1 quart (4 cups) of water. For a larger supply, add  cup of bleach to 1 gallon (16 cups) of water. Read labels of cleaning products and follow recommendations provided on product labels. Labels contain instructions for safe and effective use of the cleaning product including precautions you should take when applying the product, such as wearing gloves or eye protection and making sure you have good ventilation during use of  the product. Remove gloves and wash hands immediately after cleaning.  Monitor yourself for signs and symptoms of illness Caregivers and household members are considered close contacts, should monitor their health, and will be asked to limit movement outside of the home to the extent possible. Follow the monitoring steps for close contacts listed on the symptom monitoring form.   ? If you have additional questions, contact your local health department or call the epidemiologist on call at [phone] (available 24/7). ? This guidance is subject to change. For the most up-to-date guidance from [facility], please refer to their website: YouBlogs.pl   For home use only DME oxygen   Complete by: As directed    SATURATION QUALIFICATIONS: (This note is used to comply with regulatory documentation for home oxygen) Patient Saturations on Room Air at Rest = 92% Patient Saturations on Room Air while Ambulating = Please see the note below. Patient Saturations on 2 Liters of oxygen while Ambulating = See note below Please briefly explain why patient needs home oxygen: Pt's O2 sats at rest 93%.  O2 sats dropped to 81% sitting in bed.  HR increased to 116.  Pt is weak and stayed in bed during assessment.  2L O2 reapplied, O2 sats up to mid 90's.  -Place patient on 2 L O2 titrate to maintain SPO2> 93% -Provide Inogen portable home O2 oxygen generator   Length of Need: Lifetime   Mode or (Route): Nasal cannula   Liters per Minute: 2   Frequency: Continuous (stationary and portable oxygen unit needed)   Oxygen conserving device: Yes   Oxygen delivery system: Gas   Increase activity slowly   Complete by: As directed      Allergies as of 04/23/2020   No Known Allergies     Medication List    STOP taking these medications   aspirin 81 MG EC tablet   atorvastatin 20 MG tablet Commonly known as: LIPITOR   isosorbide mononitrate 30 MG 24 hr  tablet Commonly known as: IMDUR   losartan 25 MG tablet Commonly known as: COZAAR   metoprolol succinate 25 MG 24 hr tablet Commonly known as: TOPROL-XL     TAKE these medications   albuterol 108 (90 Base) MCG/ACT inhaler Commonly known as: VENTOLIN HFA Inhale 1-2 puffs into the lungs every 4 (four) hours as needed for wheezing or shortness of breath.   Digoxin 62.5 MCG Tabs Take 0.0625 mg by mouth daily. Start taking on: April 24, 2020  guaiFENesin-dextromethorphan 100-10 MG/5ML syrup Commonly known as: ROBITUSSIN DM Take 10 mLs by mouth every 4 (four) hours as needed for cough.   hydroxyurea 500 MG capsule Commonly known as: Hydrea Take 1 capsule (500 mg total) by mouth 2 (two) times daily. May take with food to minimize GI side effects.   Ipratropium-Albuterol 20-100 MCG/ACT Aers respimat Commonly known as: COMBIVENT Inhale 1 puff into the lungs every 6 (six) hours.   Metoprolol Tartrate 37.5 MG Tabs Take 12.5 mg by mouth 2 (two) times daily.   ondansetron 4 MG tablet Commonly known as: ZOFRAN Take 1 tablet (4 mg total) by mouth every 6 (six) hours as needed for nausea.   pantoprazole 40 MG tablet Commonly known as: PROTONIX Take 1 tablet (40 mg total) by mouth 2 (two) times daily.   tamsulosin 0.4 MG Caps capsule Commonly known as: FLOMAX Take 0.4 mg by mouth daily after supper.            Durable Medical Equipment  (From admission, onward)         Start     Ordered   04/23/20 0000  For home use only DME oxygen       Comments: SATURATION QUALIFICATIONS: (This note is used to comply with regulatory documentation for home oxygen) Patient Saturations on Room Air at Rest = 92% Patient Saturations on Room Air while Ambulating = Please see the note below. Patient Saturations on 2 Liters of oxygen while Ambulating = See note below Please briefly explain why patient needs home oxygen: Pt's O2 sats at rest 93%.  O2 sats dropped to 81% sitting in bed.  HR  increased to 116.  Pt is weak and stayed in bed during assessment.  2L O2 reapplied, O2 sats up to mid 90's.  -Place patient on 2 L O2 titrate to maintain SPO2> 93% -Provide Inogen portable home O2 oxygen generator  Question Answer Comment  Length of Need Lifetime   Mode or (Route) Nasal cannula   Liters per Minute 2   Frequency Continuous (stationary and portable oxygen unit needed)   Oxygen conserving device Yes   Oxygen delivery system Gas      04/23/20 1700         No Known Allergies  Follow-up Information    Ottie Glazier, MD. Schedule an appointment as soon as possible for a visit in 3 week(s).   Specialty: Pulmonary Disease Why: Schedule follow-up appointment with Dr. Zetta Bills pulmonologist who evaluated patient in hospital.  Patient will require biopsy and per note IGRA/quantify for now as well as other etiology including fungal.  Contact information: [place]  [id number] [phone]                The results of significant diagnostics from this hospitalization (including imaging, microbiology, ancillary and laboratory) are listed below for reference.    Significant Diagnostic Studies: DG Chest 2 View  Result Date: 04/17/2020 CLINICAL DATA:  Chest pain, COVID positive EXAM: CHEST - 2 VIEW COMPARISON:  None FINDINGS: Multifocal opacities bilaterally. Possible loculated right pleural effusion along the lateral chest wall extending to the apex. Right paratracheal prominence. No pneumothorax. Heart size is within normal limits. Evidence of prior CABG. No acute osseous abnormality. IMPRESSION: Multifocal pulmonary opacities suspicious for pneumonia. Possible loculated right pleural effusion extending to the apex. Electronically Signed   By: Macy Mis M.D.   On: 04/17/2020 10:49   CT Chest Wo Contrast  Result Date: 04/17/2020 CLINICAL DATA:  COVID-19  positive last week, presents with worsening shortness of breath, possible effusion  EXAM: CT CHEST WITHOUT CONTRAST TECHNIQUE: Multidetector CT imaging of the chest was performed following the standard protocol without IV contrast. COMPARISON:  Radiograph 04/17/2020 FINDINGS: Cardiovascular: Borderline cardiomegaly. Three-vessel coronary artery disease. Calcifications of the aortic leaflets. Atherosclerotic plaque within the thoracic aorta. Ascending thoracic aortic dilatation to 4.1 cm central pulmonary arteries are upper limits normal caliber. Luminal evaluation of vasculature precluded in the absence of contrast media. Mediastinum/Nodes: Multiple borderline enlarged mediastinal and hilar nodes with additional more conglomerate, infiltrative soft tissue attenuation in the right hilum extending to the paramediastinal border, margins of which are difficult to fully ascertain in the absence of contrast media no. Several larger index nodes in the mediastinum include a 10 mm prevascular lymph node (2/40), 10 mm AP window lymph node (2/44), and a 11 mm precarinal lymph node (2/44). Resulting in narrowing and segmental occlusion of several of the mid to lower lung airways on the right. Additional flattening of the lower trachea and airways, could reflect some degree of tracheal malacia. Secretions noted in the lower airways as well. Thoracic esophagus with a small hiatal hernia. No other acute abnormality. Thyroid gland with few punctate calcifications. No discrete nodules. Lungs/Pleura: Widespread multifocal areas of mixed interstitial, ground-glass and consolidative opacities with a peripheral and basilar predominance. There are additional areas of more spiculated masslike opacity present in the left upper lobe (3/31) measuring up to 2.4 x 2.3 cm in size. A separate spiculated masslike nodule seen in the left apex as well measuring 1.6 x 1.6 cm in size (3/19). Furthermore, the previously suspected effusion in the right lung demonstrates some more intermediate attenuation with lobular margins  superiorly possibly reflecting a pleural based lesion given contiguity with the mediastinal and hilar soft tissue attenuation posteriorly (2/25). Additional nodular pleural thickening is seen in the left lung as well (2/45). Small fluid attenuation of fusions are noted more inferiorly bilaterally. Upper Abdomen: Indeterminate possible cyst arising in the upper right renal fossa (2/147). Indeterminate nodule of the left adrenal gland measuring up to 1.6 cm (2/138). Musculoskeletal: Postsurgical changes from prior sternotomy with bony fusion across the sternal plate manubrium. Intact sternal sutures. Multilevel degenerative changes in the spine. Somewhat indeterminate sclerotic focus present in the left sixth rib laterally (5/52). Additional mixed sclerotic and lucent lesion seen T11 spinous process as well (6/81). IMPRESSION: 1. Widespread multifocal areas of mixed interstitial, ground-glass and consolidative opacities with a peripheral and basilar predominance, with a peripheral and basilar predominance. Findings are favored to represent multifocal pneumonia in the setting of COVID-19 positivity. 2. Bilateral spiculated masslike opacities in both upper lobes as well as more nodular intermediate to soft tissue attenuation pleural thickening in the upper lungs including a region contiguous with more diffuse conglomerate in infiltrative soft tissue attenuation along the right paramediastinal margin concerning for underlying malignancy/metastatic disease. 3. Additional mediastinal nodes concerning for metastatic adenopathy. 4. Indeterminate nodule of the left adrenal gland measuring up to 1.6 cm in size, concerning for metastatic disease. 5. Somewhat indeterminate sclerotic focus in the left sixth rib laterally. Additional mixed sclerotic and lucent lesion seen in the T11 spinous process as well. Could reflect osseous metastatic disease. 6. Indeterminate 1 cm mass in the right renal fossa. Possible cyst from the upper  pole right kidney. 7. Aortic Atherosclerosis (ICD10-I70.0). 8. Ascending aortic dilatation to 4.1 cm. Recommend annual imaging followup by CTA or MRA. This recommendation follows 2010 ACCF/AHA/AATS/ACR/ASA/SCA/SCAI/SIR/STS/SVM Guidelines for the Diagnosis and Management  of Patients with Thoracic Aortic Disease. Circulation. 2010; 121: K440-N027. Aortic aneurysm NOS (ICD10-I71.9) 9. Coronary artery calcifications are present. Please note that the presence of coronary artery calcium documents the presence of coronary artery disease, the severity of this disease and any potential stenosis cannot be assessed on this non-gated CT examination. Assessment for potential risk factor modification, dietary therapy or pharmacologic therapy may be warranted. These results were called by telephone at the time of interpretation on 04/17/2020 at 4:23 pm to provider Dr. Cherylann Banas, who verbally acknowledged these results. Electronically Signed   By: Lovena Le M.D.   On: 04/17/2020 16:24   MR THORACIC SPINE W WO CONTRAST  Result Date: 04/23/2020 CLINICAL DATA:  Cough and shortness of breath. Spiculated nodules in adenopathy on chest CT. EXAM: MRI THORACIC AND LUMBAR SPINE WITHOUT AND WITH CONTRAST TECHNIQUE: Multiplanar and multiecho pulse sequences of the thoracic and lumbar spine were obtained without and with intravenous contrast. CONTRAST:  7.36mL GADAVIST GADOBUTROL 1 MMOL/ML IV SOLN COMPARISON:  Chest CT 04/17/2020 FINDINGS: MRI THORACIC SPINE FINDINGS Alignment:  Normal Vertebrae: Sclerotic focus in the T11 spinous process without contrast enhancement. Bone marrow signal otherwise normal. Cord:  Normal signal and morphology. Paraspinal and other soft tissues: Pleural effusions with right medial loculation. Incompletely visualized spiculated areas of distortion in the right upper lobe. Disc levels: No spinal canal or neural foraminal stenosis. MRI LUMBAR SPINE FINDINGS Segmentation:  Standard. Alignment:  Physiologic.  Vertebrae:  No fracture, evidence of discitis, or bone lesion. Conus medullaris: Extends to the L1 level and appears normal. Paraspinal and other soft tissues: Multiple fluid collections within the psoas and iliacus muscles, right greater than left. Disc levels: L1-2: Disc desiccation without stenosis. L2-3: Mild disc bulge without spinal canal stenosis. Mild bilateral foraminal narrowing. L3-4: Left asymmetric intermediate sized disc bulge. Mild spinal canal stenosis and mild left foraminal stenosis. L4-5: Intermediate disc bulge with no spinal canal stenosis. Mild right foraminal stenosis. L5-S1: Mild disc bulge and mild facet hypertrophy. No spinal canal stenosis. No neural foraminal stenosis. IMPRESSION: 1. No evidence of metastatic disease to the thoracic or lumbar spine. 2. Multiple fluid collections within the psoas and iliacus muscles, right greater than left, consistent with intramuscular hematomas. Dedicated imaging of this region is recommended. 3. Pleural effusions. 4. Mild multilevel lumbar degenerative disc disease with mild spinal canal stenosis at L3-4. Electronically Signed   By: Ulyses Jarred M.D.   On: 04/23/2020 02:08   MR Lumbar Spine W Wo Contrast  Result Date: 04/23/2020 : CLINICAL DATA: Cough and shortness of breath. Spiculated nodules in adenopathy on chest CT. EXAM: MRI THORACIC AND LUMBAR SPINE WITHOUT AND WITH CONTRAST TECHNIQUE: Multiplanar and multiecho pulse sequences of the thoracic and lumbar spine were obtained without and with intravenous contrast. CONTRAST: 7.1mL GADAVIST GADOBUTROL 1 MMOL/ML IV SOLN COMPARISON: Chest CT 04/17/2020 FINDINGS: MRI THORACIC SPINE FINDINGS Alignment: Normal Vertebrae: Sclerotic focus in the T11 spinous process without contrast enhancement. Bone marrow signal otherwise normal. Cord: Normal signal and morphology. Paraspinal and other soft tissues: Pleural effusions with right medial loculation. Incompletely visualized spiculated areas of distortion  in the right upper lobe. Disc levels: No spinal canal or neural foraminal stenosis. MRI LUMBAR SPINE FINDINGS Segmentation: Standard. Alignment: Physiologic. Vertebrae: No fracture, evidence of discitis, or bone lesion. Conus medullaris: Extends to the L1 level and appears normal. Paraspinal and other soft tissues: Multiple fluid collections within the psoas and iliacus muscles, right greater than left. Disc levels: L1-2: Disc desiccation without stenosis. L2-3:  Mild disc bulge without spinal canal stenosis. Mild bilateral foraminal narrowing. L3-4: Left asymmetric intermediate sized disc bulge. Mild spinal canal stenosis and mild left foraminal stenosis. L4-5: Intermediate disc bulge with no spinal canal stenosis. Mild right foraminal stenosis. L5-S1: Mild disc bulge and mild facet hypertrophy. No spinal canal stenosis. No neural foraminal stenosis. IMPRESSION: 1. No evidence of metastatic disease to the thoracic or lumbar spine. 2. Multiple fluid collections within the psoas and iliacus muscles, right greater than left, consistent with intramuscular hematomas. Dedicated imaging of this region is recommended. 3. Pleural effusions. 4. Mild multilevel lumbar degenerative disc disease with mild spinal canal stenosis at L3-4. Electronically Signed   By: Ulyses Jarred M.D.   On: 04/23/2020 02:12   MR PELVIS W WO CONTRAST  Result Date: 04/23/2020 CLINICAL DATA:  Thrombocytosis, malaise, cough, possible thoracic malignancy EXAM: MRI PELVIS WITHOUT AND WITH CONTRAST TECHNIQUE: Multiplanar multisequence MR imaging of the pelvis was performed both before and after administration of intravenous contrast. CONTRAST:  7.42mL GADAVIST GADOBUTROL 1 MMOL/ML IV SOLN COMPARISON:  CT abdomen/pelvis dated 09/20/2017 FINDINGS: Urinary Tract:  Bladder is within normal limits. Bowel:  Visualized bowel is grossly unremarkable. Vascular/Lymphatic: No evidence of aneurysm. No suspicious pelvic lymphadenopathy. Reproductive:  Prostate is  unremarkable. Other: Intramuscular hematomas within the bilateral psoas and iliacus muscles, right greater than left, incompletely visualized. Largest axial measurement 7.2 x 7.4 cm on the right. Associated layering retroperitoneal fluid bilaterally. Layering fluid-fluid level in the dependent pelvis. Musculoskeletal: Body wall edema.  No focal osseous lesions. IMPRESSION: Intramuscular hematomas within the bilateral psoas and iliacus muscles, right greater than left, incompletely visualized. Correlate with laboratory evaluation if patient is anticoagulated. Electronically Signed   By: Julian Hy M.D.   On: 04/23/2020 04:18   CT ABDOMEN PELVIS W CONTRAST  Result Date: 04/23/2020 CLINICAL DATA:  Retroperitoneal hematoma, follow-up. Abdominal pain with distension. COVID positive. EXAM: CT ABDOMEN AND PELVIS WITH CONTRAST TECHNIQUE: Multidetector CT imaging of the abdomen and pelvis was performed using the standard protocol following bolus administration of intravenous contrast. CONTRAST:  14mL OMNIPAQUE IOHEXOL 300 MG/ML  SOLN COMPARISON:  CT abdomen dated 08/20/2017. MRI lumbar spine from earlier same day. FINDINGS: Lower chest: Bibasilar pleural effusions. Mixed consolidations and ground-glass opacities at the bilateral lung bases, corresponding to the chest CT findings of 04/17/2020 suggesting multifocal pneumonia. Hepatobiliary: Gallbladder is filled with stones. No pericholecystic inflammation or other signs of acute cholecystitis. Common bile duct appears stable in caliber compared to the earlier CT of 08/20/2017. No focal mass or lesion is seen within the liver. Pancreas: Unremarkable. No pancreatic ductal dilatation or surrounding inflammatory changes. Spleen: Hypodense collection within the splenic hilum, measuring 4.9 x 4 cm, most likely acute blood products, suspected active hemorrhage/extravasation at the upper margin of the collection (anterior margin of the spleen). Adrenals/Urinary Tract:  Adrenal glands appear normal. Bilateral renal cysts. No renal stone or hydronephrosis. Bladder is partially decompressed, containing hyperdense material which most likely represents IV contrast excretion. Stomach/Bowel: No dilated large or small bowel loops. No evidence of bowel wall inflammation. Stomach is unremarkable, partially decompressed. Vascular/Lymphatic: Aortic atherosclerosis. No enlarged lymph nodes seen. Reproductive: Prostate is unremarkable. Other: Complex mixed-density collections are present within the bilateral iliopsoas musculature, compatible with the intramuscular hematomas described on earlier MRI exams, RIGHT perhaps slightly greater overall size/thickness than on the LEFT, but more craniocaudal extension within the LEFT iliac muscle to the LEFT upper thigh. These hematomas have not significantly changed in the short-term interval. Small amount of  free fluid in the pelvis. Musculoskeletal: No acute appearing osseous abnormality. Ill-defined fluid/edema within the subcutaneous soft tissues indicating anasarca. IMPRESSION: 1. Hypodense collection within the splenic hilum, measuring 4.9 x 4 cm, compatible with acute hematoma. Questionable focal active hemorrhage/extravasation at the upper margin of this perisplenic collection, but not convincing. Recommend short-term follow-up CT to ensure stability. 2. Large intramuscular hematomas involving the bilateral iliopsoas musculature, as described on today's earlier MRI exams and not significantly changed in the short-term interval. 3. Active hemorrhage/contrast extravasation is identified within the RIGHT iliopsoas hematoma (series 2, images 42 through 45; series 7, images 28 and 29). Recommend short-term follow-up CT to ensure resolution. 4. Bibasilar pleural effusions. Mixed consolidations and ground-glass opacities at the bilateral lung bases, corresponding to the chest CT findings of 04/17/2020 suggesting multifocal pneumonia. 5. Cholelithiasis  without evidence of acute cholecystitis. 6. Small amount of free fluid in the pelvis. 7. Anasarca. Aortic Atherosclerosis (ICD10-I70.0). These results and recommendations were called by telephone at the time of interpretation on 04/23/2020 at 2:31 pm to provider Dr. Sherral Hammers, who verbally acknowledged these results. Electronically Signed   By: Franki Cabot M.D.   On: 04/23/2020 14:39   DG Chest Port 1 View  Result Date: 04/22/2020 CLINICAL DATA:  Unresponsive this morning.  COVID positive EXAM: PORTABLE CHEST 1 VIEW COMPARISON:  Five days ago FINDINGS: Patchy bilateral pneumonia with similar distribution but likely decreasing density. Normal heart size. 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 No visible effusion or air leak. Hyperinflation in the setting of COPD. Calcification and pleural thickening asymmetric at the right apex. IMPRESSION: Bilateral pneumonia with mildly improved aeration. Reference recent chest CT concerning the findings of possible intrathoracic malignancy. Electronically Signed   By: Monte Fantasia M.D.   On: 04/22/2020 09:09   ECHOCARDIOGRAM COMPLETE  Result Date: 04/18/2020    ECHOCARDIOGRAM REPORT   Patient Name:   RACHARD ISIDRO Date of Exam: 04/18/2020 Medical Rec #:  [id number]         Height:       62.0 in Accession #:    [id number]        Weight:       114.0 lb Date of Birth:  [birth year]          BSA:          1.505 m Patient Age:    86 years          BP:           130/89 mmHg Patient Gender: M                 HR:           113 bpm. Exam Location:  [facility] Procedure: 2D Echo, Color Doppler and Cardiac Doppler Indications:     I48.91 Atrial fibrillation  History:         Patient has no prior history of Echocardiogram examinations.                  Risk Factors:Hypertension. Pt tested positive for COVID-19 on                  04/17/2020.  Sonographer:     Charmayne Sheer RDCS (AE) Referring Phys:  [id number] [place] Diagnosing Phys: Ida Rogue MD IMPRESSIONS  1. Left ventricular ejection  fraction, by estimation, is 55 %. The left ventricle has normal function. The left ventricle has no regional wall motion abnormalities. Left ventricular diastolic parameters are indeterminate.  2. Right ventricular  systolic function is normal. The right ventricular size is normal. There is moderately elevated pulmonary artery systolic pressure. The estimated right ventricular systolic pressure is 16.1 mmHg.  3. The inferior vena cava is dilated in size with <50% respiratory variability, suggesting right atrial pressure of 15 mmHg. FINDINGS  Left Ventricle: Left ventricular ejection fraction, by estimation, is 55 to 60%. The left ventricle has normal function. The left ventricle has no regional wall motion abnormalities. The left ventricular internal cavity size was normal in size. There is  no left ventricular hypertrophy. Left ventricular diastolic parameters are indeterminate. Right Ventricle: The right ventricular size is normal. No increase in right ventricular wall thickness. Right ventricular systolic function is normal. There is moderately elevated pulmonary artery systolic pressure. The tricuspid regurgitant velocity is 3.19 m/s, and with an assumed right atrial pressure of 10 mmHg, the estimated right ventricular systolic pressure is 09.6 mmHg. Left Atrium: Left atrial size was normal in size. Right Atrium: Right atrial size was normal in size. Pericardium: A small pericardial effusion is present. Mitral Valve: The mitral valve is normal in structure. Normal mobility of the mitral valve leaflets. No evidence of mitral valve regurgitation. No evidence of mitral valve stenosis. MV peak gradient, 8.1 mmHg. The mean mitral valve gradient is 3.0 mmHg. Tricuspid Valve: The tricuspid valve is normal in structure. Tricuspid valve regurgitation is mild . No evidence of tricuspid stenosis. Aortic Valve: The aortic valve was not well visualized. Aortic valve regurgitation is not visualized. Mild to moderate aortic  valve sclerosis/calcification is present, without any evidence of aortic stenosis. Aortic valve mean gradient measures 6.0 mmHg.  Aortic valve peak gradient measures 10.9 mmHg. Aortic valve area, by VTI measures 1.67 cm. Pulmonic Valve: The pulmonic valve was normal in structure. Pulmonic valve regurgitation is not visualized. No evidence of pulmonic stenosis. Aorta: The aortic root is normal in size and structure. Venous: The inferior vena cava is dilated in size with less than 50% respiratory variability, suggesting right atrial pressure of 15 mmHg. IAS/Shunts: No atrial level shunt detected by color flow Doppler.  LEFT VENTRICLE PLAX 2D LVIDd:         4.57 cm  Diastology LVIDs:         3.36 cm  LV e' lateral:   8.59 cm/s LV PW:         0.90 cm  LV E/e' lateral: 16.1 LV IVS:        0.66 cm  LV e' medial:    7.83 cm/s LVOT diam:     2.10 cm  LV E/e' medial:  17.7 LV SV:         42 LV SV Index:   28 LVOT Area:     3.46 cm  RIGHT VENTRICLE RV Basal diam:  3.32 cm LEFT ATRIUM             Index       RIGHT ATRIUM           Index LA diam:        3.30 cm 2.19 cm/m  RA Area:     16.30 cm LA Vol (A2C):   31.0 ml 20.59 ml/m RA Volume:   45.30 ml  30.09 ml/m LA Vol (A4C):   40.0 ml 26.57 ml/m LA Biplane Vol: 35.7 ml 23.72 ml/m  AORTIC VALVE                    PULMONIC VALVE AV Area (Vmax):    1.74 cm  PV Vmax:       1.06 m/s AV Area (Vmean):   1.64 cm     PV Vmean:      61.600 cm/s AV Area (VTI):     1.67 cm     PV VTI:        0.131 m AV Vmax:           165.00 cm/s  PV Peak grad:  4.5 mmHg AV Vmean:          108.000 cm/s PV Mean grad:  2.0 mmHg AV VTI:            0.249 m AV Peak Grad:      10.9 mmHg AV Mean Grad:      6.0 mmHg LVOT Vmax:         83.00 cm/s LVOT Vmean:        51.200 cm/s LVOT VTI:          0.120 m LVOT/AV VTI ratio: 0.48  AORTA Ao Root diam: 3.10 cm MITRAL VALVE                TRICUSPID VALVE MV Area (PHT): 4.37 cm     TR Peak grad:   40.7 mmHg MV Peak grad:  8.1 mmHg     TR Vmax:        319.00  cm/s MV Mean grad:  3.0 mmHg MV Vmax:       1.42 m/s     SHUNTS MV Vmean:      81.1 cm/s    Systemic VTI:  0.12 m MV Decel Time: 174 msec     Systemic Diam: 2.10 cm MV E velocity: 138.67 cm/s Ida Rogue MD Electronically signed by Ida Rogue MD Signature Date/Time: 04/18/2020/11:01:31 AM    Final     Microbiology: Recent Results (from the past 240 hour(s))  SARS Coronavirus 2 by RT PCR (hospital order, performed in [place] hospital lab) Nasopharyngeal Nasopharyngeal Swab     Status: Abnormal   Collection Time: 04/17/20 12:02 PM   Specimen: Nasopharyngeal Swab  Result Value Ref Range Status   SARS Coronavirus 2 POSITIVE (A) NEGATIVE Final    Comment: RESULT CALLED TO, READ BACK BY AND VERIFIED WITH: GREG MOYER,RN 1412 04/17/2020 DB (NOTE) SARS-CoV-2 target nucleic acids are DETECTED  SARS-CoV-2 RNA is generally detectable in upper respiratory specimens  during the acute phase of infection.  Positive results are indicative  of the presence of the identified virus, but do not rule out bacterial infection or co-infection with other pathogens not detected by the test.  Clinical correlation with patient history and  other diagnostic information is necessary to determine patient infection status.  The expected result is negative.  Fact Sheet for Patients:   StrictlyIdeas.no   Fact Sheet for Healthcare Providers:   BankingDealers.co.za    This test is not yet approved or cleared by the [place] FDA and  has been authorized for detection and/or diagnosis of SARS-CoV-2 by FDA under an Emergency Use Authorization (EUA).  This EUA will remain in effect (meaning this test  can be used) for the duration of  the COVID-19 declaration under Section 564(b)(1) of the Act, 21 U.S.C. section 360-bbb-3(b)(1), unless the authorization is terminated or revoked sooner.  Performed at [facility], [address] [place], [place]  [address]   Blood Culture (routine x 2)     Status: None   Collection Time: 04/17/20 12:02 PM   Specimen: BLOOD  Result Value Ref  Range Status   Specimen Description BLOOD LEFT ARM  Final   Special Requests   Final    BOTTLES DRAWN AEROBIC AND ANAEROBIC Blood Culture adequate volume   Culture   Final    NO GROWTH 5 DAYS Performed at [facility], [address], [place], [place] [address]    Report Status 04/22/2020 FINAL  Final  Urine culture     Status: None   Collection Time: 04/17/20  1:05 PM   Specimen: In/Out Cath Urine  Result Value Ref Range Status   Specimen Description   Final    IN/OUT CATH URINE Performed at [facility], [address] [place], [place] [address]    Special Requests   Final    NONE Performed at [facility], [address], [place], [place] [address]    Culture   Final    NO GROWTH Performed at [facility], [place] [address] [place], [place] [address]    Report Status 04/18/2020 FINAL  Final  Blood Culture (routine x 2)     Status: None   Collection Time: 04/17/20  7:08 PM   Specimen: BLOOD  Result Value Ref Range Status   Specimen Description BLOOD RIGHT ANTECUBITAL  Final   Special Requests   Final    BOTTLES DRAWN AEROBIC AND ANAEROBIC Blood Culture adequate volume   Culture   Final    NO GROWTH 5 DAYS Performed at [facility], [address] [place], [place] [address]    Report Status 04/22/2020 FINAL  Final     Labs: Basic Metabolic Panel: Recent Labs  Lab 04/17/20 1006 04/19/20 0456 04/19/20 1018 04/20/20 0450 04/21/20 0736 04/22/20 0818 04/23/20 0542  NA  --  130*  --  131* 131* 130* 131*  K  --  4.8  --  4.4 4.3 4.1 4.6  CL  --  97*  --  100 98 98 100  CO2  --  24  --  25 23 25 23   GLUCOSE  --  110*  --  106* 130* 154* 151*  BUN  --  31*  --  30* 25* 34* 50*  CREATININE  --  1.06  --  0.80 0.83 0.86 1.48*  CALCIUM  --  8.5*  --  8.3* 8.6* 7.9* 7.7*  MG   < >  --  2.0 1.9  2.0 2.1 2.3  PHOS  --   --  3.5 3.4 3.2 3.9 4.9*   < > = values in this interval not displayed.   Liver Function Tests: Recent Labs  Lab 04/19/20 0456 04/20/20 0450 04/21/20 0736 04/22/20 0818 04/23/20 0542  AST 38 33 30 26 29   ALT 23 24 25 22 20   ALKPHOS 83 75 84 65 53  BILITOT 1.5* 1.3* 1.4* 1.2 1.2  PROT 6.5 5.8* 6.3* 5.1* 4.6*  ALBUMIN 2.9* 2.6* 2.9* 2.6* 2.4*   No results for input(s): LIPASE, AMYLASE in the last 168 hours. No results for input(s): AMMONIA in the last 168 hours. CBC: Recent Labs  Lab 04/19/20 0456 04/20/20 0450 04/21/20 0736 04/22/20 0818 04/23/20 0542  WBC 16.9* 17.0* 17.4* 29.2* 28.9*  NEUTROABS 14.5* 14.0* 14.4* 22.5* 21.5*  HGB 12.5* 12.5* 14.1 10.6* 8.0*  HCT 37.1* 36.7* 40.0 31.0* 22.6*  MCV 95.4 93.9 91.5 93.9 92.2  PLT 574* 605* 703* 887* 843*   Cardiac Enzymes: No results for input(s): CKTOTAL, CKMB, CKMBINDEX, TROPONINI in the last 168 hours. BNP: BNP (last 3 results)  No results for input(s): BNP in the last 8760 hours.  ProBNP (last 3 results) No results for input(s): PROBNP in the last 8760 hours.  CBG: No results for input(s): GLUCAP in the last 168 hours.     Signed:  Dia Crawford, MD [facility] [phone] pager

## 2020-04-22 NOTE — Progress Notes (Signed)
NT called for help to patient room. Patient was noted laying in the floor. Per NT she assisted patient to the bedside commode. Per NT when she returned to check on patient he was laying in the floor. Per patient, he stated, "I just got down here because its better for me." RN asked if patient lay on the floor at home. Patient replied, " I always lay on the floor." Assigned RN and CN attempted to redirect and patient continue to refuse. RN assessed head-to-toe for any injuries since nobody witnessed patient lay himself on the floor. No injures noted. Patient stated he did not hit his head because he laid down. AC and MD made aware. Will continue to monitor.

## 2020-04-22 NOTE — Progress Notes (Addendum)
Ch arrived at 1C entrance in response to [place]. Code called off when Ch arrived. Will pass on visit to On-call Ch to follow up.

## 2020-04-22 NOTE — Progress Notes (Addendum)
Pt's SBP increased from low 60's to high 90's after 1L IVF bolus.  Postponed MRI until BP was more stable.

## 2020-04-23 ENCOUNTER — Inpatient Hospital Stay: Payer: Medicare Other

## 2020-04-23 ENCOUNTER — Encounter: Payer: Self-pay | Admitting: Internal Medicine

## 2020-04-23 DIAGNOSIS — J189 Pneumonia, unspecified organism: Secondary | ICD-10-CM | POA: Diagnosis not present

## 2020-04-23 DIAGNOSIS — S7011XS Contusion of right thigh, sequela: Secondary | ICD-10-CM

## 2020-04-23 DIAGNOSIS — R0602 Shortness of breath: Secondary | ICD-10-CM | POA: Diagnosis not present

## 2020-04-23 DIAGNOSIS — U071 COVID-19: Secondary | ICD-10-CM | POA: Diagnosis not present

## 2020-04-23 DIAGNOSIS — S7010XA Contusion of unspecified thigh, initial encounter: Secondary | ICD-10-CM | POA: Diagnosis present

## 2020-04-23 DIAGNOSIS — N400 Enlarged prostate without lower urinary tract symptoms: Secondary | ICD-10-CM | POA: Diagnosis not present

## 2020-04-23 DIAGNOSIS — D473 Essential (hemorrhagic) thrombocythemia: Secondary | ICD-10-CM | POA: Diagnosis not present

## 2020-04-23 LAB — CBC
HCT: 17.1 % — ABNORMAL LOW (ref 39.0–52.0)
Hemoglobin: 6 g/dL — ABNORMAL LOW (ref 13.0–17.0)
MCH: 32.6 pg (ref 26.0–34.0)
MCHC: 35.1 g/dL (ref 30.0–36.0)
MCV: 92.9 fL (ref 80.0–100.0)
Platelets: 890 10*3/uL — ABNORMAL HIGH (ref 150–400)
RBC: 1.84 MIL/uL — ABNORMAL LOW (ref 4.22–5.81)
RDW: 29.5 % — ABNORMAL HIGH (ref 11.5–15.5)
WBC: 31.8 10*3/uL — ABNORMAL HIGH (ref 4.0–10.5)
nRBC: 3.1 % — ABNORMAL HIGH (ref 0.0–0.2)

## 2020-04-23 LAB — CBC WITH DIFFERENTIAL/PLATELET
Abs Immature Granulocytes: 2.61 10*3/uL — ABNORMAL HIGH (ref 0.00–0.07)
Basophils Absolute: 0 10*3/uL (ref 0.0–0.1)
Basophils Relative: 0 %
Eosinophils Absolute: 0 10*3/uL (ref 0.0–0.5)
Eosinophils Relative: 0 %
HCT: 22.6 % — ABNORMAL LOW (ref 39.0–52.0)
Hemoglobin: 8 g/dL — ABNORMAL LOW (ref 13.0–17.0)
Immature Granulocytes: 9 %
Lymphocytes Relative: 8 %
Lymphs Abs: 2.4 10*3/uL (ref 0.7–4.0)
MCH: 32.7 pg (ref 26.0–34.0)
MCHC: 35.4 g/dL (ref 30.0–36.0)
MCV: 92.2 fL (ref 80.0–100.0)
Monocytes Absolute: 2.3 10*3/uL — ABNORMAL HIGH (ref 0.1–1.0)
Monocytes Relative: 8 %
Neutro Abs: 21.5 10*3/uL — ABNORMAL HIGH (ref 1.7–7.7)
Neutrophils Relative %: 75 %
Platelets: 843 10*3/uL — ABNORMAL HIGH (ref 150–400)
RBC: 2.45 MIL/uL — ABNORMAL LOW (ref 4.22–5.81)
RDW: 28.9 % — ABNORMAL HIGH (ref 11.5–15.5)
Smear Review: UNDETERMINED
WBC: 28.9 10*3/uL — ABNORMAL HIGH (ref 4.0–10.5)
nRBC: 0.9 % — ABNORMAL HIGH (ref 0.0–0.2)

## 2020-04-23 LAB — FERRITIN: Ferritin: 262 ng/mL (ref 24–336)

## 2020-04-23 LAB — FIBRIN DERIVATIVES D-DIMER (ARMC ONLY): Fibrin derivatives D-dimer (ARMC): 353.66 ng/mL (FEU) (ref 0.00–499.00)

## 2020-04-23 LAB — COMPREHENSIVE METABOLIC PANEL
ALT: 20 U/L (ref 0–44)
AST: 29 U/L (ref 15–41)
Albumin: 2.4 g/dL — ABNORMAL LOW (ref 3.5–5.0)
Alkaline Phosphatase: 53 U/L (ref 38–126)
Anion gap: 8 (ref 5–15)
BUN: 50 mg/dL — ABNORMAL HIGH (ref 8–23)
CO2: 23 mmol/L (ref 22–32)
Calcium: 7.7 mg/dL — ABNORMAL LOW (ref 8.9–10.3)
Chloride: 100 mmol/L (ref 98–111)
Creatinine, Ser: 1.48 mg/dL — ABNORMAL HIGH (ref 0.61–1.24)
GFR calc Af Amer: 49 mL/min — ABNORMAL LOW (ref >60)
GFR calc non Af Amer: 42 mL/min — ABNORMAL LOW (ref >60)
Glucose, Bld: 151 mg/dL — ABNORMAL HIGH (ref 70–99)
Potassium: 4.6 mmol/L (ref 3.5–5.1)
Sodium: 131 mmol/L — ABNORMAL LOW (ref 135–145)
Total Bilirubin: 1.2 mg/dL (ref 0.3–1.2)
Total Protein: 4.6 g/dL — ABNORMAL LOW (ref 6.5–8.1)

## 2020-04-23 LAB — C-REACTIVE PROTEIN: CRP: 1.3 mg/dL — ABNORMAL HIGH (ref <1.0)

## 2020-04-23 LAB — PHOSPHORUS: Phosphorus: 4.9 mg/dL — ABNORMAL HIGH (ref 2.5–4.6)

## 2020-04-23 LAB — QUANTIFERON-TB GOLD PLUS (RQFGPL)
QuantiFERON Mitogen Value: 0.13 IU/mL
QuantiFERON Nil Value: 0 IU/mL
QuantiFERON TB1 Ag Value: 0 IU/mL
QuantiFERON TB2 Ag Value: 0.01 IU/mL

## 2020-04-23 LAB — QUANTIFERON-TB GOLD PLUS: QuantiFERON-TB Gold Plus: UNDETERMINED — AB

## 2020-04-23 LAB — LACTATE DEHYDROGENASE: LDH: 315 U/L — ABNORMAL HIGH (ref 98–192)

## 2020-04-23 LAB — MAGNESIUM: Magnesium: 2.3 mg/dL (ref 1.7–2.4)

## 2020-04-23 IMAGING — CT CT ABD-PELV W/ CM
2 of 5 series · 15 of 46 positions shown, 17 images · IV contrast (omnipaque)
Comparison: CT abdomen dated [DATE]. MRI lumbar spine from
earlier same day.

CLINICAL DATA: Retroperitoneal hematoma, follow-up. Abdominal pain
with distension. COVID positive.

EXAM:
CT ABDOMEN AND PELVIS WITH CONTRAST
TECHNIQUE: Multidetector CT imaging of the abdomen and pelvis was performed
using the standard protocol following bolus administration of
intravenous contrast.
CONTRAST:  100mL OMNIPAQUE IOHEXOL 300 MG/ML  SOLN

[Series 2: axial st · axial · 0.84mm/px · z∈[+452,+852]mm · 12 of 90 slices shown, 14 images]
[im 5/90  soft-tissue]
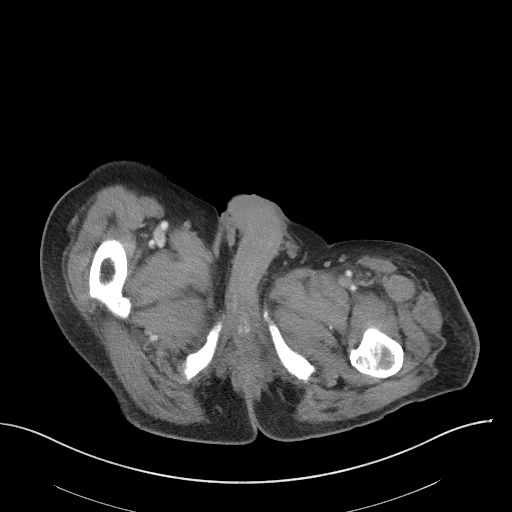
[im 5/90  bone]
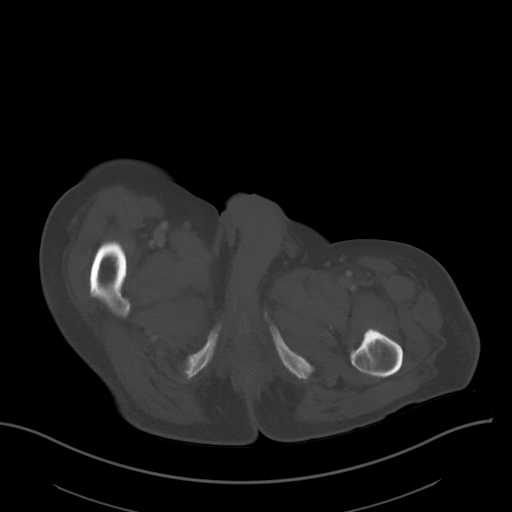
[im 15/90  soft-tissue]
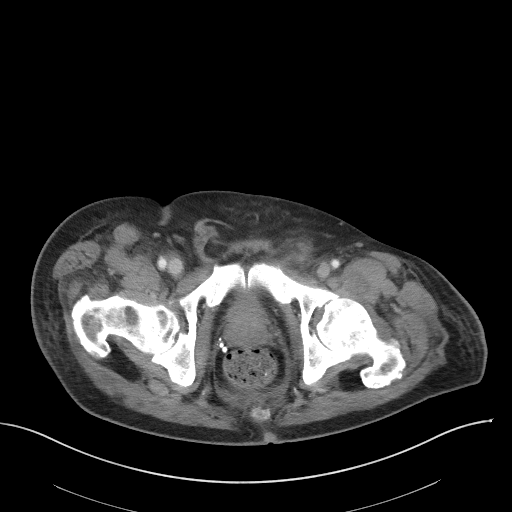
[im 19/90  soft-tissue]
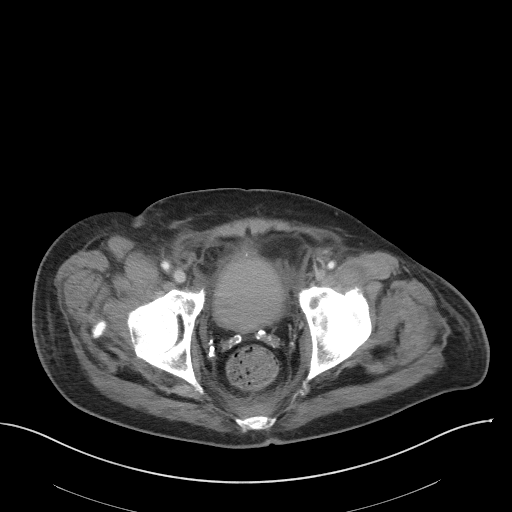
[im 29/90  soft-tissue]
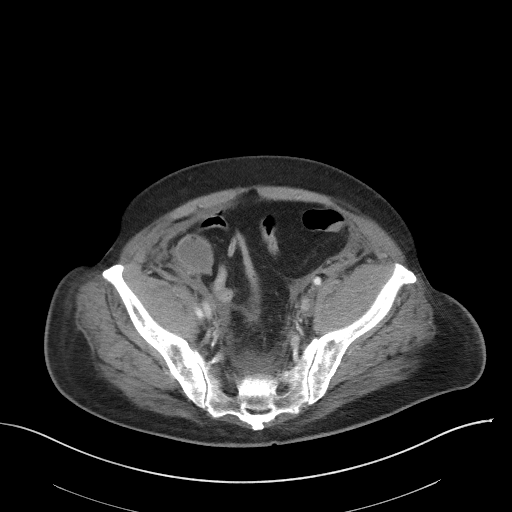
[im 33/90  soft-tissue]
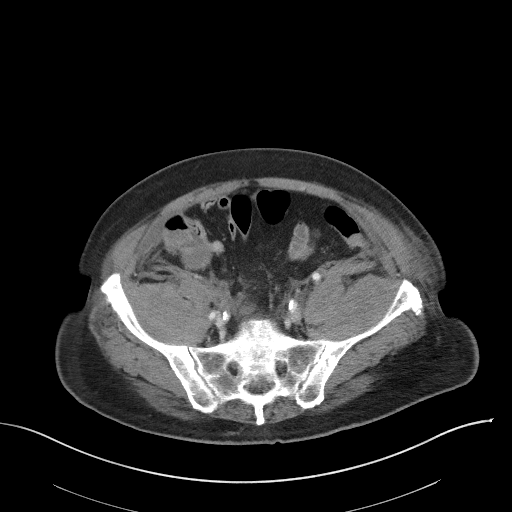
[im 43/90  soft-tissue]
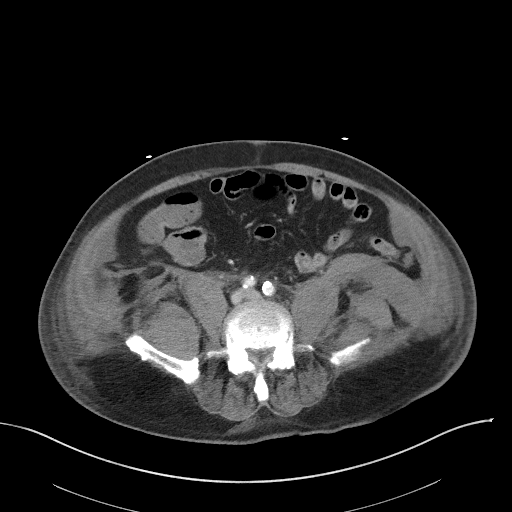
[im 47/90  soft-tissue]
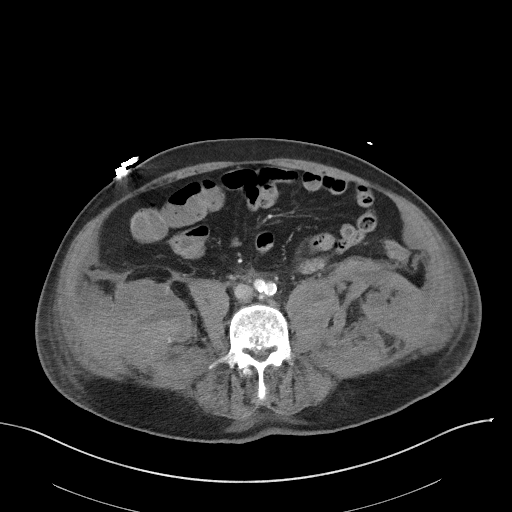
[im 57/90  soft-tissue]
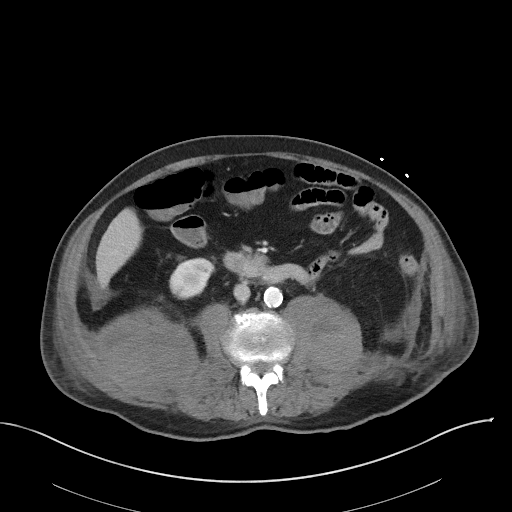
[im 61/90  soft-tissue]
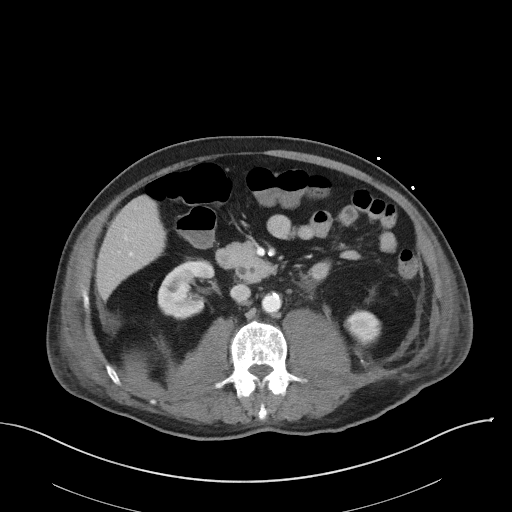
[im 61/90  bone]
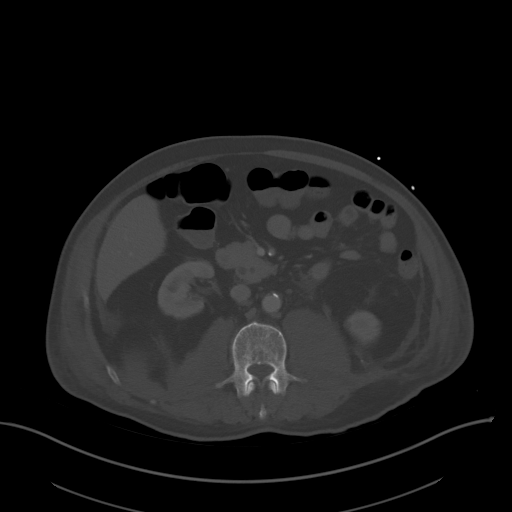
[im 71/90  soft-tissue]
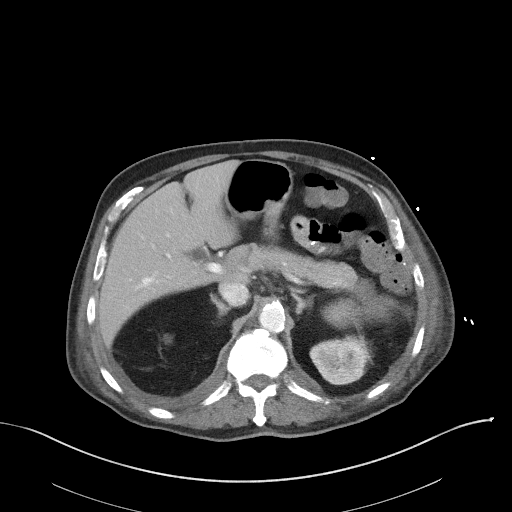
[im 75/90  soft-tissue]
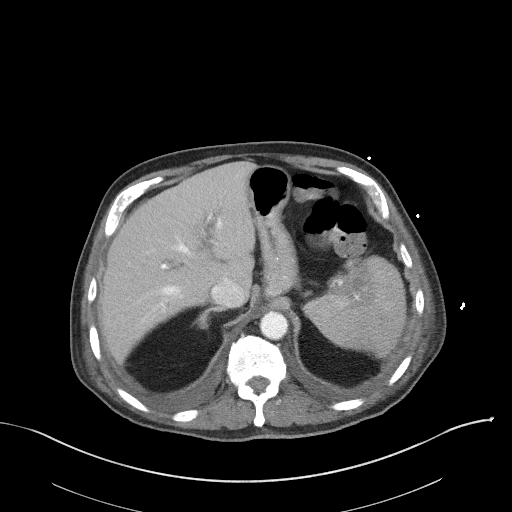
[im 85/90  soft-tissue]
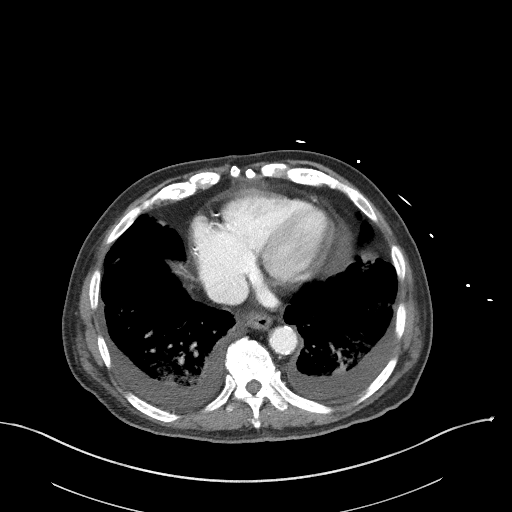

[Series 5: coronal st · coronal · 0.73mm/px · 3 of 85 slices shown]
[im 29/85  soft-tissue]
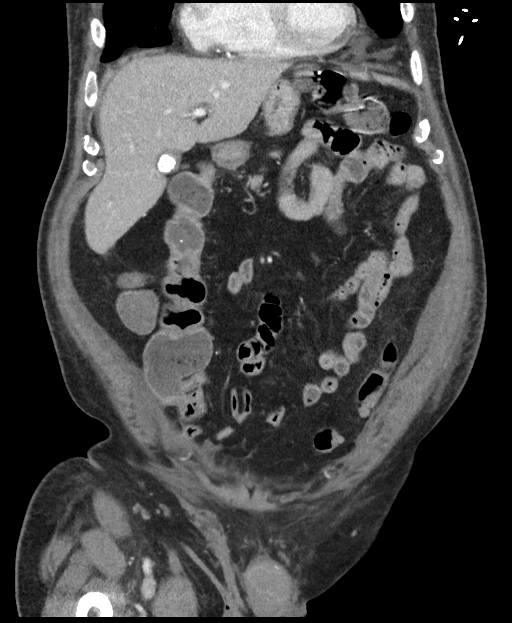
[im 38/85  soft-tissue]
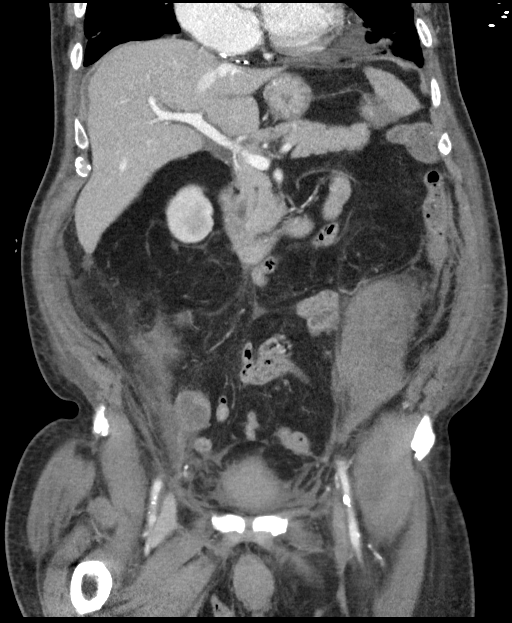
[im 47/85  soft-tissue]
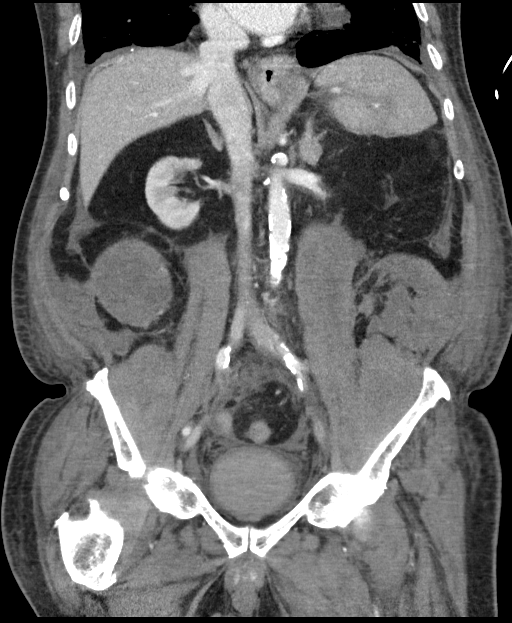

[15 of 46 positions shown; findings below may reference images not displayed]

FINDINGS: Lower chest: Bibasilar pleural effusions. Mixed consolidations and
ground-glass opacities at the bilateral lung bases, corresponding to
the chest CT findings of [DATE] suggesting multifocal pneumonia.

Hepatobiliary: Gallbladder is filled with stones. No pericholecystic
inflammation or other signs of acute cholecystitis. Common bile duct
appears stable in caliber compared to the earlier CT of [DATE].
No focal mass or lesion is seen within the liver.

Pancreas: Unremarkable. No pancreatic ductal dilatation or
surrounding inflammatory changes.

Spleen: Hypodense collection within the splenic hilum, measuring
x 4 cm, most likely acute blood products, suspected active
hemorrhage/extravasation at the upper margin of the collection
(anterior margin of the spleen).

Adrenals/Urinary Tract: Adrenal glands appear normal. Bilateral
renal cysts. No renal stone or hydronephrosis. Bladder is partially
decompressed, containing hyperdense material which most likely
represents IV contrast excretion.

Stomach/Bowel: No dilated large or small bowel loops. No evidence of
bowel wall inflammation. Stomach is unremarkable, partially
decompressed.

Vascular/Lymphatic: Aortic atherosclerosis. No enlarged lymph nodes
seen.

Reproductive: Prostate is unremarkable.

Other: Complex mixed-density collections are present within the
bilateral iliopsoas musculature, compatible with the intramuscular
hematomas described on earlier MRI exams, RIGHT perhaps slightly
greater overall size/thickness than on the LEFT, but more
craniocaudal extension within the LEFT iliac muscle to the LEFT
upper thigh. These hematomas have not significantly changed in the
short-term interval.

Small amount of free fluid in the pelvis.

Musculoskeletal: No acute appearing osseous abnormality.

Ill-defined fluid/edema within the subcutaneous soft tissues
indicating anasarca.
IMPRESSION: 1. Hypodense collection within the splenic hilum, measuring 4.9 x 4
cm, compatible with acute hematoma. Questionable focal active
hemorrhage/extravasation at the upper margin of this perisplenic
collection, but not convincing. Recommend short-term follow-up CT to
ensure stability.
2. Large intramuscular hematomas involving the bilateral iliopsoas
musculature, as described on today's earlier MRI exams and not
significantly changed in the short-term interval.
3. Active hemorrhage/contrast extravasation is identified within the
RIGHT iliopsoas hematoma (series 2, images 42 through 45; series 7,
images 28 and 29). Recommend short-term follow-up CT to ensure
resolution.
4. Bibasilar pleural effusions. Mixed consolidations and
ground-glass opacities at the bilateral lung bases, corresponding to
the chest CT findings of [DATE] suggesting multifocal pneumonia.
5. Cholelithiasis without evidence of acute cholecystitis.
6. Small amount of free fluid in the pelvis.
7. Anasarca.

Aortic Atherosclerosis ([P1]-[P1]).

These results and recommendations were called by telephone at the
time of interpretation on [DATE] at [DATE] to provider Dr. JHEMBOY,
who verbally acknowledged these results.

## 2020-04-23 MED ORDER — LACTATED RINGERS IV BOLUS
1000.0000 mL | Freq: Once | INTRAVENOUS | Status: AC
  Administered 2020-04-23: 1000 mL via INTRAVENOUS

## 2020-04-23 MED ORDER — IOHEXOL 300 MG/ML  SOLN
100.0000 mL | Freq: Once | INTRAMUSCULAR | Status: AC | PRN
  Administered 2020-04-23: 100 mL via INTRAVENOUS

## 2020-04-23 MED ORDER — PANTOPRAZOLE SODIUM 40 MG PO TBEC
40.0000 mg | DELAYED_RELEASE_TABLET | Freq: Two times a day (BID) | ORAL | 0 refills | Status: DC
Start: 2020-04-23 — End: 2020-04-27

## 2020-04-23 MED ORDER — DIGOXIN 125 MCG PO TABS
0.0625 mg | ORAL_TABLET | Freq: Every day | ORAL | Status: DC
  Administered 2020-04-23 – 2020-04-27 (×5): 0.0625 mg via ORAL
  Filled 2020-04-23 (×6): qty 0.5

## 2020-04-23 MED ORDER — METOPROLOL TARTRATE 37.5 MG PO TABS: 12.5000 mg | ORAL_TABLET | Freq: Two times a day (BID) | ORAL | 0 refills | Status: DC

## 2020-04-23 MED ORDER — SODIUM CHLORIDE 0.9 % IV BOLUS
500.0000 mL | Freq: Once | INTRAVENOUS | Status: AC
  Administered 2020-04-23: 500 mL via INTRAVENOUS

## 2020-04-23 MED ORDER — ALBUTEROL SULFATE HFA 108 (90 BASE) MCG/ACT IN AERS
1.0000 | INHALATION_SPRAY | RESPIRATORY_TRACT | 0 refills | Status: DC | PRN
Start: 2020-04-23 — End: 2020-04-27

## 2020-04-23 MED ORDER — ALBUMIN HUMAN 25 % IV SOLN
25.0000 g | Freq: Once | INTRAVENOUS | Status: AC
  Administered 2020-04-23: 09:00:00 25 g via INTRAVENOUS
  Filled 2020-04-23: qty 100

## 2020-04-23 MED ORDER — IPRATROPIUM-ALBUTEROL 20-100 MCG/ACT IN AERS: 1.0000 | INHALATION_SPRAY | Freq: Four times a day (QID) | RESPIRATORY_TRACT | 0 refills | Status: DC

## 2020-04-23 MED ORDER — GADOBUTROL 1 MMOL/ML IV SOLN
4.0000 mL | Freq: Once | INTRAVENOUS | Status: AC | PRN
  Administered 2020-04-23: 7.5 mL via INTRAVENOUS

## 2020-04-23 MED ORDER — ONDANSETRON HCL 4 MG PO TABS: 4.0000 mg | ORAL_TABLET | Freq: Four times a day (QID) | ORAL | 0 refills | Status: DC | PRN

## 2020-04-23 MED ORDER — SODIUM CHLORIDE 0.9 % IV BOLUS
1000.0000 mL | Freq: Once | INTRAVENOUS | Status: AC
  Administered 2020-04-23: 11:00:00 1000 mL via INTRAVENOUS

## 2020-04-23 MED ORDER — DIGOXIN 62.5 MCG PO TABS: 0.0625 mg | ORAL_TABLET | Freq: Every day | ORAL | 0 refills | Status: DC

## 2020-04-23 MED ORDER — GUAIFENESIN-DM 100-10 MG/5ML PO SYRP: 10.0000 mL | ORAL_SOLUTION | ORAL | 0 refills | Status: DC | PRN

## 2020-04-23 NOTE — Progress Notes (Signed)
Pt's BP 88/61, HR 131.  O2 sats in the 100% on 2L O2.  Pt awake and alert, answers appropriately and denies pain.  Mews score 4 due to low BP and elevated HR. During the night MEWS score was 4 for same.  Called Dr Sherral Hammers to check on the plan of care since pt is hypotensive and HR up to 150's A.fib not sustaining.  Dr. Sherral Hammers ordered Albumin, 1L bolus and pt will be transfer to ICU as soon as bed is available.  AC and charge nurse made aware.  Will continue to monitor.

## 2020-04-23 NOTE — Progress Notes (Signed)
Pt s/p albumin infusion.  BP improved 104/51, HR in the 130's. O2 sats 98% on 3L O2.  Metoprolol PO given.  Bolus infusing.  Per MD to give 1/2L bolus instead of 1L.  Will continue to monitor. Per Dr. Sherral Hammers pt will remain in 1C.  Transfer cancelled.   Per MD to d/c aspirin order.

## 2020-04-23 NOTE — Progress Notes (Addendum)
Pt is being discharged home.  Awaiting home oxygen prior to discharge.   This Probation officer called pt's son, Matai Carpenito to educate about the AVS.  Pt's son verbalized understanding.  Night RN will call son to pick up pt once oxygen will be delivered.

## 2020-04-23 NOTE — TOC Transition Note (Signed)
Transition of Care [facility]) - CM/SW Discharge Note   Patient Details  Name: Clinton Gordon MRN: [id number] Date of Birth: May 01, 1930  Transition of Care [facility]) CM/SW Contact:  Shelbie Hutching, RN Phone Number: 04/23/2020, 5:53 PM   Clinical Narrative:    Celesta Aver will be delivering the oxygen tonight to the hospital.  [name], the son will be picking the patient up tonight once the oxygen has been delivered.  Home Health arranged with Sharmon Revere through [facility], orders are in.    Final next level of care: Home w Home Health Services Barriers to Discharge: Barriers Resolved   Patient Goals and CMS Choice   CMS Medicare.gov Compare Post Acute Care list provided to:: Patient Represenative (must comment) Choice offered to / list presented to : Adult Children [name] - son)  Discharge Placement                       Discharge Plan and Services   Discharge Planning Services: CM Consult Post Acute Care Choice: Home Health          DME Arranged: Oxygen DME Agency: Other - Comment Celesta Aver) Date DME Agency Contacted: 04/23/20 Time DME Agency Contacted: [phone] Representative spoke with at DME Agency: Brenton Grills HH Arranged: RN, PT, OT [facility] Agency: [place] Date [place]: 04/23/20 Time [place]: 1642 Representative spoke with at [place]: [place] ([place]) Interventions     Readmission Risk Interventions No flowsheet data found.

## 2020-04-23 NOTE — Progress Notes (Signed)
Pt's son reports that 02 is set up at home and he will be here in 20 minutes to pick up pt with home 02.

## 2020-04-23 NOTE — Progress Notes (Signed)
Pt returned from MRI; SBP are back into the 70's. Hospitalist notified; ordered to administer 500 ml bolus.  SBP remains in the 70 post bolus.

## 2020-04-23 NOTE — Progress Notes (Signed)
Pt transferred from bed to wheelchair to be discharged home.  Pt became unresponsive and incontinent upon transfer.  Coincidentally, hospitalist provider came into room at that time.  Pt placed back into bed and placed into trendelenburg and he then returned to baseline.  Discharge home has been cancelled..  Both IV's were removed for discharge; pt currently has no IV access.

## 2020-04-23 NOTE — Progress Notes (Signed)
SATURATION QUALIFICATIONS: (This note is used to comply with regulatory documentation for home oxygen)  Patient Saturations on Room Air at Rest = 92%  Patient Saturations on Room Air while Ambulating = Please see the note below.  Patient Saturations on 2 Liters of oxygen while Ambulating = See note below  Please briefly explain why patient needs home oxygen:  Pt's O2 sats at rest 93%.  O2 sats dropped to 81% sitting in bed.  HR increased to 116.  Pt is weak and stayed in bed during assessment.  2L O2 reapplied, O2 sats up to mid 90's.

## 2020-04-23 NOTE — TOC Initial Note (Signed)
Transition of Care [facility] - Initial/Assessment Note    Patient Details  Name: Clinton Gordon MRN: [id number] Date of Birth: 1929-10-02  Transition of Care [facility]) CM/SW Contact:    Shelbie Hutching, RN Phone Number: 04/23/2020, 4:43 PM  Clinical Narrative:                 RNCM spoke with MD this afternoon and patient really wants to discharge home today.  MD is okay with releasing the patient into the care of the patient's son's.  Both son's verbalized that they understand the severity of the patient's current condition and the risks associated with going home.  Son Clinton Gordon will be picking the patient up this afternoon once oxygen has been delivered.  Home Health has been arranged for RN, PT, and OT.  Home oxygen referral given to [facility] with [employer].  Silvestre Moment reports to [facility] that she may not be able to deliver the O2 today because she is the only one working and she has 4 other referrals received after 4pm, she is also the only person working for [employer] today.  MD and RN updated to this fact.    Expected Discharge Plan: [place] Barriers to Discharge: Barriers Resolved   Patient Goals and CMS Choice   CMS Medicare.gov Compare Post Acute Care list provided to:: Patient Represenative (must comment) Choice offered to / list presented to : Adult Children Clinton Gordon - son)  Expected Discharge Plan and Services Expected Discharge Plan: [place]   Discharge Planning Services: CM Consult Post Acute Care Choice: [place] arrangements for the past 2 months: Single Family Home                 DME Arranged: Oxygen DME Agency: [employer] Date DME Agency Contacted: 04/23/20 Time DME Agency Contacted: 33 Representative spoke with at DME Agency: Silvestre Moment HH Arranged: RN, PT, OT [place] Agency: [place] Date [place]: 04/23/20 Time [place]: 54 Representative spoke with at [place]: Sharmon Revere  Prior Living  Arrangements/Services Living arrangements for the past 2 months: [place] with:: Self Patient language and need for interpreter reviewed:: Yes Do you feel safe going back to the place where you live?: Yes      Need for Family Participation in Patient Care: Yes (Comment) (COVID) Care giver support system in place?: Yes (comment) (sons)   Criminal Activity/Legal Involvement Pertinent to Current Situation/Hospitalization: No - Comment as needed  Activities of Daily Living Home Assistive Devices/Equipment: None ADL Screening (condition at time of admission) Patient's cognitive ability adequate to safely complete daily activities?: Yes Is the patient deaf or have difficulty hearing?: Yes Does the patient have difficulty seeing, even when wearing glasses/contacts?: No Does the patient have difficulty concentrating, remembering, or making decisions?: No Patient able to express need for assistance with ADLs?: No Does the patient have difficulty dressing or bathing?: No Independently performs ADLs?: Yes (appropriate for developmental age) Does the patient have difficulty walking or climbing stairs?: No Weakness of Legs: None Weakness of Arms/Hands: None  Permission Sought/Granted Permission sought to share information with : Case Manager, Family Supports, Other (comment) Permission granted to share information with : Yes, Verbal Permission Granted  Share Information with NAME: Clinton Gordon  Permission granted to share info w AGENCY: [facility] and [employer]  Permission granted to share info w Relationship: son     Emotional Assessment       Orientation: : Oriented  to Self, Oriented to Place, Oriented to  Time, Oriented to Situation Alcohol / Substance Use: Not Applicable Psych Involvement: No (comment)  Admission diagnosis:  Multifocal pneumonia [J18.9] Sepsis with acute hypoxic respiratory failure without septic shock, due to unspecified organism ([place]) [A41.9, R65.20,  J96.01] Pneumonia due to COVID-19 virus [U07.1, J12.82] Patient Active Problem List   Diagnosis Date Noted  . Pulmonary hypertension ([place]) 04/22/2020  . Lower extremity weakness 04/22/2020  . Thigh pain 04/22/2020  . Pneumonia due to COVID-19 virus 04/17/2020  . Hypertension   . BPH without obstruction/lower urinary tract symptoms   . Essential thrombocythemia ([place]) 01/10/2020   PCP:  Tracie Harrier, MD Pharmacy:   [facility] [address], [place] - [place] [address] [place] [address] Phone: [phone] Fax: [phone]     Social Determinants of Health (SDOH) Interventions    Readmission Risk Interventions No flowsheet data found.

## 2020-04-24 ENCOUNTER — Encounter: Payer: Self-pay | Admitting: Internal Medicine

## 2020-04-24 ENCOUNTER — Inpatient Hospital Stay: Payer: Medicare Other

## 2020-04-24 DIAGNOSIS — N400 Enlarged prostate without lower urinary tract symptoms: Secondary | ICD-10-CM | POA: Diagnosis not present

## 2020-04-24 DIAGNOSIS — D473 Essential (hemorrhagic) thrombocythemia: Secondary | ICD-10-CM | POA: Diagnosis not present

## 2020-04-24 DIAGNOSIS — D62 Acute posthemorrhagic anemia: Secondary | ICD-10-CM

## 2020-04-24 DIAGNOSIS — R0602 Shortness of breath: Secondary | ICD-10-CM | POA: Diagnosis not present

## 2020-04-24 DIAGNOSIS — U071 COVID-19: Secondary | ICD-10-CM | POA: Diagnosis not present

## 2020-04-24 DIAGNOSIS — I4891 Unspecified atrial fibrillation: Secondary | ICD-10-CM

## 2020-04-24 DIAGNOSIS — S7010XS Contusion of unspecified thigh, sequela: Secondary | ICD-10-CM

## 2020-04-24 DIAGNOSIS — A419 Sepsis, unspecified organism: Secondary | ICD-10-CM

## 2020-04-24 DIAGNOSIS — R6520 Severe sepsis without septic shock: Secondary | ICD-10-CM

## 2020-04-24 DIAGNOSIS — R578 Other shock: Secondary | ICD-10-CM

## 2020-04-24 DIAGNOSIS — J189 Pneumonia, unspecified organism: Secondary | ICD-10-CM | POA: Diagnosis not present

## 2020-04-24 LAB — PREPARE RBC (CROSSMATCH)

## 2020-04-24 LAB — CBC WITH DIFFERENTIAL/PLATELET
Abs Immature Granulocytes: 1.93 10*3/uL — ABNORMAL HIGH (ref 0.00–0.07)
Abs Immature Granulocytes: 1.91 10*3/uL — ABNORMAL HIGH (ref 0.00–0.07)
Basophils Absolute: 0 10*3/uL (ref 0.0–0.1)
Basophils Absolute: 0.1 10*3/uL (ref 0.0–0.1)
Basophils Relative: 0 %
Basophils Relative: 0 %
Eosinophils Absolute: 0 10*3/uL (ref 0.0–0.5)
Eosinophils Absolute: 0 10*3/uL (ref 0.0–0.5)
Eosinophils Relative: 0 %
Eosinophils Relative: 0 %
HCT: 17.1 % — ABNORMAL LOW (ref 39.0–52.0)
HCT: 22.4 % — ABNORMAL LOW (ref 39.0–52.0)
Hemoglobin: 5.8 g/dL — ABNORMAL LOW (ref 13.0–17.0)
Hemoglobin: 7.8 g/dL — ABNORMAL LOW (ref 13.0–17.0)
Immature Granulocytes: 7 %
Immature Granulocytes: 7 %
Lymphocytes Relative: 10 %
Lymphocytes Relative: 12 %
Lymphs Abs: 3 10*3/uL (ref 0.7–4.0)
Lymphs Abs: 3.1 10*3/uL (ref 0.7–4.0)
MCH: 32.4 pg (ref 26.0–34.0)
MCH: 32.2 pg (ref 26.0–34.0)
MCHC: 33.9 g/dL (ref 30.0–36.0)
MCHC: 34.8 g/dL (ref 30.0–36.0)
MCV: 95.5 fL (ref 80.0–100.0)
MCV: 92.6 fL (ref 80.0–100.0)
Monocytes Absolute: 2.3 10*3/uL — ABNORMAL HIGH (ref 0.1–1.0)
Monocytes Absolute: 2.1 10*3/uL — ABNORMAL HIGH (ref 0.1–1.0)
Monocytes Relative: 8 %
Monocytes Relative: 8 %
Neutro Abs: 22 10*3/uL — ABNORMAL HIGH (ref 1.7–7.7)
Neutro Abs: 18.9 10*3/uL — ABNORMAL HIGH (ref 1.7–7.7)
Neutrophils Relative %: 75 %
Neutrophils Relative %: 73 %
Platelets: 793 10*3/uL — ABNORMAL HIGH (ref 150–400)
Platelets: 680 10*3/uL — ABNORMAL HIGH (ref 150–400)
RBC: 1.79 MIL/uL — ABNORMAL LOW (ref 4.22–5.81)
RBC: 2.42 MIL/uL — ABNORMAL LOW (ref 4.22–5.81)
RDW: 29.2 % — ABNORMAL HIGH (ref 11.5–15.5)
RDW: 23.1 % — ABNORMAL HIGH (ref 11.5–15.5)
WBC Morphology: INCREASED
WBC: 29.3 10*3/uL — ABNORMAL HIGH (ref 4.0–10.5)
WBC: 26 10*3/uL — ABNORMAL HIGH (ref 4.0–10.5)
nRBC: 3.8 % — ABNORMAL HIGH (ref 0.0–0.2)
nRBC: 4.4 % — ABNORMAL HIGH (ref 0.0–0.2)

## 2020-04-24 LAB — COMPREHENSIVE METABOLIC PANEL
ALT: 25 U/L (ref 0–44)
ALT: 24 U/L (ref 0–44)
AST: 41 U/L (ref 15–41)
AST: 39 U/L (ref 15–41)
Albumin: 3 g/dL — ABNORMAL LOW (ref 3.5–5.0)
Albumin: 2.8 g/dL — ABNORMAL LOW (ref 3.5–5.0)
Alkaline Phosphatase: 54 U/L (ref 38–126)
Alkaline Phosphatase: 53 U/L (ref 38–126)
Anion gap: 10 (ref 5–15)
Anion gap: 8 (ref 5–15)
BUN: 67 mg/dL — ABNORMAL HIGH (ref 8–23)
BUN: 63 mg/dL — ABNORMAL HIGH (ref 8–23)
CO2: 22 mmol/L (ref 22–32)
CO2: 24 mmol/L (ref 22–32)
Calcium: 8.2 mg/dL — ABNORMAL LOW (ref 8.9–10.3)
Calcium: 7.9 mg/dL — ABNORMAL LOW (ref 8.9–10.3)
Chloride: 98 mmol/L (ref 98–111)
Chloride: 99 mmol/L (ref 98–111)
Creatinine, Ser: 1.67 mg/dL — ABNORMAL HIGH (ref 0.61–1.24)
Creatinine, Ser: 1.3 mg/dL — ABNORMAL HIGH (ref 0.61–1.24)
GFR calc Af Amer: 42 mL/min — ABNORMAL LOW (ref >60)
GFR calc Af Amer: 57 mL/min — ABNORMAL LOW (ref >60)
GFR calc non Af Amer: 36 mL/min — ABNORMAL LOW (ref >60)
GFR calc non Af Amer: 49 mL/min — ABNORMAL LOW (ref >60)
Glucose, Bld: 135 mg/dL — ABNORMAL HIGH (ref 70–99)
Glucose, Bld: 128 mg/dL — ABNORMAL HIGH (ref 70–99)
Potassium: 4.7 mmol/L (ref 3.5–5.1)
Potassium: 4.4 mmol/L (ref 3.5–5.1)
Sodium: 130 mmol/L — ABNORMAL LOW (ref 135–145)
Sodium: 131 mmol/L — ABNORMAL LOW (ref 135–145)
Total Bilirubin: 1.6 mg/dL — ABNORMAL HIGH (ref 0.3–1.2)
Total Bilirubin: 1.5 mg/dL — ABNORMAL HIGH (ref 0.3–1.2)
Total Protein: 5.2 g/dL — ABNORMAL LOW (ref 6.5–8.1)
Total Protein: 5 g/dL — ABNORMAL LOW (ref 6.5–8.1)

## 2020-04-24 LAB — C-REACTIVE PROTEIN
CRP: 1.6 mg/dL — ABNORMAL HIGH (ref <1.0)
CRP: 2.1 mg/dL — ABNORMAL HIGH (ref <1.0)

## 2020-04-24 LAB — HEMOGLOBIN AND HEMATOCRIT, BLOOD
HCT: 28.2 % — ABNORMAL LOW (ref 39.0–52.0)
HCT: 24.9 % — ABNORMAL LOW (ref 39.0–52.0)
Hemoglobin: 9.7 g/dL — ABNORMAL LOW (ref 13.0–17.0)
Hemoglobin: 8.9 g/dL — ABNORMAL LOW (ref 13.0–17.0)

## 2020-04-24 LAB — HISTOPLASMA GAL'MANNAN AG SER: Histoplasma Gal'mannan Ag Ser: <0.5 (ref <0.5)

## 2020-04-24 LAB — FERRITIN
Ferritin: 298 ng/mL (ref 24–336)
Ferritin: 276 ng/mL (ref 24–336)

## 2020-04-24 LAB — LACTATE DEHYDROGENASE
LDH: 327 U/L — ABNORMAL HIGH (ref 98–192)
LDH: 337 U/L — ABNORMAL HIGH (ref 98–192)

## 2020-04-24 LAB — MAGNESIUM
Magnesium: 2.3 mg/dL (ref 1.7–2.4)
Magnesium: 2.3 mg/dL (ref 1.7–2.4)

## 2020-04-24 LAB — PHOSPHORUS
Phosphorus: 4.8 mg/dL — ABNORMAL HIGH (ref 2.5–4.6)
Phosphorus: 4.7 mg/dL — ABNORMAL HIGH (ref 2.5–4.6)

## 2020-04-24 LAB — GLUCOSE, CAPILLARY: Glucose-Capillary: 117 mg/dL — ABNORMAL HIGH (ref 70–99)

## 2020-04-24 IMAGING — CT CT ABD-PELV W/ CM
2 of 5 series · 14 of 46 positions shown, 16 images · IV contrast (APPLIED)
Comparison: CT abdomen/pelvis dated [DATE]. MR pelvis dated
[DATE]. CT chest dated [DATE].

CLINICAL DATA: Abdominal pain with distension. Retroperitoneal
hematoma. COVID pneumonia.

EXAM:
CT ABDOMEN AND PELVIS WITH CONTRAST
TECHNIQUE: Multidetector CT imaging of the abdomen and pelvis was performed
using the standard protocol following bolus administration of
intravenous contrast.
CONTRAST:  75mL OMNIPAQUE IOHEXOL 300 MG/ML  SOLN

[Series 2: routine abd/pel with · axial · 0.82mm/px · z∈[-1161,-751]mm · 11 of 92 slices shown, 13 images]
[im 5/92  soft-tissue]
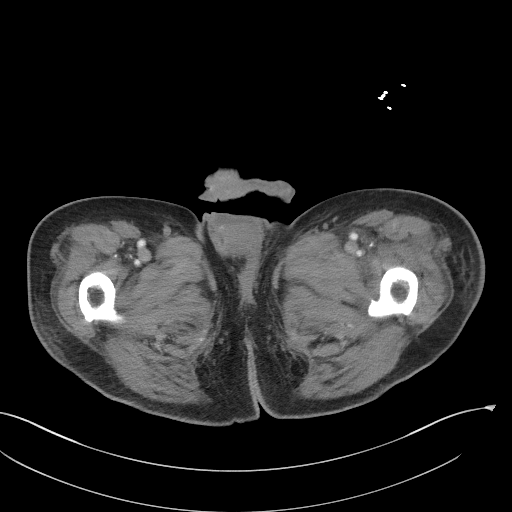
[im 5/92  bone]
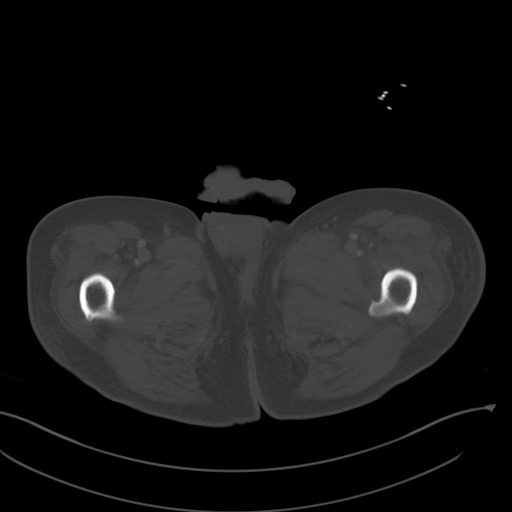
[im 14/92  soft-tissue]
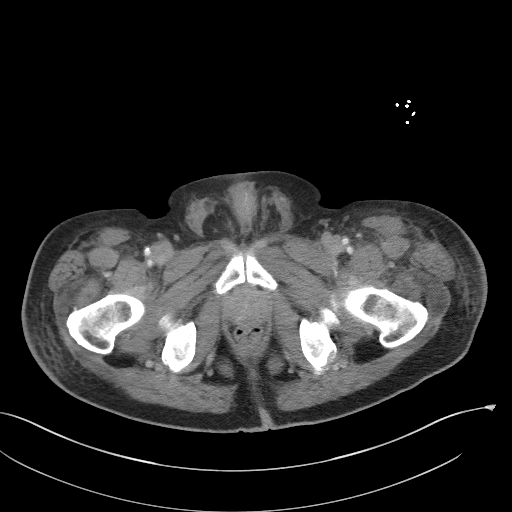
[im 23/92  soft-tissue]
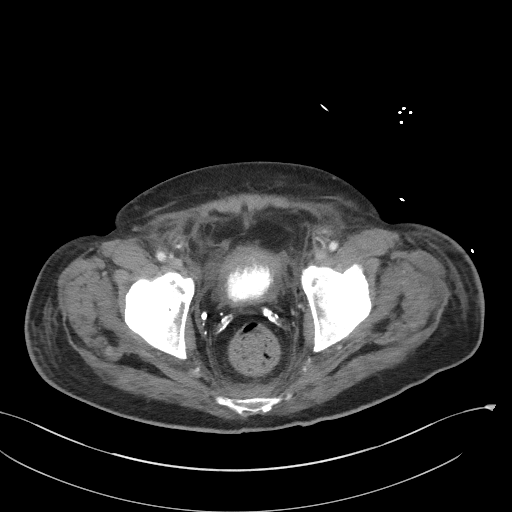
[im 32/92  soft-tissue]
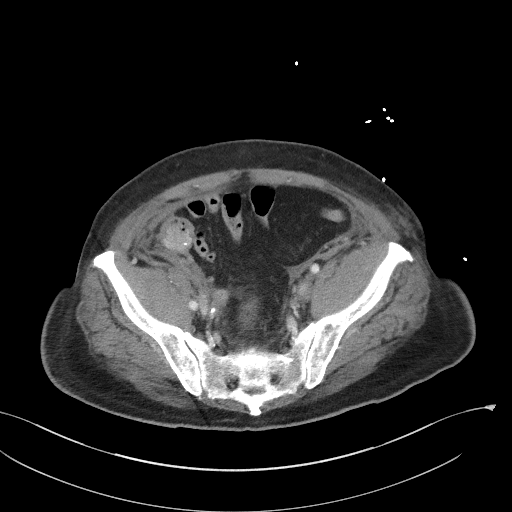
[im 37/92  soft-tissue]
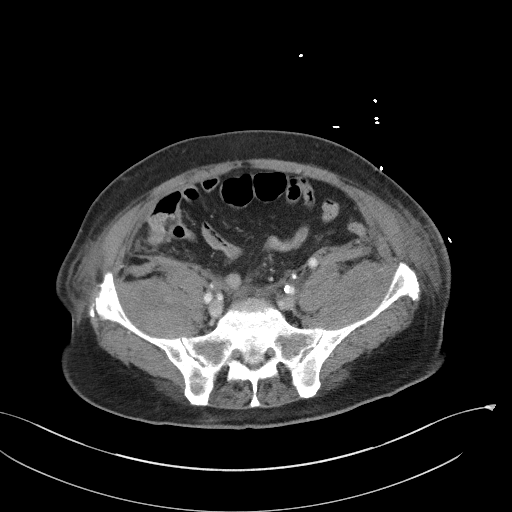
[im 46/92  soft-tissue]
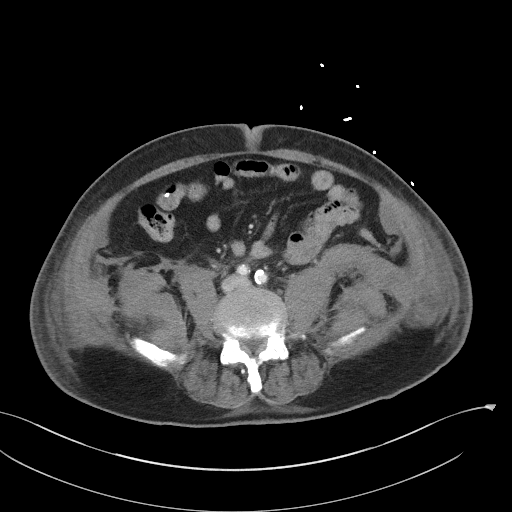
[im 55/92  soft-tissue]
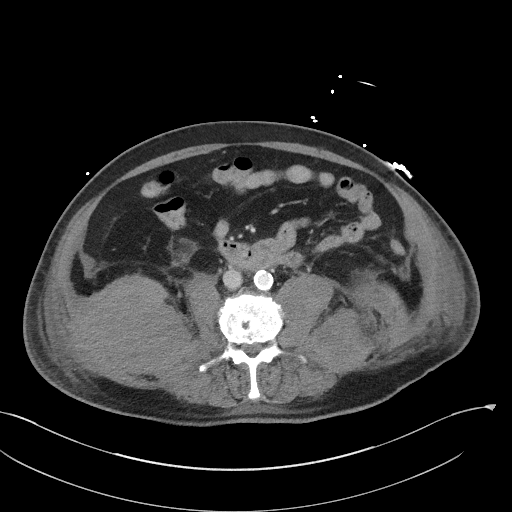
[im 60/92  soft-tissue]
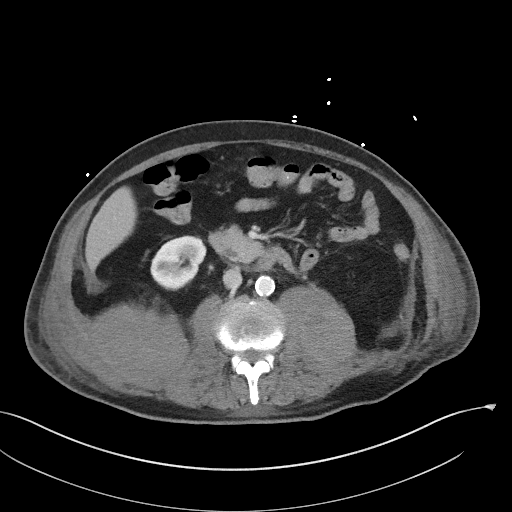
[im 69/92  soft-tissue]
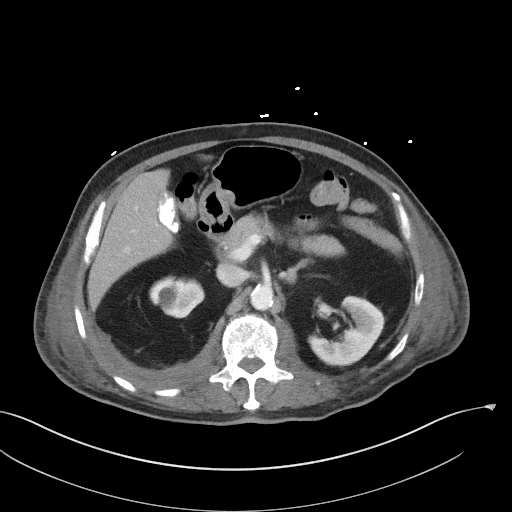
[im 69/92  bone]
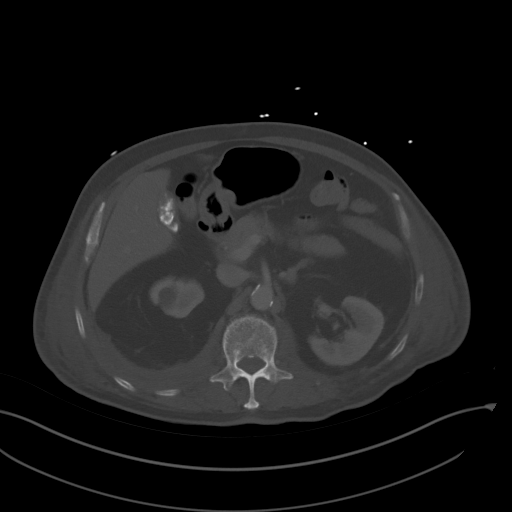
[im 78/92  soft-tissue]
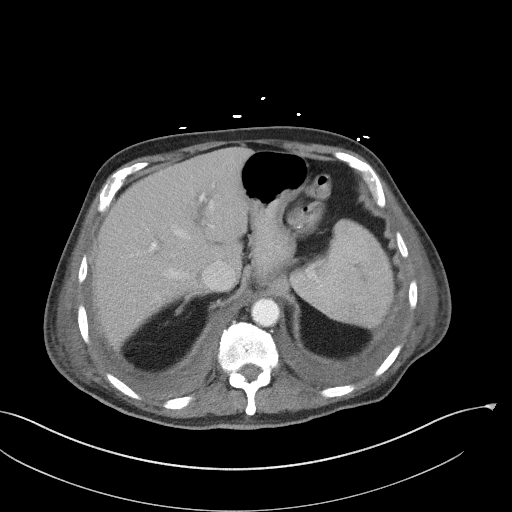
[im 87/92  soft-tissue]
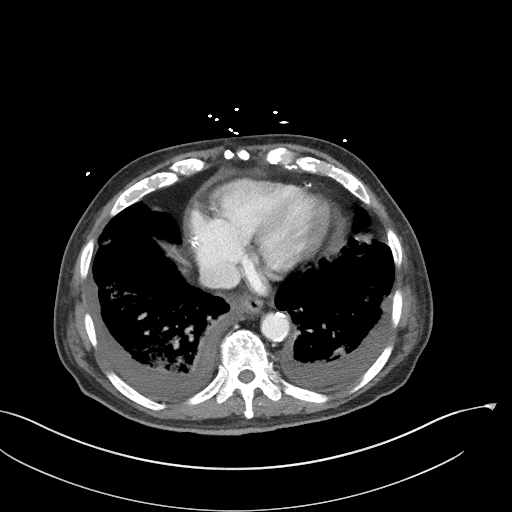

[Series 5: coronal st · coronal · 0.76mm/px · 3 of 83 slices shown]
[im 28/83  soft-tissue]
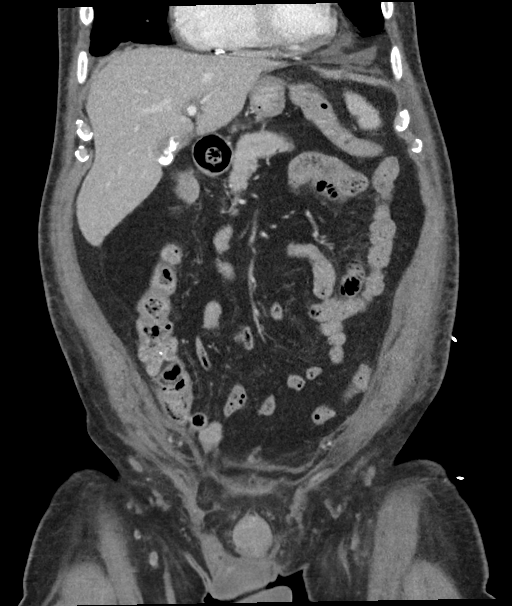
[im 37/83  soft-tissue]
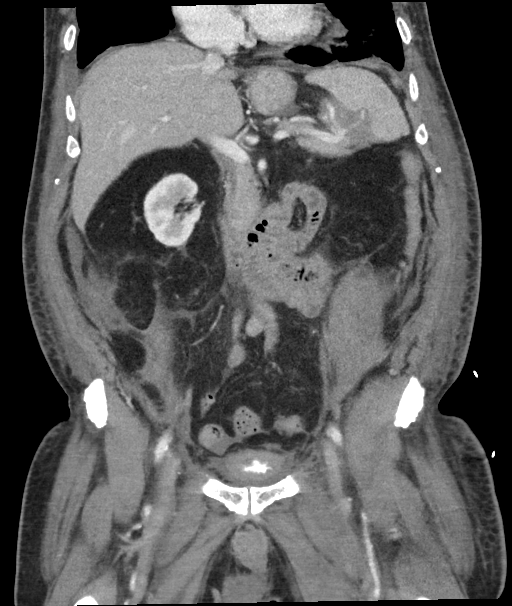
[im 46/83  soft-tissue]
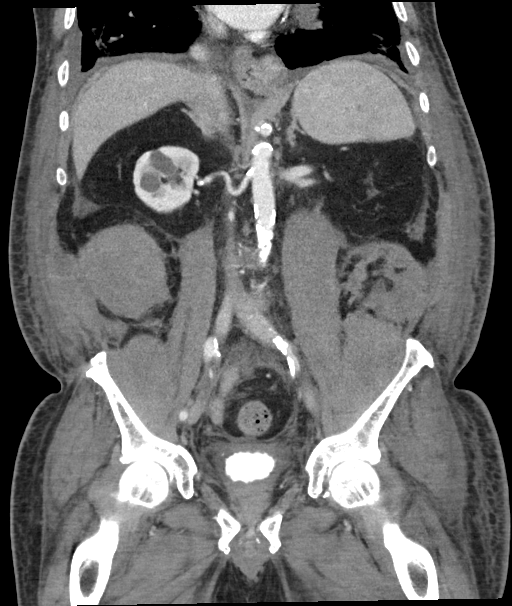

[14 of 46 positions shown; findings below may reference images not displayed]

FINDINGS: Lower chest: Subpleural patchy opacities in the bilateral lower
lobes, corresponding to the patient's known COVID pneumonia. Small
bilateral pleural effusions, unchanged. Right basilar atelectasis.

No: Bilateral upper lobes are not included on this study. However,
when correlating with the recent prior CT chest, the irregular right
upper lobe nodule is partially calcified and favors an
infectious/inflammatory etiology, possibly TB. In that context, the
left upper lobe pulmonary nodule may be related to TB, although
hamartoma or primary bronchogenic neoplasm are also possible.
Regardless, this is unlikely to be related to the patient's
presenting symptoms, although this still warrants follow-up.

Hepatobiliary: Liver is within normal limits.

Densely calcified gallstones with a contracted gallbladder. No
associated inflammatory changes. No intrahepatic or extrahepatic
duct dilatation.

Pancreas: Within normal limits, noting abnormality along the
pancreatic tail/splenic hilum, described below.

Spleen: Abnormal soft tissue in the splenic hilum which encases the
splenic vessels (coronal image 39). This measures 3.9 x 3.9 cm in
maximal axial dimension (series 2/image 19), unchanged. There is a
mildly irregular appearance centrally within the spleen (coronal
image 41), which does raise the possibility of splenic hematoma in
this clinical context, but there is no evidence of active
extravasation. Infiltrating neoplasm such as perisplenic lymphoma
could also have this appearance, but that is considered unlikely
given the additional findings.

Adrenals/Urinary Tract: Mild nodular thickening of the left adrenal
gland (series 2/image 23). Right adrenal glands are within normal
limits.

Bilateral renal cysts, including a dominant 2.3 cm cyst in the right
upper pole (series 2/image 25). No hydronephrosis.

Excretory contrast in the bladder, which is underdistended.

Stomach/Bowel: Stomach is notable for a moderate hiatal hernia.

No evidence of bowel obstruction.

Normal appendix (series 2/image 61).

Mild left colonic diverticulosis, without evidence of
diverticulitis.

Vascular/Lymphatic: No evidence of abdominal aortic aneurysm.

Atherosclerotic calcifications of the abdominal aorta and branch
vessels.

No suspicious abdominopelvic lymphadenopathy.

Reproductive: Prostate is unremarkable.

Other: Retroperitoneal hematomas involving the bilateral
iliopsoas/iliacus musculature. As noted on the prior, greatest
axillary extent is on the right, currently measuring 8.6 x 10.2 cm
(series 2/image 40), previously 8.7 x 9.9 cm, unchanged. However,
the greatest craniocaudal extent is on the left. There are a few
tiny foci of possible extravasation remaining inferiorly/posteriorly
on the right (series 2/image 45), with disbursement on delayed
imaging (series 7/image 33), although this has largely resolved
since the prior CT.

No new/progressive hemorrhage.

Musculoskeletal: Degenerative changes of the visualized
thoracolumbar spine. Stable sclerotic lesion in the posterior
spinous process at T11 (sagittal image 60).
IMPRESSION: Retroperitoneal hematomas involving the bilateral iliopsoas/iliacus
musculature, as described above, overall grossly unchanged. While a
few tiny foci of possible extravasation persist on the right, this
has largely resolved since the prior CT. No new/progressive
hemorrhage.

Suspected perisplenic hematoma, unchanged, without active
extravasation. Given this finding, and in this clinical context, the
underlying diagnosis of [DB] associated vasculitis should be
considered. Please note that infiltrating neoplasm such as
perisplenic lymphoma could also have this appearance, but is
considered unlikely given the other findings.

Bilateral lower lobe pneumonia in this patient with known [DB].
Small bilateral pleural effusions, unchanged.

Review of prior upper lobe nodules on prior chest CT raise the
possibility that this may be related to prior
infection/inflammation. Specifically, the right upper lobe nodule
favors sequela of prior TB. Left upper lobe nodule could reflect TB,
benign hamartoma, or possibly neoplasm, warranting 3 month follow-up
after resolution of symptoms.

These results were called by telephone at the time of interpretation
acknowledged these results.

## 2020-04-24 MED ORDER — IOHEXOL 300 MG/ML  SOLN
75.0000 mL | Freq: Once | INTRAMUSCULAR | Status: AC | PRN
  Administered 2020-04-24: 75 mL via INTRAVENOUS

## 2020-04-24 MED ORDER — SODIUM CHLORIDE 0.9% IV SOLUTION: Freq: Once | INTRAVENOUS | Status: AC

## 2020-04-24 MED ORDER — CHLORHEXIDINE GLUCONATE CLOTH 2 % EX PADS
6.0000 | MEDICATED_PAD | Freq: Every day | CUTANEOUS | Status: DC
  Administered 2020-04-25: 6 via TOPICAL

## 2020-04-24 MED ORDER — BENZOCAINE 10 % MT GEL
OROMUCOSAL | Status: DC | PRN
  Filled 2020-04-24: qty 9

## 2020-04-24 NOTE — Progress Notes (Signed)
Pt transferred to CCU via bed on 2L of 02 without incident.

## 2020-04-24 NOTE — Progress Notes (Signed)
PROGRESS NOTE    Clinton Gordon  [id number] DOB: [birth year] DOA: 04/17/2020 PCP: Tracie Harrier, MD     Brief Narrative:  Clinton Gordon is an 86 y.o. male with PMHx significant for COPD, HTN, CAD, BPH, essential thrombocytosis and remote history of TB who was in his usual state of health until 4 days ago when he developed malaise and cough.  Patient states 2 days ago he became short of breath and was having difficulty breathing even at rest.  Patient denies any nausea vomiting or diarrhea.  Denies abdominal pain.  Denies chest pain.  His main concern is difficulty catching his breath.  Patient admits to cough, unclear whether it is productive.  Denies fevers or chills. Patient states he has been vaccinated x2 for Covid.  ED Course:  The patient was noted to be afebrile with tachycardia.  He was somewhat hypoxic on room air but corrected to 95% with 2 L.  Chest x-ray showed multifocal pulmonary opacities consistent with multifocal pneumonia as well as a possible loculated left-sided effusion.  Covid test was positive.  Patient was started on steroids and remdesivir and admitted for ongoing    Subjective: 9/6 A/O x4, patient much more comfortable.  Understands that he almost died last night when he was pushing for early discharge.   Assessment & Plan: Covid vaccination; vaccinated   Principal Problem:   Pneumonia due to COVID-19 virus Active Problems:   Essential thrombocythemia ([place])   Hypertension   BPH without obstruction/lower urinary tract symptoms   Pulmonary hypertension ([facility])   Lower extremity weakness   Thigh pain   Iliopsoas muscle hematoma  Acute respiratory failure with hypoxia/COPD COVID-19 Labs  Recent Labs    04/22/20 0818 04/23/20 0542 04/24/20 0304  FERRITIN 271 262 298  LDH 326* 315* 327*  CRP 2.4* 1.3*  --     Lab Results  Component Value Date   SARSCOV2NAA POSITIVE (A) 04/17/2020   [place] Not Detected 07/19/2019    Covidpneumonia/COPD -Not on home O2 -Hydroxyurea 500 mg BID -Solu-Medrol40 mg BID -Remdesivirper pharmacy protocol -Combivent QID -Titrate O2 to maintain SPO2> 88% -Patient will be considered contagious until 9/21. Patient will need to take all general precautions; see below  -Schedule follow-up appointment with Clinton. Loma Sousa who evaluated patient in hospital. Patient will require biopsy and per note IGRA/quantify for now as well as other etiology including fungal. -SATURATION QUALIFICATIONS: (Thisnote is usedto comply with regulatory documentation for home oxygen) Patient Saturations on Room Air at Rest =92% Patient Saturations on Room Air while Ambulating =Please see the note below. Patient Saturations on2Liters of oxygen while Ambulating = See note below Please briefly explain why patient needs home oxygen: Pt's O2 sats at rest 93%. O2 sats dropped to 81% sitting in bed. HR increased to 116. Pt is weak and stayed in bed during assessment. 2L O2 reapplied, O2 sats up to mid 90's.  -Place patient on 2 L O2 titrate to maintain SPO2> 93% -Provide [employer] portable home O2 oxygen generator Abnormal chest CT with nodules -Patient has spiculated nodules with adenopathy on chest CT which is concerning for malignancy However patient's son Clinton Gordon states he has history of TB in the past -9/3 discussed case with critical care and Clinton Gordon.  Critical care agreed to see patient to determine when appropriate for biopsy and follow-up. -9/6 discussed case with Clinton. Alfonse Ras Radiologist who performed chest CT today see results below.  Abnormal chest CT with nodules -Patient has  spiculated nodules with adenopathy on chest CT which is concerning for malignancy However patient's sonDr Neila Gordon he has history of TB in the past -9/3 discussed case with critical care and Clinton Gordon. Critical care agreed to see patient to determine when  appropriate for biopsy and follow-up.  9/6 discussed case with Clinton. Alfonse Ras Radiologist who performed chest CT today see results below.  Work-up will have to be completed as outpatient in several weeks if patient survives hospitalization and heals up well.  Is now tied into the system with pulmonologist, see above Covid pneumonia  New onset atrial fibrillation with RVR -CHADS2VASC score=4 -Imdur 30 mg daily (held) secondary to hypotension and acute hematoma -9/5Losartan 12.5 mg daily(hold)secondary to hypotension and acute hematoma -9/4 increase Metoprolol 37.5 mg BID -9/4 Digoxin 250 mcg x 1 -9/1Digoxin 62.5 mcg daily -9/5Apixaban (held)secondary to acute hematoma -9/4 patient had been rate controlled over the past 2 days however given patient's desaturation back in A. fib with RVR. Most likely combination of desaturation, Covid pneumonia -9/5 Albumin 25 g secondary hypotension; after completion of albumin bolus552mlnormal saline -9/5 after above treatment patient's A. fib better controlled but not considered adequately controlled this was explained to Clinton Gordon and Clinton Gordon son/POA both are willing to accept risk associated with not fully controlled A. fib with RVR.  -9/6 rate controlled continue above treatment.   Pulmonary hypertension -Patient has moderate pulmonary HTN by echocardiogram see results below -See A. fib .  CAD -No chest pain, no evidence for ACS -See A. fib with RVR  HTN/Hypotension -See A-Fib -Hypotension resolved.  BPH -Continue Flomax  New onset Lower Extremity Weakness and Thigh Pain --9/4 T-spine MRI pending -9/4 L-spine MRI pending -9/4 pelvic MRI pending -9/5 CT abdomen pelvis W contrast per radiology recommendation; shows active bleeding results below.  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 Although TB can spread  into spine (Potts disease), asymptomatic presentation to have no symptoms and then have new onset symptoms. -Patient may also have primary lung neoplasm undiagnosed with mets. -Given patient's new onset lower extremity weakness and pain awaiting findings of above ordered MRI, will then discuss findings with patient's son and decide on treatment plan.  Acutebilateral psoas and iliacus muscleshematoma -9/5 hold apixaban, ASA and any other blood thinning products. -9/5 CT abdomen pelvis identifiedActive hemorrhage/contrast extravasation is identified within the RIGHT iliopsoas hematoma. See results below -9/6 CT abdomen and pelvis W contrast; appears bleeding has tamponaded at see results below, would monitor patient for the next several days.  Acute blood loss anemia with hemorrhagic shock -9/6 transfuse 2 units PRBC -Transfuse for hemoglobin<7 -Trend H/H QID -See acute bilateral psoas and iliacus muscle hematoma   Goals of care -9/5 9/4 spoke with sonsDr Irish Gordon Clinton Gordon concerning patient's current hematomas 2 of which appear to be actively bleeding, and which radiology has recommended close follow-up.  Due to family circumstances Clinton Gordon Clinton Gordon son/POA they have decided to accept the risks associated with patient being discharged from hospital.  Given that family is in the  healthcare field feel that they have extremely good grasp of the repercussions of early discharge and have weighed their decision carefully.  In addition patient has expressed his wish to be discharged regardless of.  9/6 ADDENDUM; Palliative Care Consult:Family had insisted on taking patient home yesterday when it informed that  patient was still actively bleeding, and that his A. fib with RVR was not fully controlled.  1 son is a physician, 1 son is POA after long discussion they stated that secondary to family dynamics they were willing to take the risk, understood the risk.  However  when they arrived to take patient home he had decompensated, changed her mind wanted everything done for patient refused to take him home.  Patient needs to be made DNR.  Discussion on future goals of care.  Per nighttime physician note ICU has agreed to allow family to come today in person to discuss plan of care.  Would be most helpful if palliative care was at this meeting.    DVT prophylaxis: SCD (secondary to acute bleed) Code Status: Full Family Communication: 9/5 spoke with son  Clinton Gordon concerning patient' not being discharged.  States he and his brother did not understand how ill their father was and had requested last night of a ICU physician to have a meeting today discuss options.  Also requested that mother be allowed to visit patient.  This request was passed to [facility].  I informed Mr Declan Mier most likely would not be approved however request was passed to appropriate authorities. -9/6 in addition awaiting palliative care to help arrange meeting with family Status is: Inpatient    Dispo: The patient is from: Home              Anticipated d/c is to: Home              Anticipated d/c date is: 9/5              Patient currently unstable      Consultants:  Critical care Palliative Care pending   Procedures/Significant Events:  8/30 CT chest W0 contrast;Widespread multifocal areas of mixed interstitial, ground-glass and consolidative opacities with a peripheral and basilar predominance, with a peripheral and basilar predominance. Findings are favored to represent multifocal pneumonia in the setting of COVID-19 positivity. -Bilateral spiculated masslike opacities in both upper lobes as well as more nodular intermediate to soft tissue attenuation pleural thickening in the upper lungs including a region contiguous with more diffuse conglomerate in infiltrative soft tissue attenuation along the right paramediastinal margin concerning for underlying  malignancy/metastatic disease. -Additional mediastinal nodes concerning for metastatic adenopathy. -Indeterminate nodule of the left adrenal gland measuring up to1.6 cm in size, concerning for metastatic disease. -Somewhat indeterminate sclerotic focus in the left sixth rib laterally. Additional mixed sclerotic and lucent lesion seen in the T11 spinousprocess as well. Could reflect osseous metastatic disease. -Indeterminate1 cm mass in the right renal fossa. Possible cyst from the upper pole right kidney. -Ascending aortic dilatation to 4.1 cm. Recommend annual imaging followup by CTA or MRA.  8/31 echocardiogram;Left Ventricle: LVEF= 55 to 60%.  -Left ventricular diastolic parameters are indeterminate.  -moderately elevated pulmonary artery systolic pressure. estimated right ventricular systolic pressure is 51.0 mmHg.  9/4 MRI T-spine/L-spine/pelvis;-no evidence of metastatic disease to the thoracic or lumbar spine. -. Multiple fluid collections within the psoas and iliacus muscles,RIGHT>>>LEFTconsistent with intramuscular hematomas. Dedicated imaging of this region is recommended. -. Pleural effusions. -Mild multilevel lumbar degenerative disc disease with mild spinal canal stenosis at L3-4. 9/5 CT abdomen pelvis W contrast;Hypodense collection within the splenic hilum, measuring 4.9 x 4cm, compatible with acute hematoma. Questionable focal active hemorrhage/extravasation at the upper margin of this perisplenic collection, but not convincing. Recommend short-term follow-up CT to ensure stability. -Large intramuscular hematomas  involving the bilateral iliopsoas musculature, as described on today's earlier MRI exams and not significantly changed in the short-term interval. -Active hemorrhage/contrast extravasation is identified within the RIGHT iliopsoas hematomaRecommend short-term follow-up CT to ensure resolution. -Bibasilar pleural effusions. Mixed consolidations and ground-glass  opacities at the bilateral lung bases, corresponding to the chest CT findings of 04/17/2020 suggesting multifocal pneumonia. -.Cholelithiasiswithout evidence of acute cholecystitis. -Anasarca.  9/6 CT abdomen pelvis W contrast;-retroperitoneal hematomas involving the bilateral iliopsoas/iliacus musculature, as described above, overall grossly unchanged. While a few tiny foci of possible extravasation persist on the right, this has largely resolved since the prior CT. No new/progressive hemorrhage. -Suspected perisplenic hematoma, unchanged, without active extravasation. Given this finding, and in this clinical context, the underlying diagnosis of COVID-19 associated vasculitis should be considered.  -Please note that infiltrating neoplasm such as perisplenic lymphoma could also have this appearance, but is considered unlikely given the other findings. -Bilateral lower lobe pneumonia in this patient with known COVID-19. -Small bilateral pleural effusions, unchanged. -Review of prior upper lobe nodules on prior chest CT raise the possibility that this may be related to prior infection/inflammation. Specifically, the right upper lobe nodule favors sequela of prior TB. Left upper lobe nodule could reflect TB, benign hamartoma, or possibly neoplasm, warranting 3 month follow-up after resolution of symptoms.    I have personally reviewed and interpreted all radiology studies and my findings are as above.  VENTILATOR SETTINGS: Nasal cannula 9/6 Flow 2 L/min SPO2 100%   Cultures  Antimicrobials: Anti-infectives (From admission, onward)   Start     Ordered Stop   04/18/20 1000  remdesivir 100 mg in sodium chloride 0.9 % 100 mL IVPB       "Followed by" Linked Group Details   04/17/20 1443 04/22/20 0959   04/17/20 1600  remdesivir 200 mg in sodium chloride 0.9% 250 mL IVPB       "Followed by" Linked Group Details   04/17/20 1443 04/17/20 1659   04/17/20 1230  cefTRIAXone (ROCEPHIN) 2 g in  sodium chloride 0.9 % 100 mL IVPB  Status:  Discontinued        04/17/20 1218 04/17/20 1725   04/17/20 1230  azithromycin (ZITHROMAX) 500 mg in sodium chloride 0.9 % 250 mL IVPB  Status:  Discontinued        04/17/20 1218 04/17/20 1725       Devices    LINES / TUBES:      Continuous Infusions:  sodium chloride       Objective: Vitals:   04/24/20 0600 04/24/20 0630 04/24/20 0700 04/24/20 0750  BP: (!) 109/51 (!) 116/58 113/74 120/72  Pulse: 91 89 94 85  Resp: 18 (!) 21 (!) 21 20  Temp:    (!) 95.6 F (35.3 C)  TempSrc:    Oral  SpO2: 100% 100% 100% 100%  Weight:      Height:        Intake/Output Summary (Last 24 hours) at 04/24/2020 0759 Last data filed at 04/24/2020 0750 Gross per 24 hour  Intake 2121.35 ml  Output 400 ml  Net 1721.35 ml   Filed Weights   04/17/20 1017 04/17/20 1915  Weight: 51.7 kg 51.7 kg   Physical Exam:  General: A/O x4, positive acute respiratory distress Eyes: negative scleral hemorrhage, negative anisocoria, negative icterus ENT: Negative Runny nose, negative gingival bleeding, Neck:  Negative scars, masses, torticollis, lymphadenopathy, JVD Lungs: decreased breath sounds bilaterally without wheezes or crackles Cardiovascular: Irregular irregular rhythm and rate without murmur gallop  or rub normal S1 and S2 Abdomen: Positive bilateral abdominal pain lateral aspect of abdomen consistent with iliopsoas hematoma.  Pain right upper quadrant consistent with hematoma at splenic flexure, nondistended, positive soft, bowel sounds, no rebound, no ascites, no appreciable mass Extremities: No significant cyanosis, clubbing, or edema bilateral lower extremities Skin: Negative rashes, lesions, ulcers Psychiatric:  Negative depression, negative anxiety, negative fatigue, negative mania  Central nervous system:  Cranial nerves II through XII intact, tongue/uvula midline, all extremities muscle strength 5/5, sensation intact throughout, negative  dysarthria, negative expressive aphasia, negative receptive aphasia.  .     Data Reviewed: Care during the described time interval was provided by me .  I have reviewed this patient's available data, including medical history, events of note, physical examination, and all test results as part of my evaluation.  CBC: Recent Labs  Lab 04/20/20 0450 04/20/20 0450 04/21/20 0736 04/22/20 0818 04/23/20 0542 04/23/20 2234 04/24/20 0304  WBC 17.0*   < > 17.4* 29.2* 28.9* 31.8* 29.3*  NEUTROABS 14.0*  --  14.4* 22.5* 21.5*  --  22.0*  HGB 12.5*   < > 14.1 10.6* 8.0* 6.0* 5.8*  HCT 36.7*   < > 40.0 31.0* 22.6* 17.1* 17.1*  MCV 93.9   < > 91.5 93.9 92.2 92.9 95.5  PLT 605*   < > 703* 887* 843* 890* 793*   < > = values in this interval not displayed.   Basic Metabolic Panel: Recent Labs  Lab 04/20/20 0450 04/21/20 0736 04/22/20 0818 04/23/20 0542 04/24/20 0304  NA 131* 131* 130* 131* 130*  K 4.4 4.3 4.1 4.6 4.7  CL 100 98 98 100 98  CO2 25 23 25 23 22   GLUCOSE 106* 130* 154* 151* 135*  BUN 30* 25* 34* 50* 67*  CREATININE 0.80 0.83 0.86 1.48* 1.67*  CALCIUM 8.3* 8.6* 7.9* 7.7* 8.2*  MG 1.9 2.0 2.1 2.3 2.3  PHOS 3.4 3.2 3.9 4.9* 4.8*   GFR: Estimated Creatinine Clearance: 22.8 mL/min (A) (by C-G formula based on SCr of 1.67 mg/dL (H)). Liver Function Tests: Recent Labs  Lab 04/20/20 0450 04/21/20 0736 04/22/20 0818 04/23/20 0542 04/24/20 0304  AST 33 30 26 29  41  ALT 24 25 22 20 25   ALKPHOS 75 84 65 53 54  BILITOT 1.3* 1.4* 1.2 1.2 1.6*  PROT 5.8* 6.3* 5.1* 4.6* 5.2*  ALBUMIN 2.6* 2.9* 2.6* 2.4* 3.0*   No results for input(s): LIPASE, AMYLASE in the last 168 hours. No results for input(s): AMMONIA in the last 168 hours. Coagulation Profile: Recent Labs  Lab 04/17/20 1908  INR 1.2   Cardiac Enzymes: No results for input(s): CKTOTAL, CKMB, CKMBINDEX, TROPONINI in the last 168 hours. BNP (last 3 results) No results for input(s): PROBNP in the last 8760  hours. HbA1C: No results for input(s): HGBA1C in the last 72 hours. CBG: Recent Labs  Lab 04/24/20 0139  GLUCAP 117*   Lipid Profile: No results for input(s): CHOL, HDL, LDLCALC, TRIG, CHOLHDL, LDLDIRECT in the last 72 hours. Thyroid Function Tests: No results for input(s): TSH, T4TOTAL, FREET4, T3FREE, THYROIDAB in the last 72 hours. Anemia Panel: Recent Labs    04/23/20 0542 04/24/20 0304  FERRITIN 262 298   Sepsis Labs: Recent Labs  Lab 04/17/20 1305 04/17/20 1459  PROCALCITON  --  <0.10  LATICACIDVEN 1.5  --     Recent Results (from the past 240 hour(s))  SARS Coronavirus 2 by RT PCR (hospital order, performed in [facility] hospital lab) Nasopharyngeal  Nasopharyngeal Swab     Status: Abnormal   Collection Time: 04/17/20 12:02 PM   Specimen: Nasopharyngeal Swab  Result Value Ref Range Status   SARS Coronavirus 2 POSITIVE (A) NEGATIVE Final    Comment: RESULT CALLED TO, READ BACK BY AND VERIFIED WITH: [place] 1308 04/17/2020 DB (NOTE) SARS-CoV-2 target nucleic acids are DETECTED  SARS-CoV-2 RNA is generally detectable in upper respiratory specimens  during the acute phase of infection.  Positive results are indicative  of the presence of the identified virus, but do not rule out bacterial infection or co-infection with other pathogens not detected by the test.  Clinical correlation with patient history and  other diagnostic information is necessary to determine patient infection status.  The expected result is negative.  Fact Sheet for Patients:   StrictlyIdeas.no   Fact Sheet for Healthcare Providers:   BankingDealers.co.za    This test is not yet approved or cleared by the [place] FDA and  has been authorized for detection and/or diagnosis of SARS-CoV-2 by FDA under an Emergency Use Authorization (EUA).  This EUA will remain in effect (meaning this test  can be used) for the duration of  the  COVID-19 declaration under Section 564(b)(1) of the Act, 21 U.S.C. section 360-bbb-3(b)(1), unless the authorization is terminated or revoked sooner.  Performed at [facility], [address] [place], [place] [address]   Blood Culture (routine x 2)     Status: None   Collection Time: 04/17/20 12:02 PM   Specimen: BLOOD  Result Value Ref Range Status   Specimen Description BLOOD LEFT ARM  Final   Special Requests   Final    BOTTLES DRAWN AEROBIC AND ANAEROBIC Blood Culture adequate volume   Culture   Final    NO GROWTH 5 DAYS Performed at [facility], [address], [place], [place] [address]    Report Status 04/22/2020 FINAL  Final  Urine culture     Status: None   Collection Time: 04/17/20  1:05 PM   Specimen: In/Out Cath Urine  Result Value Ref Range Status   Specimen Description   Final    IN/OUT CATH URINE Performed at [facility], [address] [place], [place] [address]    Special Requests   Final    NONE Performed at [facility], [address] [place], [place] [address]    Culture   Final    NO GROWTH Performed at [facility], [place] [address] [place], [place] [address]    Report Status 04/18/2020 FINAL  Final  Blood Culture (routine x 2)     Status: None   Collection Time: 04/17/20  7:08 PM   Specimen: BLOOD  Result Value Ref Range Status   Specimen Description BLOOD RIGHT ANTECUBITAL  Final   Special Requests   Final    BOTTLES DRAWN AEROBIC AND ANAEROBIC Blood Culture adequate volume   Culture   Final    NO GROWTH 5 DAYS Performed at [facility], [address] [place],  [address]    Report Status 04/22/2020 FINAL  Final         Radiology Studies: MR THORACIC SPINE W WO CONTRAST  Result Date: 04/23/2020 CLINICAL DATA:  Cough and shortness of breath. Spiculated nodules in adenopathy on chest CT. EXAM: MRI THORACIC AND LUMBAR SPINE WITHOUT AND WITH CONTRAST TECHNIQUE: Multiplanar  and multiecho pulse sequences of the thoracic and lumbar spine were obtained without and with intravenous contrast. CONTRAST:  7.34mL GADAVIST GADOBUTROL 1 MMOL/ML IV SOLN COMPARISON:  Chest CT 04/17/2020 FINDINGS: MRI THORACIC SPINE FINDINGS Alignment:  Normal Vertebrae: Sclerotic focus in the T11 spinous process without contrast enhancement. Bone marrow signal otherwise normal. Cord:  Normal signal and morphology. Paraspinal and other soft tissues: Pleural effusions with right medial loculation. Incompletely visualized spiculated areas of distortion in the right upper lobe. Disc levels: No spinal canal or neural foraminal stenosis. MRI LUMBAR SPINE FINDINGS Segmentation:  Standard. Alignment:  Physiologic. Vertebrae:  No fracture, evidence of discitis, or bone lesion. Conus medullaris: Extends to the L1 level and appears normal. Paraspinal and other soft tissues: Multiple fluid collections within the psoas and iliacus muscles, right greater than left. Disc levels: L1-2: Disc desiccation without stenosis. L2-3: Mild disc bulge without spinal canal stenosis. Mild bilateral foraminal narrowing. L3-4: Left asymmetric intermediate sized disc bulge. Mild spinal canal stenosis and mild left foraminal stenosis. L4-5: Intermediate disc bulge with no spinal canal stenosis. Mild right foraminal stenosis. L5-S1: Mild disc bulge and mild facet hypertrophy. No spinal canal stenosis. No neural foraminal stenosis. IMPRESSION: 1. No evidence of metastatic disease to the thoracic or lumbar spine. 2. Multiple fluid collections within the psoas and iliacus muscles, right greater than left, consistent with intramuscular hematomas. Dedicated imaging of this region is recommended. 3. Pleural effusions. 4. Mild multilevel lumbar degenerative disc disease with mild spinal canal stenosis at L3-4. Electronically Signed   By: Ulyses Jarred M.D.   On: 04/23/2020 02:08   MR Lumbar Spine W Wo Contrast  Result Date: 04/23/2020 : CLINICAL DATA:  Cough and shortness of breath. Spiculated nodules in adenopathy on chest CT. EXAM: MRI THORACIC AND LUMBAR SPINE WITHOUT AND WITH CONTRAST TECHNIQUE: Multiplanar and multiecho pulse sequences of the thoracic and lumbar spine were obtained without and with intravenous contrast. CONTRAST: 7.37mL GADAVIST GADOBUTROL 1 MMOL/ML IV SOLN COMPARISON: Chest CT 04/17/2020 FINDINGS: MRI THORACIC SPINE FINDINGS Alignment: Normal Vertebrae: Sclerotic focus in the T11 spinous process without contrast enhancement. Bone marrow signal otherwise normal. Cord: Normal signal and morphology. Paraspinal and other soft tissues: Pleural effusions with right medial loculation. Incompletely visualized spiculated areas of distortion in the right upper lobe. Disc levels: No spinal canal or neural foraminal stenosis. MRI LUMBAR SPINE FINDINGS Segmentation: Standard. Alignment: Physiologic. Vertebrae: No fracture, evidence of discitis, or bone lesion. Conus medullaris: Extends to the L1 level and appears normal. Paraspinal and other soft tissues: Multiple fluid collections within the psoas and iliacus muscles, right greater than left. Disc levels: L1-2: Disc desiccation without stenosis. L2-3: Mild disc bulge without spinal canal stenosis. Mild bilateral foraminal narrowing. L3-4: Left asymmetric intermediate sized disc bulge. Mild spinal canal stenosis and mild left foraminal stenosis. L4-5: Intermediate disc bulge with no spinal canal stenosis. Mild right foraminal stenosis. L5-S1: Mild disc bulge and mild facet hypertrophy. No spinal canal stenosis. No neural foraminal stenosis. IMPRESSION: 1. No evidence of metastatic disease to the thoracic or lumbar spine. 2. Multiple fluid collections within the psoas and iliacus muscles, right greater than left, consistent with intramuscular hematomas. Dedicated imaging of this region is recommended. 3. Pleural effusions. 4. Mild multilevel lumbar degenerative disc disease with mild spinal canal stenosis  at L3-4. Electronically Signed   By: Ulyses Jarred M.D.   On: 04/23/2020 02:12   MR PELVIS W WO CONTRAST  Result Date: 04/23/2020 CLINICAL DATA:  Thrombocytosis, malaise, cough, possible thoracic malignancy EXAM: MRI PELVIS WITHOUT AND WITH CONTRAST TECHNIQUE: Multiplanar multisequence MR imaging of the pelvis was performed both before  and after administration of intravenous contrast. CONTRAST:  7.13mL GADAVIST GADOBUTROL 1 MMOL/ML IV SOLN COMPARISON:  CT abdomen/pelvis dated 09/20/2017 FINDINGS: Urinary Tract:  Bladder is within normal limits. Bowel:  Visualized bowel is grossly unremarkable. Vascular/Lymphatic: No evidence of aneurysm. No suspicious pelvic lymphadenopathy. Reproductive:  Prostate is unremarkable. Other: Intramuscular hematomas within the bilateral psoas and iliacus muscles, right greater than left, incompletely visualized. Largest axial measurement 7.2 x 7.4 cm on the right. Associated layering retroperitoneal fluid bilaterally. Layering fluid-fluid level in the dependent pelvis. Musculoskeletal: Body wall edema.  No focal osseous lesions. IMPRESSION: Intramuscular hematomas within the bilateral psoas and iliacus muscles, right greater than left, incompletely visualized. Correlate with laboratory evaluation if patient is anticoagulated. Electronically Signed   By: Julian Hy M.D.   On: 04/23/2020 04:18   CT ABDOMEN PELVIS W CONTRAST  Result Date: 04/23/2020 CLINICAL DATA:  Retroperitoneal hematoma, follow-up. Abdominal pain with distension. COVID positive. EXAM: CT ABDOMEN AND PELVIS WITH CONTRAST TECHNIQUE: Multidetector CT imaging of the abdomen and pelvis was performed using the standard protocol following bolus administration of intravenous contrast. CONTRAST:  167mL OMNIPAQUE IOHEXOL 300 MG/ML  SOLN COMPARISON:  CT abdomen dated 08/20/2017. MRI lumbar spine from earlier same day. FINDINGS: Lower chest: Bibasilar pleural effusions. Mixed consolidations and ground-glass opacities at  the bilateral lung bases, corresponding to the chest CT findings of 04/17/2020 suggesting multifocal pneumonia. Hepatobiliary: Gallbladder is filled with stones. No pericholecystic inflammation or other signs of acute cholecystitis. Common bile duct appears stable in caliber compared to the earlier CT of 08/20/2017. No focal mass or lesion is seen within the liver. Pancreas: Unremarkable. No pancreatic ductal dilatation or surrounding inflammatory changes. Spleen: Hypodense collection within the splenic hilum, measuring 4.9 x 4 cm, most likely acute blood products, suspected active hemorrhage/extravasation at the upper margin of the collection (anterior margin of the spleen). Adrenals/Urinary Tract: Adrenal glands appear normal. Bilateral renal cysts. No renal stone or hydronephrosis. Bladder is partially decompressed, containing hyperdense material which most likely represents IV contrast excretion. Stomach/Bowel: No dilated large or small bowel loops. No evidence of bowel wall inflammation. Stomach is unremarkable, partially decompressed. Vascular/Lymphatic: Aortic atherosclerosis. No enlarged lymph nodes seen. Reproductive: Prostate is unremarkable. Other: Complex mixed-density collections are present within the bilateral iliopsoas musculature, compatible with the intramuscular hematomas described on earlier MRI exams, RIGHT perhaps slightly greater overall size/thickness than on the LEFT, but more craniocaudal extension within the LEFT iliac muscle to the LEFT upper thigh. These hematomas have not significantly changed in the short-term interval. Small amount of free fluid in the pelvis. Musculoskeletal: No acute appearing osseous abnormality. Ill-defined fluid/edema within the subcutaneous soft tissues indicating anasarca. IMPRESSION: 1. Hypodense collection within the splenic hilum, measuring 4.9 x 4 cm, compatible with acute hematoma. Questionable focal active hemorrhage/extravasation at the upper margin of  this perisplenic collection, but not convincing. Recommend short-term follow-up CT to ensure stability. 2. Large intramuscular hematomas involving the bilateral iliopsoas musculature, as described on today's earlier MRI exams and not significantly changed in the short-term interval. 3. Active hemorrhage/contrast extravasation is identified within the RIGHT iliopsoas hematoma (series 2, images 42 through 45; series 7, images 28 and 29). Recommend short-term follow-up CT to ensure resolution. 4. Bibasilar pleural effusions. Mixed consolidations and ground-glass opacities at the bilateral lung bases, corresponding to the chest CT findings of 04/17/2020 suggesting multifocal pneumonia. 5. Cholelithiasis without evidence of acute cholecystitis. 6. Small amount of free fluid in the pelvis. 7. Anasarca. Aortic Atherosclerosis (ICD10-I70.0). These results and recommendations were  called by telephone at the time of interpretation on 04/23/2020 at 2:31 pm to provider [place]. Sherral Hammers, who verbally acknowledged these results. Electronically Signed   By: Franki Cabot M.D.   On: 04/23/2020 14:39   DG Chest Port 1 View  Result Date: 04/22/2020 CLINICAL DATA:  Unresponsive this morning.  COVID positive EXAM: PORTABLE CHEST 1 VIEW COMPARISON:  Five days ago FINDINGS: Patchy bilateral pneumonia with similar distribution but likely decreasing density. Normal heart size. 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 No visible effusion or air leak. Hyperinflation in the setting of COPD. Calcification and pleural thickening asymmetric at the right apex. IMPRESSION: Bilateral pneumonia with mildly improved aeration. Reference recent chest CT concerning the findings of possible intrathoracic malignancy. Electronically Signed   By: Monte Fantasia M.D.   On: 04/22/2020 09:09        Scheduled Meds:  atorvastatin  20 mg Oral Daily   Chlorhexidine Gluconate Cloth  6 each Topical Daily   digoxin  0.0625 mg Oral Daily   hydroxyurea  500 mg Oral BID    Ipratropium-Albuterol  1 puff Inhalation Q6H   isosorbide mononitrate  30 mg Oral Daily   methylPREDNISolone (SOLU-MEDROL) injection  40 mg Intravenous Q12H   metoprolol tartrate  37.5 mg Oral BID   pantoprazole  40 mg Oral BID   sodium chloride flush  3 mL Intravenous Q12H   tamsulosin  0.4 mg Oral QPC supper   Continuous Infusions:  sodium chloride       LOS: 7 days    Time spent:40 min    Dreya Buhrman, Geraldo Docker, MD [facility] Pager [phone]  If 7PM-7AM, please contact night-coverage [email or web address] Password [email or web address] 04/24/2020, 7:59 AM

## 2020-04-24 NOTE — Progress Notes (Signed)
CRITICAL VALUE ALERT  Critical Value:  Hemoglobin is 6  Date & Time Notied:  04/24/20 0020  Provider Notified: Stark Klein NP  Orders Received/Actions taken: Stark Klein states she will transfuse.

## 2020-04-24 NOTE — Progress Notes (Addendum)
    BRIEF OVERNIGHT PROGRESS REPORT   SUBJECTIVE: Per nursing staff.  Patient was transferred from bed to wheelchair to be discharged home when he suddenly became unresponsive.  Per staff there were no associated symptoms preceding the syncope of nausea vomiting, complaint of shortness of breath, palpitations or chest pain.  He was noted to be hypertensive with blood pressure in the low 64B systolic.  Patient also lost control of his blood.  No seizure-like activity noted.  Episode lasted few minutes before returning to baseline.  Patient was not postictal.  OBJECTIVE: On arrival to the bedside, patient was diaphoretic, afebrile with blood pressure 95/52 mm Hg and pulse rate 111 beats/min. There were no focal neurological deficits; he was alert but unable to communicate his needs due to language barrier.  On exam, abdomen was distended.  ASSESSMENT AND PLAN:  Acute blood loss anemia with hemorrhagic shock likely secondary to active hemorrhage within the right iliopsoas hematoma noted on prior CT abdomen and pelvis -Transfer to stepdown -Stat CBC revealed significant drop in hemoglobin to 5.8 -S/p 1 PRBC transfusion - IVF resuscitation to maintain MAP>65 - H&H monitoring q6h - Blood Consent. Transfuse PRN Hgb<7 (Massive Transfusion Protocol option if needing more than 2-4 units PRBC) - Hold NSAIDs, steroids, ASA, anticoagulations. - Plan to repeat CT abdomen pelvis with contrast this a.m.  Syncope and collapse - Etiology likely due to severe anemia as above -Have low suspicions for seizures, loss of bladder control may be vagal response sudden drop in BP with change in position -Treat underlying cause as above    Goals of care: I had an extensive discussion with patient's sonsDr Irish Elders Mr. Duell Holdren [facility]) regarding goals of care.  Per patient's son, they would like to proceed with aggressive treatment for now but would not like patient to be intubated should patient decompensate.   They have requested  a face-to-face conference possibility with general surgery as well to explore options and treatment plan.  They insist on coming to see patient to give them a better insight on his condition and help them make sound an informed decision regarding his care.  I have discussed this with the ICU staff who agrees that they can have patient's family come to the ICU waiting room pending approval from the attending  this morning.     Rufina Falco, BSN, MSN, DNP, [employer]  [facility] Nurse Practitioner  [facility]

## 2020-04-25 ENCOUNTER — Encounter: Payer: Self-pay | Admitting: Internal Medicine

## 2020-04-25 DIAGNOSIS — Z7189 Other specified counseling: Secondary | ICD-10-CM

## 2020-04-25 DIAGNOSIS — Z515 Encounter for palliative care: Secondary | ICD-10-CM

## 2020-04-25 DIAGNOSIS — U071 COVID-19: Secondary | ICD-10-CM | POA: Diagnosis not present

## 2020-04-25 DIAGNOSIS — D473 Essential (hemorrhagic) thrombocythemia: Secondary | ICD-10-CM | POA: Diagnosis not present

## 2020-04-25 DIAGNOSIS — N400 Enlarged prostate without lower urinary tract symptoms: Secondary | ICD-10-CM | POA: Diagnosis not present

## 2020-04-25 DIAGNOSIS — A419 Sepsis, unspecified organism: Secondary | ICD-10-CM | POA: Diagnosis not present

## 2020-04-25 DIAGNOSIS — J189 Pneumonia, unspecified organism: Secondary | ICD-10-CM | POA: Diagnosis not present

## 2020-04-25 DIAGNOSIS — R0602 Shortness of breath: Secondary | ICD-10-CM | POA: Diagnosis not present

## 2020-04-25 DIAGNOSIS — J9601 Acute respiratory failure with hypoxia: Secondary | ICD-10-CM

## 2020-04-25 LAB — CBC WITH DIFFERENTIAL/PLATELET
Abs Immature Granulocytes: 1.71 10*3/uL — ABNORMAL HIGH (ref 0.00–0.07)
Basophils Absolute: 0.1 10*3/uL (ref 0.0–0.1)
Basophils Relative: 0 %
Eosinophils Absolute: 0 10*3/uL (ref 0.0–0.5)
Eosinophils Relative: 0 %
HCT: 25.6 % — ABNORMAL LOW (ref 39.0–52.0)
Hemoglobin: 9.3 g/dL — ABNORMAL LOW (ref 13.0–17.0)
Immature Granulocytes: 7 %
Lymphocytes Relative: 7 %
Lymphs Abs: 1.7 10*3/uL (ref 0.7–4.0)
MCH: 32.5 pg (ref 26.0–34.0)
MCHC: 36.3 g/dL — ABNORMAL HIGH (ref 30.0–36.0)
MCV: 89.5 fL (ref 80.0–100.0)
Monocytes Absolute: 1.6 10*3/uL — ABNORMAL HIGH (ref 0.1–1.0)
Monocytes Relative: 6 %
Neutro Abs: 20.8 10*3/uL — ABNORMAL HIGH (ref 1.7–7.7)
Neutrophils Relative %: 80 %
Platelets: 618 10*3/uL — ABNORMAL HIGH (ref 150–400)
RBC: 2.86 MIL/uL — ABNORMAL LOW (ref 4.22–5.81)
RDW: 22.4 % — ABNORMAL HIGH (ref 11.5–15.5)
Smear Review: NORMAL
WBC: 25.8 10*3/uL — ABNORMAL HIGH (ref 4.0–10.5)
nRBC: 2.6 % — ABNORMAL HIGH (ref 0.0–0.2)

## 2020-04-25 LAB — COMPREHENSIVE METABOLIC PANEL
ALT: 27 U/L (ref 0–44)
AST: 37 U/L (ref 15–41)
Albumin: 2.8 g/dL — ABNORMAL LOW (ref 3.5–5.0)
Alkaline Phosphatase: 54 U/L (ref 38–126)
Anion gap: 8 (ref 5–15)
BUN: 46 mg/dL — ABNORMAL HIGH (ref 8–23)
CO2: 26 mmol/L (ref 22–32)
Calcium: 8.2 mg/dL — ABNORMAL LOW (ref 8.9–10.3)
Chloride: 100 mmol/L (ref 98–111)
Creatinine, Ser: 0.93 mg/dL (ref 0.61–1.24)
GFR calc Af Amer: >60 mL/min (ref >60)
GFR calc non Af Amer: >60 mL/min (ref >60)
Glucose, Bld: 142 mg/dL — ABNORMAL HIGH (ref 70–99)
Potassium: 4.6 mmol/L (ref 3.5–5.1)
Sodium: 134 mmol/L — ABNORMAL LOW (ref 135–145)
Total Bilirubin: 2.1 mg/dL — ABNORMAL HIGH (ref 0.3–1.2)
Total Protein: 5.2 g/dL — ABNORMAL LOW (ref 6.5–8.1)

## 2020-04-25 LAB — C-REACTIVE PROTEIN
CRP: 1.8 mg/dL — ABNORMAL HIGH (ref <1.0)
CRP: 1.8 mg/dL — ABNORMAL HIGH (ref <1.0)

## 2020-04-25 LAB — FIBRIN DERIVATIVES D-DIMER (ARMC ONLY): Fibrin derivatives D-dimer (ARMC): 852.59 ng/mL (FEU) — ABNORMAL HIGH (ref 0.00–499.00)

## 2020-04-25 LAB — LACTATE DEHYDROGENASE: LDH: 367 U/L — ABNORMAL HIGH (ref 98–192)

## 2020-04-25 LAB — FERRITIN: Ferritin: 303 ng/mL (ref 24–336)

## 2020-04-25 LAB — MAGNESIUM: Magnesium: 2.6 mg/dL — ABNORMAL HIGH (ref 1.7–2.4)

## 2020-04-25 LAB — PHOSPHORUS: Phosphorus: 3.6 mg/dL (ref 2.5–4.6)

## 2020-04-25 NOTE — Progress Notes (Addendum)
PROGRESS NOTE    Clinton Gordon  [id number] DOB: 1929/11/21 DOA: 04/17/2020 PCP: Tracie Harrier, MD     Brief Narrative:  Clinton Gordon is an 86 y.o. male with PMHx significant for COPD, HTN, CAD, BPH, essential thrombocytosis and remote history of TB who was in his usual state of health until 4 days ago when he developed malaise and cough.  Patient states 2 days ago he became short of breath and was having difficulty breathing even at rest.  Patient denies any nausea vomiting or diarrhea.  Denies abdominal pain.  Denies chest pain.  His main concern is difficulty catching his breath.  Patient admits to cough, unclear whether it is productive.  Denies fevers or chills. Patient states he has been vaccinated x2 for Covid.  ED Course:  The patient was noted to be afebrile with tachycardia.  He was somewhat hypoxic on room air but corrected to 95% with 2 L.  Chest x-ray showed multifocal pulmonary opacities consistent with multifocal pneumonia as well as a possible loculated left-sided effusion.  Covid test was positive.  Patient was started on steroids and remdesivir and admitted for ongoing    Subjective: 9/7 afebrile overnight A/O x4, patient much more comfortable.  RN states patient not eating much drinks some tea for breakfast.   Assessment & Plan: Covid vaccination; vaccinated   Principal Problem:   Pneumonia due to COVID-19 virus Active Problems:   Essential thrombocythemia ([place])   Hypertension   BPH without obstruction/lower urinary tract symptoms   Pulmonary hypertension (HCC)   Lower extremity weakness   Thigh pain   Iliopsoas muscle hematoma  Acute respiratory failure with hypoxia/COPD COVID-19 Labs  Recent Labs    04/23/20 0542 04/24/20 0304 04/24/20 0957  FERRITIN 262 298 276  LDH 315* 327* 337*  CRP 1.3* 1.6* 2.1*    Lab Results  Component Value Date   SARSCOV2NAA POSITIVE (A) 04/17/2020   [place] Not Detected 07/19/2019    Covidpneumonia/COPD -Not on home O2 -Hydroxyurea 500 mg BID -Solu-Medrol40 mg BID -Remdesivirper pharmacy protocol -Combivent QID -Titrate O2 to maintain SPO2> 88% -Patient will be considered contagious until 9/21. Patient will need to take all general precautions; see below  -Schedule follow-up appointment with Dr. Loma Sousa who evaluated patient in hospital. Patient will require biopsy and per note IGRA/quantify for now as well as other etiology including fungal. -SATURATION QUALIFICATIONS: (Thisnote is usedto comply with regulatory documentation for home oxygen) Patient Saturations on Room Air at Rest =92% Patient Saturations on Room Air while Ambulating =Please see the note below. Patient Saturations on2Liters of oxygen while Ambulating = See note below Please briefly explain why patient needs home oxygen: Pt's O2 sats at rest 93%. O2 sats dropped to 81% sitting in bed. HR increased to 116. Pt is weak and stayed in bed during assessment. 2L O2 reapplied, O2 sats up to mid 90's.  -Place patient on 2 L O2 titrate to maintain SPO2> 93% -Provide [employer] portable home O2 oxygen generator  Abnormal chest CT with nodules -Patient has spiculated nodules with adenopathy on chest CT which is concerning for malignancy However patient's son Dr [name] states he has history of TB in the past -9/3 discussed case with critical care and Dr [name].  Critical care agreed to see patient to determine when appropriate for biopsy and follow-up. -9/6 discussed case with Dr. Alfonse Ras Radiologist who performed chest CT today see results below.  New onset atrial fibrillation with RVR -CHADS2VASC score=4 -Imdur  30 mg daily (held) secondary to hypotension and acute hematoma -9/5Losartan 12.5 mg daily(hold)secondary to hypotension and acute hematoma -9/4 increase Metoprolol 37.5 mg BID -9/4 Digoxin 250 mcg x 1 -9/1Digoxin 62.5 mcg daily -9/5Apixaban  (held)secondary to acute hematoma -9/4 patient had been rate controlled over the past 2 days however given patient's desaturation back in A. fib with RVR. Most likely combination of desaturation, Covid pneumonia -9/5 Albumin 25 g secondary hypotension; after completion of albumin bolus573mlnormal saline -9/5 after above treatment patient's A. fib better controlled but not considered adequately controlled this was explained to Dr Shirl Harris and Mr. Agamjot Kilgallon son/POA both are willing to accept risk associated with not fully controlled A. fib with RVR.  -9/6 rate controlled continue above treatment.   Pulmonary hypertension -Patient has moderate pulmonary HTN by echocardiogram see results below -See A. fib .  CAD -No chest pain, no evidence for ACS -See A. fib with RVR  HTN/Hypotension -See A-Fib -Hypotension resolved.  BPH -Continue Flomax  New onset Lower Extremity Weakness and Thigh Pain --9/4 T-spine MRI pending -9/4 L-spine MRI pending -9/4 pelvic MRI pending -9/5 CT abdomen pelvis W contrast per radiology recommendation; shows active bleeding results below.  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 Although TB can spread into spine (Potts disease), asymptomatic presentation to have no symptoms and then have new onset symptoms. -Patient may also have primary lung neoplasm undiagnosed with mets. -Symptoms all most likely secondary to spontaneous hematomas see below  Acutebilateral psoas and iliacus muscleshematoma -9/5 hold apixaban, ASA and any other blood thinning products. -9/5 CT abdomen pelvis identifiedActive hemorrhage/contrast extravasation is identified within the RIGHT iliopsoas hematoma. See results below -9/6 CT abdomen and pelvis W contrast; appears bleeding has tamponaded at see results below, would monitor patient for the next several days. -9/7  although patient appears to have stabilized would recommend he remain in the hospital until hematomas began to resolve.  Will discuss with his sons.  Acute blood loss anemia with hemorrhagic shock -9/6 transfuse 2 units PRBC -Transfuse for hemoglobin<7 -See acute bilateral psoas and iliacus muscle hematoma Lab Results  Component Value Date   HGB 9.3 (L) 04/25/2020   HGB 8.9 (L) 04/24/2020   HGB 9.7 (L) 04/24/2020   HGB 7.8 (L) 04/24/2020   HGB 5.8 (L) 04/24/2020  -9/7 hemorrhagic shock has resolved, given the size of patient's hematomas would keep patient for at least another 48 hours to ensure stability and reabsorption all some of the blood to allow patient to move more freely.  Spoke with son [name] and he agrees with plan.   Goals of care -9/5 9/4 spoke with sonsDr Irish Elders Mr. Demaryius Imran concerning patient's current hematomas 2 of which appear to be actively bleeding, and which radiology has recommended close follow-up.  Due to family circumstances Dr Irish Elders Mr. Syncere Eble son/POA they have decided to accept the risks associated with patient being discharged from hospital.  Given that family is in the  healthcare field feel that they have extremely good grasp of the repercussions of early discharge and have weighed their decision carefully.  In addition patient has expressed his wish to be discharged regardless of.  9/6 ADDENDUM; Palliative Care Consult:Family had insisted on taking patient home yesterday when it informed that patient was still actively bleeding, and that his A. fib with RVR was not fully controlled.  1  son is a physician, 1 son is POA after long discussion they stated that secondary to family dynamics they were willing to take the risk, understood the risk.  However when they arrived to take patient home he had decompensated, changed her mind wanted everything done for patient refused to take him home.  Patient needs to be made DNR.  Discussion on  future goals of care.  Per nighttime physician note ICU has agreed to allow family to come today in person to discuss plan of care.  Would be most helpful if palliative care was at this meeting. Use due to timing of be that damaged    DVT prophylaxis: SCD (secondary  Khadijahto acute bleed) Code Status: Full Family Communication: 9/6 spoke with son  Mr. Zamauri Nez concerning patient' not being discharged.  States he and his brother did not understand how ill their father was and had requested last night of a ICU physician to have a meeting today discuss options.  Also requested that mother be allowed to visit patient.  This request was passed to [facility].  I informed Mr Yariel Ferraris most likely would not be approved however request was passed to appropriate authorities. -9/6 in addition awaiting palliative care to help arrange meeting with family -9/7 spoke with Mr. [name] agrees with leaving father in the hospital for at least another 48 hours to ensure he is stable.  They would then like to still take him home.    Status is: Inpatient    Dispo: The patient is from: Home              Anticipated d/c is to: Home              Anticipated d/c date is: 9/5              Patient currently unstable      Consultants:  Critical care Palliative Care pending   Procedures/Significant Events:  8/30 CT chest W0 contrast;Widespread multifocal areas of mixed interstitial, ground-glass and consolidative opacities with a peripheral and basilar predominance, with a peripheral and basilar predominance. Findings are favored to represent multifocal pneumonia in the setting of COVID-19 positivity. -Bilateral spiculated masslike opacities in both upper lobes as well as more nodular intermediate to soft tissue attenuation pleural thickening in the upper lungs including a region contiguous with more diffuse conglomerate in infiltrative soft tissue attenuation along the right paramediastinal margin  concerning for underlying malignancy/metastatic disease. -Additional mediastinal nodes concerning for metastatic adenopathy. -Indeterminate nodule of the left adrenal gland measuring up to1.6 cm in size, concerning for metastatic disease. -Somewhat indeterminate sclerotic focus in the left sixth rib laterally. Additional mixed sclerotic and lucent lesion seen in the T11 spinousprocess as well. Could reflect osseous metastatic disease. -Indeterminate1 cm mass in the right renal fossa. Possible cyst from the upper pole right kidney. -Ascending aortic dilatation to 4.1 cm. Recommend annual imaging followup by CTA or MRA.  8/31 echocardiogram;Left Ventricle: LVEF= 55 to 60%.  -Left ventricular diastolic parameters are indeterminate.  -moderately elevated pulmonary artery systolic pressure. estimated right ventricular systolic pressure is 51.7 mmHg.  9/4 MRI T-spine/L-spine/pelvis;-no evidence of metastatic disease to the thoracic or lumbar spine. -. Multiple fluid collections within the psoas and iliacus muscles,RIGHT>>>LEFTconsistent with intramuscular hematomas. Dedicated imaging of this region is recommended. -. Pleural effusions. -Mild multilevel lumbar degenerative disc disease with mild spinal canal stenosis at L3-4. 9/5 CT abdomen pelvis W contrast;Hypodense collection within the splenic hilum, measuring 4.9 x 4cm, compatible with  acute hematoma. Questionable focal active hemorrhage/extravasation at the upper margin of this perisplenic collection, but not convincing. Recommend short-term follow-up CT to ensure stability. -Large intramuscular hematomas involving the bilateral iliopsoas musculature, as described on today's earlier MRI exams and not significantly changed in the short-term interval. -Active hemorrhage/contrast extravasation is identified within the RIGHT iliopsoas hematomaRecommend short-term follow-up CT to ensure resolution. -Bibasilar pleural effusions. Mixed  consolidations and ground-glass opacities at the bilateral lung bases, corresponding to the chest CT findings of 04/17/2020 suggesting multifocal pneumonia. -.Cholelithiasiswithout evidence of acute cholecystitis. -Anasarca.  9/6 CT abdomen pelvis W contrast;-retroperitoneal hematomas involving the bilateral iliopsoas/iliacus musculature, as described above, overall grossly unchanged. While a few tiny foci of possible extravasation persist on the right, this has largely resolved since the prior CT. No new/progressive hemorrhage. -Suspected perisplenic hematoma, unchanged, without active extravasation. Given this finding, and in this clinical context, the underlying diagnosis of COVID-19 associated vasculitis should be considered.  -Please note that infiltrating neoplasm such as perisplenic lymphoma could also have this appearance, but is considered unlikely given the other findings. -Bilateral lower lobe pneumonia in this patient with known COVID-19. -Small bilateral pleural effusions, unchanged. -Review of prior upper lobe nodules on prior chest CT raise the possibility that this may be related to prior infection/inflammation. Specifically, the right upper lobe nodule favors sequela of prior TB. Left upper lobe nodule could reflect TB, benign hamartoma, or possibly neoplasm, warranting 3 month follow-up after resolution of symptoms.    I have personally reviewed and interpreted all radiology studies and my findings are as above.  VENTILATOR SETTINGS: Nasal cannula 9/7 Flow 2 L/min SPO2 99%   Cultures  Antimicrobials: Anti-infectives (From admission, onward)   Start     Ordered Stop   04/18/20 1000  remdesivir 100 mg in sodium chloride 0.9 % 100 mL IVPB       "Followed by" Linked Group Details   04/17/20 1443 04/22/20 0959   04/17/20 1600  remdesivir 200 mg in sodium chloride 0.9% 250 mL IVPB       "Followed by" Linked Group Details   04/17/20 1443 04/17/20 1659   04/17/20 1230   cefTRIAXone (ROCEPHIN) 2 g in sodium chloride 0.9 % 100 mL IVPB  Status:  Discontinued        04/17/20 1218 04/17/20 1725   04/17/20 1230  azithromycin (ZITHROMAX) 500 mg in sodium chloride 0.9 % 250 mL IVPB  Status:  Discontinued        04/17/20 1218 04/17/20 1725       Devices    LINES / TUBES:      Continuous Infusions: . sodium chloride       Objective: Vitals:   04/25/20 0600 04/25/20 0700 04/25/20 0800 04/25/20 0900  BP: (!) 130/53 119/63 (!) 119/55 134/61  Pulse: 92 79 92 93  Resp:    20  Temp:    97.6 F (36.4 C)  TempSrc:      SpO2: 100% 98% 100% 99%  Weight:      Height:        Intake/Output Summary (Last 24 hours) at 04/25/2020 1001 Last data filed at 04/25/2020 0900 Gross per 24 hour  Intake 450 ml  Output 1825 ml  Net -1375 ml   Filed Weights   04/17/20 1017 04/17/20 1915  Weight: 51.7 kg 51.7 kg   Physical Exam:  General: A/O x4, positive acute respiratory distress, cachectic Eyes: negative scleral hemorrhage, negative anisocoria, negative icterus ENT: Negative Runny nose, negative gingival bleeding, Neck:  Negative scars, masses, torticollis, lymphadenopathy, JVD Lungs: decreased breath sounds bilaterally without wheezes or crackles Cardiovascular: Irregular irregular rhythm and rate without murmur gallop or rub normal S1 and S2 Abdomen: Positive bilateral abdominal pain lateral aspect of abdomen consistent with iliopsoas hematoma.  Pain RUQ consistent with hematoma at splenic flexure.  nondistended, positive soft, bowel sounds, no rebound, no ascites, no appreciable mass Extremities: No significant cyanosis, clubbing, or edema bilateral lower extremities Skin: Negative rashes, lesions, ulcers Psychiatric:  Negative depression, negative anxiety, negative fatigue, negative mania  Central nervous system:  Cranial nerves II through XII intact, tongue/uvula midline, all extremities muscle strength 5/5, sensation intact throughout,, negative dysarthria,  negative expressive aphasia, negative receptive aphasia.  .     Data Reviewed: Care during the described time interval was provided by me .  I have reviewed this patient's available data, including medical history, events of note, physical examination, and all test results as part of my evaluation.  CBC: Recent Labs  Lab 04/21/20 0736 04/21/20 0736 04/22/20 0818 04/22/20 0818 04/23/20 0542 04/23/20 0542 04/23/20 2234 04/24/20 0304 04/24/20 0957 04/24/20 1612 04/24/20 2239  WBC 17.4*   < > 29.2*  --  28.9*  --  31.8* 29.3* 26.0*  --   --   NEUTROABS 14.4*  --  22.5*  --  21.5*  --   --  22.0* 18.9*  --   --   HGB 14.1   < > 10.6*   < > 8.0*   < > 6.0* 5.8* 7.8* 9.7* 8.9*  HCT 40.0   < > 31.0*   < > 22.6*   < > 17.1* 17.1* 22.4* 28.2* 24.9*  MCV 91.5   < > 93.9  --  92.2  --  92.9 95.5 92.6  --   --   PLT 703*   < > 887*  --  843*  --  890* 793* 680*  --   --    < > = values in this interval not displayed.   Basic Metabolic Panel: Recent Labs  Lab 04/21/20 0736 04/22/20 0818 04/23/20 0542 04/24/20 0304 04/24/20 0957  NA 131* 130* 131* 130* 131*  K 4.3 4.1 4.6 4.7 4.4  CL 98 98 100 98 99  CO2 23 25 23 22 24   GLUCOSE 130* 154* 151* 135* 128*  BUN 25* 34* 50* 67* 63*  CREATININE 0.83 0.86 1.48* 1.67* 1.30*  CALCIUM 8.6* 7.9* 7.7* 8.2* 7.9*  MG 2.0 2.1 2.3 2.3 2.3  PHOS 3.2 3.9 4.9* 4.8* 4.7*   GFR: Estimated Creatinine Clearance: 29.3 mL/min (A) (by C-G formula based on SCr of 1.3 mg/dL (H)). Liver Function Tests: Recent Labs  Lab 04/21/20 0736 04/22/20 0818 04/23/20 0542 04/24/20 0304 04/24/20 0957  AST 30 26 29  41 39  ALT 25 22 20 25 24   ALKPHOS 84 65 53 54 53  BILITOT 1.4* 1.2 1.2 1.6* 1.5*  PROT 6.3* 5.1* 4.6* 5.2* 5.0*  ALBUMIN 2.9* 2.6* 2.4* 3.0* 2.8*   No results for input(s): LIPASE, AMYLASE in the last 168 hours. No results for input(s): AMMONIA in the last 168 hours. Coagulation Profile: No results for input(s): INR, PROTIME in the last 168  hours. Cardiac Enzymes: No results for input(s): CKTOTAL, CKMB, CKMBINDEX, TROPONINI in the last 168 hours. BNP (last 3 results) No results for input(s): PROBNP in the last 8760 hours. HbA1C: No results for input(s): HGBA1C in the last 72 hours. CBG: Recent Labs  Lab 04/24/20 0139  GLUCAP 117*  Lipid Profile: No results for input(s): CHOL, HDL, LDLCALC, TRIG, CHOLHDL, LDLDIRECT in the last 72 hours. Thyroid Function Tests: No results for input(s): TSH, T4TOTAL, FREET4, T3FREE, THYROIDAB in the last 72 hours. Anemia Panel: Recent Labs    04/24/20 0304 04/24/20 0957  FERRITIN 298 276   Sepsis Labs: No results for input(s): PROCALCITON, LATICACIDVEN in the last 168 hours.  Recent Results (from the past 240 hour(s))  SARS Coronavirus 2 by RT PCR (hospital order, performed in [facility] hospital lab) Nasopharyngeal Nasopharyngeal Swab     Status: Abnormal   Collection Time: 04/17/20 12:02 PM   Specimen: Nasopharyngeal Swab  Result Value Ref Range Status   SARS Coronavirus 2 POSITIVE (A) NEGATIVE Final    Comment: RESULT CALLED TO, READ BACK BY AND VERIFIED WITH: GREG MOYER,RN 1412 04/17/2020 DB (NOTE) SARS-CoV-2 target nucleic acids are DETECTED  SARS-CoV-2 RNA is generally detectable in upper respiratory specimens  during the acute phase of infection.  Positive results are indicative  of the presence of the identified virus, but do not rule out bacterial infection or co-infection with other pathogens not detected by the test.  Clinical correlation with patient history and  other diagnostic information is necessary to determine patient infection status.  The expected result is negative.  Fact Sheet for Patients:   StrictlyIdeas.no   Fact Sheet for Healthcare Providers:   BankingDealers.co.za    This test is not yet approved or cleared by the [place] FDA and  has been authorized for detection and/or diagnosis of  SARS-CoV-2 by FDA under an Emergency Use Authorization (EUA).  This EUA will remain in effect (meaning this test  can be used) for the duration of  the COVID-19 declaration under Section 564(b)(1) of the Act, 21 U.S.C. section 360-bbb-3(b)(1), unless the authorization is terminated or revoked sooner.  Performed at [facility] [address] [place], [place] [address]   Blood Culture (routine x 2)     Status: None   Collection Time: 04/17/20 12:02 PM   Specimen: BLOOD  Result Value Ref Range Status   Specimen Description BLOOD LEFT ARM  Final   Special Requests   Final    BOTTLES DRAWN AEROBIC AND ANAEROBIC Blood Culture adequate volume   Culture   Final    NO GROWTH 5 DAYS Performed at [facility], [address], [place], [place] [address]    Report Status 04/22/2020 FINAL  Final  Urine culture     Status: None   Collection Time: 04/17/20  1:05 PM   Specimen: In/Out Cath Urine  Result Value Ref Range Status   Specimen Description   Final    IN/OUT CATH URINE Performed at [facility], [address] [place], [place] [address]    Special Requests   Final    NONE Performed at [facility], [address] [place], [place] [address]    Culture   Final    NO GROWTH Performed at [facility], [place] [address] [place], [place] [address]    Report Status 04/18/2020 FINAL  Final  Blood Culture (routine x 2)     Status: None   Collection Time: 04/17/20  7:08 PM   Specimen: BLOOD  Result Value Ref Range Status   Specimen Description BLOOD RIGHT ANTECUBITAL  Final   Special Requests   Final    BOTTLES DRAWN AEROBIC AND ANAEROBIC Blood Culture adequate volume   Culture   Final    NO GROWTH 5  DAYS Performed at [facility], [address], [place], [place] [address]    Report Status 04/22/2020 FINAL  Final         Radiology Studies: CT ABDOMEN PELVIS W CONTRAST  Result Date: 04/24/2020 CLINICAL DATA:  Abdominal  pain with distension. Retroperitoneal hematoma. COVID pneumonia. EXAM: CT ABDOMEN AND PELVIS WITH CONTRAST TECHNIQUE: Multidetector CT imaging of the abdomen and pelvis was performed using the standard protocol following bolus administration of intravenous contrast. CONTRAST:  48mL OMNIPAQUE IOHEXOL 300 MG/ML  SOLN COMPARISON:  CT abdomen/pelvis dated 04/23/2020. MR pelvis dated 04/23/2020. CT chest dated 04/17/2020. FINDINGS: Lower chest: Subpleural patchy opacities in the bilateral lower lobes, corresponding to the patient's known COVID pneumonia. Small bilateral pleural effusions, unchanged. Right basilar atelectasis. No: Bilateral upper lobes are not included on this study. However, when correlating with the recent prior CT chest, the irregular right upper lobe nodule is partially calcified and favors an infectious/inflammatory etiology, possibly TB. In that context, the left upper lobe pulmonary nodule may be related to TB, although hamartoma or primary bronchogenic neoplasm are also possible. Regardless, this is unlikely to be related to the patient's presenting symptoms, although this still warrants follow-up. Hepatobiliary: Liver is within normal limits. Densely calcified gallstones with a contracted gallbladder. No associated inflammatory changes. No intrahepatic or extrahepatic duct dilatation. Pancreas: Within normal limits, noting abnormality along the pancreatic tail/splenic hilum, described below. Spleen: Abnormal soft tissue in the splenic hilum which encases the splenic vessels (coronal image 39). This measures 3.9 x 3.9 cm in maximal axial dimension (series 2/image 19), unchanged. There is a mildly irregular appearance centrally within the spleen (coronal image 41), which does raise the possibility of splenic hematoma in this clinical context, but there is no evidence of active extravasation. Infiltrating neoplasm such as perisplenic lymphoma could also have this appearance, but that is considered  unlikely given the additional findings. Adrenals/Urinary Tract: Mild nodular thickening of the left adrenal gland (series 2/image 23). Right adrenal glands are within normal limits. Bilateral renal cysts, including a dominant 2.3 cm cyst in the right upper pole (series 2/image 25). No hydronephrosis. Excretory contrast in the bladder, which is underdistended. Stomach/Bowel: Stomach is notable for a moderate hiatal hernia. No evidence of bowel obstruction. Normal appendix (series 2/image 61). Mild left colonic diverticulosis, without evidence of diverticulitis. Vascular/Lymphatic: No evidence of abdominal aortic aneurysm. Atherosclerotic calcifications of the abdominal aorta and branch vessels. No suspicious abdominopelvic lymphadenopathy. Reproductive: Prostate is unremarkable. Other: Retroperitoneal hematomas involving the bilateral iliopsoas/iliacus musculature. As noted on the prior, greatest axillary extent is on the right, currently measuring 8.6 x 10.2 cm (series 2/image 40), previously 8.7 x 9.9 cm, unchanged. However, the greatest craniocaudal extent is on the left. There are a few tiny foci of possible extravasation remaining inferiorly/posteriorly on the right (series 2/image 45), with disbursement on delayed imaging (series 7/image 33), although this has largely resolved since the prior CT. No new/progressive hemorrhage. Musculoskeletal: Degenerative changes of the visualized thoracolumbar spine. Stable sclerotic lesion in the posterior spinous process at T11 (sagittal image 60). IMPRESSION: Retroperitoneal hematomas involving the bilateral iliopsoas/iliacus musculature, as described above, overall grossly unchanged. While a few tiny foci of possible extravasation persist on the right, this has largely resolved since the prior CT. No new/progressive hemorrhage. Suspected perisplenic hematoma, unchanged, without active extravasation. 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 Please note that infiltrating neoplasm such as perisplenic lymphoma could also have  this appearance, but is considered unlikely given the other findings. Bilateral lower lobe pneumonia in this patient with known COVID-19. Small bilateral pleural effusions, unchanged. Review of prior upper lobe nodules on prior chest CT raise the possibility that this may be related to prior infection/inflammation. Specifically, the right upper lobe nodule favors sequela of prior TB. Left upper lobe nodule could reflect TB, benign hamartoma, or possibly neoplasm, warranting 3 month follow-up after resolution of symptoms. These results were called by telephone at the time of interpretation on 04/24/2020 at 10:40 am to provider [facility] , who verbally acknowledged these results. Electronically Signed   By: Julian Hy M.D.   On: 04/24/2020 10:46   CT ABDOMEN PELVIS W CONTRAST  Result Date: 04/23/2020 CLINICAL DATA:  Retroperitoneal hematoma, follow-up. Abdominal pain with distension. COVID positive. EXAM: CT ABDOMEN AND PELVIS WITH CONTRAST TECHNIQUE: Multidetector CT imaging of the abdomen and pelvis was performed using the standard protocol following bolus administration of intravenous contrast. CONTRAST:  158mL OMNIPAQUE IOHEXOL 300 MG/ML  SOLN COMPARISON:  CT abdomen dated 08/20/2017. MRI lumbar spine from earlier same day. FINDINGS: Lower chest: Bibasilar pleural effusions. Mixed consolidations and ground-glass opacities at the bilateral lung bases, corresponding to the chest CT findings of 04/17/2020 suggesting multifocal pneumonia. Hepatobiliary: Gallbladder is filled with stones. No pericholecystic inflammation or other signs of acute cholecystitis. Common bile duct appears stable in caliber compared to the earlier CT of 08/20/2017. No focal mass or lesion is seen within the liver. Pancreas: Unremarkable. No pancreatic ductal dilatation or surrounding  inflammatory changes. Spleen: Hypodense collection within the splenic hilum, measuring 4.9 x 4 cm, most likely acute blood products, suspected active hemorrhage/extravasation at the upper margin of the collection (anterior margin of the spleen). Adrenals/Urinary Tract: Adrenal glands appear normal. Bilateral renal cysts. No renal stone or hydronephrosis. Bladder is partially decompressed, containing hyperdense material which most likely represents IV contrast excretion. Stomach/Bowel: No dilated large or small bowel loops. No evidence of bowel wall inflammation. Stomach is unremarkable, partially decompressed. Vascular/Lymphatic: Aortic atherosclerosis. No enlarged lymph nodes seen. Reproductive: Prostate is unremarkable. Other: Complex mixed-density collections are present within the bilateral iliopsoas musculature, compatible with the intramuscular hematomas described on earlier MRI exams, RIGHT perhaps slightly greater overall size/thickness than on the LEFT, but more craniocaudal extension within the LEFT iliac muscle to the LEFT upper thigh. These hematomas have not significantly changed in the short-term interval. Small amount of free fluid in the pelvis. Musculoskeletal: No acute appearing osseous abnormality. Ill-defined fluid/edema within the subcutaneous soft tissues indicating anasarca. IMPRESSION: 1. Hypodense collection within the splenic hilum, measuring 4.9 x 4 cm, compatible with acute hematoma. Questionable focal active hemorrhage/extravasation at the upper margin of this perisplenic collection, but not convincing. Recommend short-term follow-up CT to ensure stability. 2. Large intramuscular hematomas involving the bilateral iliopsoas musculature, as described on today's earlier MRI exams and not significantly changed in the short-term interval. 3. Active hemorrhage/contrast extravasation is identified within the RIGHT iliopsoas hematoma (series 2, images 42 through 45; series 7, images 28 and 29).  Recommend short-term follow-up CT to ensure resolution. 4. Bibasilar pleural effusions. Mixed consolidations and ground-glass opacities at the bilateral lung bases, corresponding to the chest CT findings of 04/17/2020 suggesting multifocal pneumonia. 5. Cholelithiasis without evidence of acute cholecystitis. 6. Small amount of free fluid in the pelvis. 7. Anasarca. Aortic Atherosclerosis (ICD10-I70.0). These results and recommendations were called by telephone at the time of interpretation on 04/23/2020 at 2:31 pm to provider Dr. Sherral Hammers, who verbally  acknowledged these results. Electronically Signed   By: Franki Cabot M.D.   On: 04/23/2020 14:39        Scheduled Meds: . atorvastatin  20 mg Oral Daily  . Chlorhexidine Gluconate Cloth  6 each Topical Daily  . digoxin  0.0625 mg Oral Daily  . hydroxyurea  500 mg Oral BID  . Ipratropium-Albuterol  1 puff Inhalation Q6H  . methylPREDNISolone (SOLU-MEDROL) injection  40 mg Intravenous Q12H  . metoprolol tartrate  37.5 mg Oral BID  . pantoprazole  40 mg Oral BID  . sodium chloride flush  3 mL Intravenous Q12H  . tamsulosin  0.4 mg Oral QPC supper   Continuous Infusions: . sodium chloride       LOS: 8 days    Time spent:40 min    Ventura Hollenbeck, Geraldo Docker, MD [facility] Pager [phone]  If 7PM-7AM, please contact night-coverage [email or web address] Password [email or web address] 04/25/2020, 10:01 AM

## 2020-04-25 NOTE — Consult Note (Signed)
Consultation Note Date: 04/25/2020   Patient Name: Clinton Gordon  DOB: 27-Apr-1930  MRN: [id number]  Age / Sex: 86 y.o., male  PCP: Tracie Harrier, MD Referring Physician: Allie Bossier, MD  Reason for Consultation: Establishing goals of care and Psychosocial/spiritual support  HPI/Patient Profile: 86 y.o. male  with past medical history of COPD, HTN, CAD, BPH, essential thrombocytopenia, remote history of TB admitted on 04/17/2020 with pneumonia due to COVID-19 virus states been vaccinated x2 doses for Covid.   Clinical Assessment and Goals of Care: Mr. Vokes is seen through the glass door of the intensive care unit as he is Covid positive.  He will make and keep eye contact, appears elderly, but not toxic.  He is able to wave at me and acknowledge my presence.  He is able to make his needs known.  There is no family at bedside at this time.   Son Emaad Nanna tells me that he spoke with Mr. Bridge today and he "feels better today".  He shares they would want to bring Mr. Britain home to be with his wife of 50+ years.  They would decline STR, but accept [facility] services when able, based on contagious status completed at 9/21.   Son is requesting that 1C nursing staff facilitate a video call to family. .   We talk about having follow-up outpatient with pulmonology, Mr. Fritsch states they do not want to overburden Mr. Nakama with discussions of cancer. 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   Conference with attending.   Goals set, Palliative to follow.    HCPOA    NEXT OF KIN -Mr. Cindric is married to his wife for 50+ years.  However, his 2 sons are his surrogate decision makers.  They make choices as a team.  Mr. Hlavacek also has a daughter.      SUMMARY OF RECOMMENDATIONS   Continue to treat the treatable but no intubation. Time for outcomes. Would want home with home health if possible while Covid  positive Declines short-term rehab   Code Status/Advance Care Planning:  Limited code - No intubation   Symptom Management:   Per hospitalist, no additional needs at this time.  Palliative Prophylaxis:   Frequent Pain Assessment and Turn Reposition  Additional Recommendations (Limitations, Scope, Preferences):  Treat the treatable but no intubation  Psycho-social/Spiritual:   Desire for further Chaplaincy support:no  Additional Recommendations: Caregiving  Support/Resources  Prognosis:   Unable to determine, based on outcomes.  Guarded due to advanced age, chronic illness burden.  Discharge Planning: To be determined, based on outcomes.      Primary Diagnoses: Present on Admission: . Pneumonia due to COVID-19 virus . Essential thrombocythemia ([place]) . Hypertension . BPH without obstruction/lower urinary tract symptoms . Pulmonary hypertension ([place]) . Lower extremity weakness . Thigh pain . Iliopsoas muscle hematoma   I have reviewed the medical record, interviewed the patient and family, and examined the patient. The following aspects are pertinent.  Past Medical History:  Diagnosis Date  .  Hypertension   . Kidney stones    Social History   Socioeconomic History  . Marital status: Married    Spouse name: Not on file  . Number of children: Not on file  . Years of education: Not on file  . Highest education level: Not on file  Occupational History  . Not on file  Tobacco Use  . Smoking status: Never Smoker  . Smokeless tobacco: Never Used  Vaping Use  . Vaping Use: Never used  Substance and Sexual Activity  . Alcohol use: No  . Drug use: Not on file  . Sexual activity: Not on file  Other Topics Concern  . Not on file  Social History Narrative   Retd. 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    Social Determinants of Health   Financial Resource Strain:   . Difficulty of Paying Living Expenses: Not on  file  Food Insecurity:   . Worried About Charity fundraiser in the Last Year: Not on file  . Ran Out of Food in the Last Year: Not on file  Transportation Needs:   . Lack of Transportation (Medical): Not on file  . Lack of Transportation (Non-Medical): Not on file  Physical Activity:   . Days of Exercise per Week: Not on file  . Minutes of Exercise per Session: Not on file  Stress:   . Feeling of Stress : Not on file  Social Connections:   . Frequency of Communication with Friends and Family: Not on file  . Frequency of Social Gatherings with Friends and Family: Not on file  . Attends Religious Services: Not on file  . Active Member of Clubs or Organizations: Not on file  . Attends Archivist Meetings: Not on file  . Marital Status: Not on file   History reviewed. No pertinent family history. Scheduled Meds: . atorvastatin  20 mg Oral Daily  . Chlorhexidine Gluconate Cloth  6 each Topical Daily  . digoxin  0.0625 mg Oral Daily  . hydroxyurea  500 mg Oral BID  . Ipratropium-Albuterol  1 puff Inhalation Q6H  . methylPREDNISolone (SOLU-MEDROL) injection  40 mg Intravenous Q12H  . metoprolol tartrate  37.5 mg Oral BID  . pantoprazole  40 mg Oral BID  . sodium chloride flush  3 mL Intravenous Q12H  . tamsulosin  0.4 mg Oral QPC supper   Continuous Infusions: . sodium chloride     PRN Meds:.sodium chloride, acetaminophen, albuterol, benzocaine, chlorpheniramine-HYDROcodone, guaiFENesin-dextromethorphan, morphine injection, ondansetron **OR** ondansetron (ZOFRAN) IV, polyethylene glycol, sodium chloride flush Medications Prior to Admission:  Prior to Admission medications   Medication Sig Start Date End Date Taking? Authorizing Provider  aspirin 81 MG EC tablet Take 81 mg by mouth daily.    Yes [provider]  atorvastatin (LIPITOR) 20 MG tablet Take 20 mg by mouth daily.  12/16/19  Yes [provider]  hydroxyurea (HYDREA) 500 MG capsule Take 1 capsule  (500 mg total) by mouth 2 (two) times daily. May take with food to minimize GI side effects. 01/25/20  Yes Cammie Sickle, MD  isosorbide mononitrate (IMDUR) 30 MG 24 hr tablet Take 30 mg by mouth daily.  12/16/19  Yes [provider]  losartan (COZAAR) 25 MG tablet Take 25 mg by mouth daily.  12/16/19  Yes [provider]  metoprolol succinate (TOPROL-XL) 25 MG 24 hr tablet Take 25 mg by mouth daily.  12/16/19 12/15/20 Yes [provider]  tamsulosin (FLOMAX) 0.4 MG CAPS capsule Take 0.4 mg by mouth daily after supper.  12/16/19  Yes [provider]  albuterol (VENTOLIN HFA) 108 (90 Base) MCG/ACT inhaler Inhale 1-2 puffs into the lungs every 4 (four) hours as needed for wheezing or shortness of breath. 04/23/20   Allie Bossier, MD  digoxin 62.5 MCG TABS Take 0.0625 mg by mouth daily. 04/24/20   Allie Bossier, MD  guaiFENesin-dextromethorphan (ROBITUSSIN DM) 100-10 MG/5ML syrup Take 10 mLs by mouth every 4 (four) hours as needed for cough. 04/23/20   Allie Bossier, MD  Ipratropium-Albuterol (COMBIVENT) 20-100 MCG/ACT AERS respimat Inhale 1 puff into the lungs every 6 (six) hours. 04/23/20   Allie Bossier, MD  Metoprolol Tartrate 37.5 MG TABS Take 12.5 mg by mouth 2 (two) times daily. 04/23/20   Allie Bossier, MD  ondansetron (ZOFRAN) 4 MG tablet Take 1 tablet (4 mg total) by mouth every 6 (six) hours as needed for nausea. 04/23/20   Allie Bossier, MD  pantoprazole (PROTONIX) 40 MG tablet Take 1 tablet (40 mg total) by mouth 2 (two) times daily. 04/23/20   Allie Bossier, MD   No Known Allergies Review of Systems  Unable to perform ROS: Other    Physical Exam Vitals and nursing note reviewed.  Constitutional:      General: He is not in acute distress. Cardiovascular:     Rate and Rhythm: Normal rate.  Neurological:     Mental Status: He is alert.     Vital Signs: BP 134/61   Pulse 93   Temp 97.6 F (36.4 C)   Resp 20   Ht 5\' 2"  (1.575 m)   Wt 51.7  kg   SpO2 99%   BMI 20.85 kg/m  Pain Scale: 0-10 POSS *See Group Information*: 1-Acceptable,Awake and alert Pain Score: 0-No pain   SpO2: SpO2: 99 % O2 Device:SpO2: 99 % O2 Flow Rate: .O2 Flow Rate (L/min): 2 L/min  IO: Intake/output summary:   Intake/Output Summary (Last 24 hours) at 04/25/2020 1603 Last data filed at 04/25/2020 0900 Gross per 24 hour  Intake --  Output 975 ml  Net -975 ml    LBM: Last BM Date: 04/24/20 Baseline Weight: Weight: 51.7 kg Most recent weight: Weight: 51.7 kg     Palliative Assessment/Data:   Flowsheet Rows     Most Recent Value  Intake Tab  Referral Department Hospitalist  Unit at Time of Referral Cardiac/Telemetry Unit  Palliative Care Primary Diagnosis Pulmonary  Date Notified 04/24/20  Palliative Care Type New Palliative care  Reason for referral Clarify Goals of Care  Date of Admission 04/17/20  Date first seen by Palliative Care 04/25/20  # of days Palliative referral response time 1 Day(s)  # of days IP prior to Palliative referral 7  Clinical Assessment  Palliative Performance Scale Score 30%  Pain Max last 24 hours Not able to report  Pain Min Last 24 hours Not able to report  Dyspnea Max Last 24 Hours Not able to report  Dyspnea Min Last 24 hours Not able to report  Psychosocial & Spiritual Assessment  Palliative Care Outcomes      Time In: 1310 Time Out: 1400  Time Total: 50 minutes  Greater than 50%  of this time was spent counseling and coordinating care related to the above assessment and plan.  Signed by: Drue Novel, NP   Please contact Palliative Medicine Team phone at [phone] for questions and concerns.  For individual provider: See Shea Evans

## 2020-04-25 NOTE — Progress Notes (Signed)
Per Dr. Sherral Hammers patient can be transferred to 1C. Charge nurse notified.

## 2020-04-26 DIAGNOSIS — U071 COVID-19: Secondary | ICD-10-CM | POA: Diagnosis not present

## 2020-04-26 DIAGNOSIS — J1282 Pneumonia due to coronavirus disease 2019: Secondary | ICD-10-CM | POA: Diagnosis not present

## 2020-04-26 DIAGNOSIS — I10 Essential (primary) hypertension: Secondary | ICD-10-CM | POA: Diagnosis not present

## 2020-04-26 DIAGNOSIS — R0602 Shortness of breath: Secondary | ICD-10-CM | POA: Diagnosis not present

## 2020-04-26 DIAGNOSIS — S7010XS Contusion of unspecified thigh, sequela: Secondary | ICD-10-CM | POA: Diagnosis not present

## 2020-04-26 LAB — CBC WITH DIFFERENTIAL/PLATELET
Abs Immature Granulocytes: 1.73 10*3/uL — ABNORMAL HIGH (ref 0.00–0.07)
Basophils Absolute: 0 10*3/uL (ref 0.0–0.1)
Basophils Relative: 0 %
Eosinophils Absolute: 0 10*3/uL (ref 0.0–0.5)
Eosinophils Relative: 0 %
HCT: 25.9 % — ABNORMAL LOW (ref 39.0–52.0)
Hemoglobin: 9.1 g/dL — ABNORMAL LOW (ref 13.0–17.0)
Immature Granulocytes: 7 %
Lymphocytes Relative: 7 %
Lymphs Abs: 1.6 10*3/uL (ref 0.7–4.0)
MCH: 31.9 pg (ref 26.0–34.0)
MCHC: 35.1 g/dL (ref 30.0–36.0)
MCV: 90.9 fL (ref 80.0–100.0)
Monocytes Absolute: 1.2 10*3/uL — ABNORMAL HIGH (ref 0.1–1.0)
Monocytes Relative: 5 %
Neutro Abs: 19 10*3/uL — ABNORMAL HIGH (ref 1.7–7.7)
Neutrophils Relative %: 81 %
Platelets: 559 10*3/uL — ABNORMAL HIGH (ref 150–400)
RBC: 2.85 MIL/uL — ABNORMAL LOW (ref 4.22–5.81)
RDW: 22.5 % — ABNORMAL HIGH (ref 11.5–15.5)
Smear Review: NORMAL
WBC: 23.6 10*3/uL — ABNORMAL HIGH (ref 4.0–10.5)
nRBC: 2.3 % — ABNORMAL HIGH (ref 0.0–0.2)

## 2020-04-26 LAB — COMPREHENSIVE METABOLIC PANEL
ALT: 27 U/L (ref 0–44)
AST: 31 U/L (ref 15–41)
Albumin: 2.7 g/dL — ABNORMAL LOW (ref 3.5–5.0)
Alkaline Phosphatase: 59 U/L (ref 38–126)
Anion gap: 8 (ref 5–15)
BUN: 37 mg/dL — ABNORMAL HIGH (ref 8–23)
CO2: 27 mmol/L (ref 22–32)
Calcium: 8.1 mg/dL — ABNORMAL LOW (ref 8.9–10.3)
Chloride: 98 mmol/L (ref 98–111)
Creatinine, Ser: 0.89 mg/dL (ref 0.61–1.24)
GFR calc Af Amer: >60 mL/min (ref >60)
GFR calc non Af Amer: >60 mL/min (ref >60)
Glucose, Bld: 147 mg/dL — ABNORMAL HIGH (ref 70–99)
Potassium: 4.4 mmol/L (ref 3.5–5.1)
Sodium: 133 mmol/L — ABNORMAL LOW (ref 135–145)
Total Bilirubin: 2.1 mg/dL — ABNORMAL HIGH (ref 0.3–1.2)
Total Protein: 4.9 g/dL — ABNORMAL LOW (ref 6.5–8.1)

## 2020-04-26 LAB — FERRITIN: Ferritin: 267 ng/mL (ref 24–336)

## 2020-04-26 LAB — PHOSPHORUS: Phosphorus: 3.5 mg/dL (ref 2.5–4.6)

## 2020-04-26 LAB — MAGNESIUM: Magnesium: 2.3 mg/dL (ref 1.7–2.4)

## 2020-04-26 LAB — LACTATE DEHYDROGENASE: LDH: 359 U/L — ABNORMAL HIGH (ref 98–192)

## 2020-04-26 LAB — PATHOLOGIST SMEAR REVIEW

## 2020-04-26 NOTE — Progress Notes (Signed)
Pts family given daily update

## 2020-04-26 NOTE — Progress Notes (Addendum)
PROGRESS NOTE    Clinton Gordon  [id number] DOB: 1929/09/08 DOA: 04/17/2020 PCP: Tracie Harrier, MD   Brief Narrative: Clinton Gordon an 86 y.o.malewith PMHx significant for COPD, HTN, CAD, BPH, essential thrombocytosis and remote history of TB who was in his usual state of health until 4 days ago when he developed malaise and cough. Patient states 2 days ago he became short of breath and was having difficulty breathing even at rest. Patient denies any nausea vomiting or diarrhea. Denies abdominal pain. Denies chest pain. His main concern is difficulty catching his breath. Patient admits to cough, unclear whether it is productive. Denies fevers or chills. Patient states he has been vaccinated x2 for Covid. ED Course:The patient was noted to be afebrile with tachycardia. He was somewhat hypoxic on room air but corrected to 95% with 2 L. Chest x-ray showed multifocal pulmonary opacities consistent with multifocal pneumonia as well as a possible loculated left-sided effusion. Covid test was positive. Patient was started on steroids and remdesivir and admitted for ongoing    Assessment & Plan:  Principal Problem:   Pneumonia due to COVID-19 virus Active Problems:   Essential thrombocythemia ([place])   Hypertension   BPH without obstruction/lower urinary tract symptoms   Pulmonary hypertension (HCC)   Lower extremity weakness   Thigh pain   Iliopsoas muscle hematoma   Sepsis with acute hypoxic respiratory failure without septic shock (HCC)   Goals of care, counseling/discussion   Palliative care by specialist  Covidpneumonia/COPD -Not on home O2 -Hydroxyurea 500 mg BID -Solu-Medrol40 mg BID -Remdesivirper pharmacy protocol -Combivent QID -Titrate O2 to maintain SPO2> 88% -Patient will be considered contagious until 9/21. Patient will need to take all general precautions; see below  -Schedule follow-up appointment with Clinton. Loma Sousa who  evaluated patient in hospital. Patient will require biopsy and per note IGRA/quantify for now as well as other etiology including fungal. -SATURATION QUALIFICATIONS: (Thisnote is usedto comply with regulatory documentation for home oxygen) Patient Saturations on Room Air at Rest =92% Patient Saturations on Room Air while Ambulating =Please see the note below. Patient Saturations on2Liters of oxygen while Ambulating = See note below Please briefly explain why patient needs home oxygen: Pt's O2 sats at rest 93%. O2 sats dropped to 81% sitting in bed. HR increased to 116. Pt is weak and stayed in bed during assessment. 2L O2 reapplied, O2 sats up to mid 90's. -Place patient on 2 L O2 titrate to maintain SPO2> 93% -Provide [employer] portable home O2 oxygen generator -Anticipate d/c in am to home with home health.  Abnormal chest CT with nodules -Patient has spiculated nodules with adenopathy on chest CT which is concerning for malignancy However patient's sonDr Neila Gordon he has history of TB in the past -9/3 discussed case with critical care and Clinton Gordon.  Critical care agreed to see patient to determine when appropriate for biopsy and follow-up. -9/6 discussed case with Clinton. Alfonse Ras Radiologist who performed chest CT today see results below. -9/8 d/w son about this being possibly related to his prior TB illness however we don't have a way to compare.Outpatient provider to compare and designate further plan per family wishes.   New onset atrial fibrillation with RVR -CHADS2VASC score=4 -Imdur 30 mg daily(held)secondary to hypotension and acute hematoma -9/5Losartan 12.5 mg daily(hold)secondary to hypotensionand acute hematoma -9/4 increase Metoprolol 37.5 mg BID -9/4 Digoxin 250 mcg x 1 -9/1Digoxin 62.5 mcg daily -9/5Apixaban (held)secondary toacutehematoma -9/4 patient had been rate controlled over the  past 2 days however given patient's  desaturation back in A. fib with RVR. Most likely combination of desaturation, Covid pneumonia -9/5 Albumin 25 g secondary hypotension; after completion of albumin bolus572mlnormal saline -9/5 after above treatment patient's A. fib better controlled but not considered adequately controlled this was explained toDr Blanche East. Clinton Gordon are willing to accept risk associated with not fully controlled A. fib with RVR.  -9/6 rate controlled continue above treatment. -9/8 HR is controlled continue digoxin , and pt is at risk for vte and cva but is also bleeding and AC is contraindicated at this time.  Pt to f/u with pcp for repeat hemoglobin and plan for anemia.   Pulmonary hypertension -Patient has moderate pulmonary HTN by echocardiogram see results below -See A. fib .  CAD -No chest pain, no evidence for ACS -See A. fib with RVR  HTN/Hypotension -See A-Fib -Hypotension resolved.  BPH -Continue Flomax  New onset Lower Extremity Weakness and Thigh Pain --9/4 T-spine MRI pending -9/4 L-spine MRI pending -9/4 pelvic MRI pending -9/5 CT abdomen pelvis W contrast per radiology recommendation; shows active bleeding results below.  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 Although TB can spread into spine (Potts disease), asymptomatic presentation to have no symptoms and then have new onset symptoms. -Patient may also have primary lung neoplasm undiagnosed with mets. -Symptoms all most likely secondary to spontaneous hematomas see below  Acutebilateral psoas and iliacus muscleshematoma -9/5 hold apixaban, ASA and any other blood thinning products. -9/5 CT abdomen pelvis identifiedActive hemorrhage/contrast extravasation is identified within the RIGHT iliopsoas hematoma. See results below -9/6 CT abdomen and pelvis W contrast; appears bleeding has  tamponaded at see results below, would monitor patient for the next several days. -9/7 although patient appears to have stabilized would recommend he remain in the hospital until hematomas began to resolve.  Will discuss with his sons.  Acute blood loss anemia with hemorrhagic shock -9/6 transfuse 2 units PRBC -Transfuse for hemoglobin<7 -See acute bilateral psoas and iliacus muscle hematoma Recent Labs       Lab Results  Component Value Date   HGB 9.3 (L) 04/25/2020   HGB 8.9 (L) 04/24/2020   HGB 9.7 (L) 04/24/2020   HGB 7.8 (L) 04/24/2020   HGB 5.8 (L) 04/24/2020    -9/7 hemorrhagic shock has resolved, given the size of patient's hematomas would keep patient for at least another 48 hours to ensure stability and reabsorption all some of the blood to allow patient to move more freely.  Spoke with son Clinton Gordon and he agrees with plan. -9/8 pt is stable and h/h is 9.3 and we will repeat and follow .Transfuse if and when needed.  Goals of care -9/59/4 spoke with sonsDr Irish Elders Mr. Clinton Hopes Patelconcerning patient's current hematomas 2 of which appear to be actively bleeding, and which radiology has recommended close follow-up. Due to family circumstances Clinton Irish Elders Mr. Clinton Gordon/POAthey have decided to accept the risks associated with patient being discharged from [place] that family is in thehealthcare field feel that they have extremely good grasp of the repercussions of early discharge and have weighed their decision carefully. In addition patient has expressed his wish to be discharged regardless of.  -9/8 spoke to son amar today about dads care plan and possible d/c in am.   9/6 ADDENDUM;PalliativeCareConsult:Family had insisted on taking patient home yesterday when it informed  that patient was still actively bleeding, and that his A. fib with RVR was not fully controlled. 1 son is a physician, 1 son is POA after long discussion they  stated that secondary to family dynamics they were willing to take the risk, understood the risk. However when they arrived to take patient home he had decompensated, changed her mind wanted everything done for patient refused to take him home. Patient needs to be made DNR. Discussion on future goals of care. Per nighttime physician note ICU has agreed to allow family to come today in person to discuss plan of care. Would be most helpful if palliative care was at this meeting. Use due to timing of be that damaged     DVT prophylaxis: SCD (secondary  to acute bleed) Code Status: Full Family Communication:  9/6 spoke with son  Mr. Jamael Hoffmann concerning patient' not being discharged.  States he and his brother did not understand how ill their father was and had requested last night of a ICU physician to have a meeting today discuss options.  Also requested that mother be allowed to visit patient.  This request was passed to [facility].  I informed Mr Elba Dendinger most likely would not be approved however request was passed to appropriate authorities. -9/6 in addition awaiting palliative care to help arrange meeting with family -9/7 spoke with Mr. Clinton Hopes agrees with leaving father in the hospital for at least another 48 hours to ensure he is stable.  They would then like to still take him home.   9/8 none family at bedside.   Status is: Inpatient  Subjective: 9/7 afebrile overnight A/O x4, patient much more comfortable.  RN states patient not eating much drinks some tea for breakfast. 9/8 Vitals are stable, HR less than 100. H/h is stable and spoke to son about d/c in am plan.   Objective: Vitals:   04/25/20 2000 04/25/20 2109 04/25/20 2339 04/26/20 0332  BP: 114/65 (!) 141/83 117/72 129/70  Pulse: 97 88 77 81  Resp: 14 20 16 18   Temp: 97.8 F (36.6 C) 98.5 F (36.9 C) 97.9 F (36.6 C) 97.8 F (36.6 C)  TempSrc: Oral Oral Oral Oral  SpO2: 100% 100% 100% 100%  Weight:      Height:         Intake/Output Summary (Last 24 hours) at 04/26/2020 0757 Last data filed at 04/26/2020 0754 Gross per 24 hour  Intake --  Output 1275 ml  Net -1275 ml   Filed Weights   04/17/20 1017 04/17/20 1915  Weight: 51.7 kg 51.7 kg    Examination: Blood pressure 129/70, pulse 81, temperature 97.8 F (36.6 C), temperature source Oral, resp. rate 18, height 5\' 2"  (1.575 m), weight 51.7 kg, SpO2 100 %. General exam: Appears calm and comfortable  Respiratory system: Clear to auscultation. Respiratory effort normal. Cardiovascular system: S1 & S2 heard, RRR. No JVD, murmurs, rubs, gallops or clicks. No pedal edema. Gastrointestinal system: Abdomen is nondistended, soft and nontender. No organomegaly or masses felt. Normal bowel sounds heard. Central nervous system: Alert and oriented. No focal neurological deficits. Extremities: Symmetric 5 x 5 power. Skin: No rashes, lesions or ulcers Psychiatry: Judgement and insight appear normal. Mood & affect appropriate.   Data Reviewed: I have personally reviewed following labs and imaging studies  CBC: Recent Labs  Lab 04/23/20 0542 04/23/20 0542 04/23/20 2234 04/23/20 2234 04/24/20 0304 04/24/20 0304 04/24/20 0957 04/24/20 1612 04/24/20 2239 04/25/20 1054 04/26/20 0614  WBC 28.9*   < >  31.8*  --  29.3*  --  26.0*  --   --  25.8* 23.6*  NEUTROABS 21.5*  --   --   --  22.0*  --  18.9*  --   --  20.8* PENDING  HGB 8.0*   < > 6.0*   < > 5.8*   < > 7.8* 9.7* 8.9* 9.3* 9.1*  HCT 22.6*   < > 17.1*   < > 17.1*   < > 22.4* 28.2* 24.9* 25.6* 25.9*  MCV 92.2   < > 92.9  --  95.5  --  92.6  --   --  89.5 90.9  PLT 843*   < > 890*  --  793*  --  680*  --   --  618* 559*   < > = values in this interval not displayed.   Basic Metabolic Panel: Recent Labs  Lab 04/23/20 0542 04/24/20 0304 04/24/20 0957 04/25/20 1054 04/26/20 0614  NA 131* 130* 131* 134* 133*  K 4.6 4.7 4.4 4.6 4.4  CL 100 98 99 100 98  CO2 23 22 24 26 27   GLUCOSE 151* 135*  128* 142* 147*  BUN 50* 67* 63* 46* 37*  CREATININE 1.48* 1.67* 1.30* 0.93 0.89  CALCIUM 7.7* 8.2* 7.9* 8.2* 8.1*  MG 2.3 2.3 2.3 2.6* 2.3  PHOS 4.9* 4.8* 4.7* 3.6 3.5   GFR: Estimated Creatinine Clearance: 42 mL/min (by C-G formula based on SCr of 0.89 mg/dL). Liver Function Tests: Recent Labs  Lab 04/23/20 0542 04/24/20 0304 04/24/20 0957 04/25/20 1054 04/26/20 0614  AST 29 41 39 37 31  ALT 20 25 24 27 27   ALKPHOS 53 54 53 54 59  BILITOT 1.2 1.6* 1.5* 2.1* 2.1*  PROT 4.6* 5.2* 5.0* 5.2* 4.9*  ALBUMIN 2.4* 3.0* 2.8* 2.8* 2.7*   CBG: Recent Labs  Lab 04/24/20 0139  GLUCAP 117*   Anemia Panel: Recent Labs    04/25/20 1054 04/26/20 0614  FERRITIN 303 267     Recent Results (from the past 240 hour(s))  SARS Coronavirus 2 by RT PCR (hospital order, performed in [facility] hospital lab) Nasopharyngeal Nasopharyngeal Swab     Status: Abnormal   Collection Time: 04/17/20 12:02 PM   Specimen: Nasopharyngeal Swab  Result Value Ref Range Status   SARS Coronavirus 2 POSITIVE (A) NEGATIVE Final    Comment: RESULT CALLED TO, READ BACK BY AND VERIFIED WITH: GREG MOYER,RN 1412 04/17/2020 DB (NOTE) SARS-CoV-2 target nucleic acids are DETECTED  SARS-CoV-2 RNA is generally detectable in upper respiratory specimens  during the acute phase of infection.  Positive results are indicative  of the presence of the identified virus, but do not rule out bacterial infection or co-infection with other pathogens not detected by the test.  Clinical correlation with patient history and  other diagnostic information is necessary to determine patient infection status.  The expected result is negative.  Fact Sheet for Patients:   StrictlyIdeas.no   Fact Sheet for Healthcare Providers:   BankingDealers.co.za    This test is not yet approved or cleared by the [place] FDA and  has been authorized for detection and/or diagnosis of  SARS-CoV-2 by FDA under an Emergency Use Authorization (EUA).  This EUA will remain in effect (meaning this test  can be used) for the duration of  the COVID-19 declaration under Section 564(b)(1) of the Act, 21 U.S.C. section 360-bbb-3(b)(1), unless the authorization is terminated or revoked sooner.  Performed at [facility], [place]  [address] [place], [place] [address]   Blood Culture (routine x 2)     Status: None   Collection Time: 04/17/20 12:02 PM   Specimen: BLOOD  Result Value Ref Range Status   Specimen Description BLOOD LEFT ARM  Final   Special Requests   Final    BOTTLES DRAWN AEROBIC AND ANAEROBIC Blood Culture adequate volume   Culture   Final    NO GROWTH 5 DAYS Performed at [facility], [address], [place], [place] [address]    Report Status 04/22/2020 FINAL  Final  Urine culture     Status: None   Collection Time: 04/17/20  1:05 PM   Specimen: In/Out Cath Urine  Result Value Ref Range Status   Specimen Description   Final    IN/OUT CATH URINE Performed at [facility], [address] [place], [place] [address]    Special Requests   Final    NONE Performed at [facility], [address], [place], [place] [address]    Culture   Final    NO GROWTH Performed at [facility], [place] [address] [place], [place] [address]    Report Status 04/18/2020 FINAL  Final  Blood Culture (routine x 2)     Status: None   Collection Time: 04/17/20  7:08 PM   Specimen: BLOOD  Result Value Ref Range Status   Specimen Description BLOOD RIGHT ANTECUBITAL  Final   Special Requests   Final    BOTTLES DRAWN AEROBIC AND ANAEROBIC Blood Culture adequate volume   Culture   Final    NO GROWTH 5 DAYS Performed at [facility], [address], [place], [place] [address]    Report Status 04/22/2020 FINAL  Final     Radiology Studies: CT ABDOMEN PELVIS W CONTRAST  Result Date: 04/24/2020 CLINICAL DATA:  Abdominal pain  with distension. Retroperitoneal hematoma. COVID pneumonia. EXAM: CT ABDOMEN AND PELVIS WITH CONTRAST TECHNIQUE: Multidetector CT imaging of the abdomen and pelvis was performed using the standard protocol following bolus administration of intravenous contrast. CONTRAST:  6mL OMNIPAQUE IOHEXOL 300 MG/ML  SOLN COMPARISON:  CT abdomen/pelvis dated 04/23/2020. MR pelvis dated 04/23/2020. CT chest dated 04/17/2020. FINDINGS: Lower chest: Subpleural patchy opacities in the bilateral lower lobes, corresponding to the patient's known COVID pneumonia. Small bilateral pleural effusions, unchanged. Right basilar atelectasis. No: Bilateral upper lobes are not included on this study. However, when correlating with the recent prior CT chest, the irregular right upper lobe nodule is partially calcified and favors an infectious/inflammatory etiology, possibly TB. In that context, the left upper lobe pulmonary nodule may be related to TB, although hamartoma or primary bronchogenic neoplasm are also possible. Regardless, this is unlikely to be related to the patient's presenting symptoms, although this still warrants follow-up. Hepatobiliary: Liver is within normal limits. Densely calcified gallstones with a contracted gallbladder. No associated inflammatory changes. No intrahepatic or extrahepatic duct dilatation. Pancreas: Within normal limits, noting abnormality along the pancreatic tail/splenic hilum, described below. Spleen: Abnormal soft tissue in the splenic hilum which encases the splenic vessels (coronal image 39). This measures 3.9 x 3.9 cm in maximal axial dimension (series 2/image 19), unchanged. There is a mildly irregular appearance centrally within the spleen (coronal image 41), which does raise the possibility of splenic hematoma in this clinical context, but there is no evidence of active extravasation. Infiltrating neoplasm such as perisplenic lymphoma could also have this appearance, but that is considered  unlikely given the additional findings.  Adrenals/Urinary Tract: Mild nodular thickening of the left adrenal gland (series 2/image 23). Right adrenal glands are within normal limits. Bilateral renal cysts, including a dominant 2.3 cm cyst in the right upper pole (series 2/image 25). No hydronephrosis. Excretory contrast in the bladder, which is underdistended. Stomach/Bowel: Stomach is notable for a moderate hiatal hernia. No evidence of bowel obstruction. Normal appendix (series 2/image 61). Mild left colonic diverticulosis, without evidence of diverticulitis. Vascular/Lymphatic: No evidence of abdominal aortic aneurysm. Atherosclerotic calcifications of the abdominal aorta and branch vessels. No suspicious abdominopelvic lymphadenopathy. Reproductive: Prostate is unremarkable. Other: Retroperitoneal hematomas involving the bilateral iliopsoas/iliacus musculature. As noted on the prior, greatest axillary extent is on the right, currently measuring 8.6 x 10.2 cm (series 2/image 40), previously 8.7 x 9.9 cm, unchanged. However, the greatest craniocaudal extent is on the left. There are a few tiny foci of possible extravasation remaining inferiorly/posteriorly on the right (series 2/image 45), with disbursement on delayed imaging (series 7/image 33), although this has largely resolved since the prior CT. No new/progressive hemorrhage. Musculoskeletal: Degenerative changes of the visualized thoracolumbar spine. Stable sclerotic lesion in the posterior spinous process at T11 (sagittal image 60). IMPRESSION: Retroperitoneal hematomas involving the bilateral iliopsoas/iliacus musculature, as described above, overall grossly unchanged. While a few tiny foci of possible extravasation persist on the right, this has largely resolved since the prior CT. No new/progressive hemorrhage. Suspected perisplenic hematoma, unchanged, without active extravasation. 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 Please note that infiltrating neoplasm such as perisplenic lymphoma could also have this appearance, but is considered unlikely given the other findings. Bilateral lower lobe pneumonia in this patient with known COVID-19. Small bilateral pleural effusions, unchanged. Review of prior upper lobe nodules on prior chest CT raise the possibility that this may be related to prior infection/inflammation. Specifically, the right upper lobe nodule favors sequela of prior TB. Left upper lobe nodule could reflect TB, benign hamartoma, or possibly neoplasm, warranting 3 month follow-up after resolution of symptoms. These results were called by telephone at the time of interpretation on 04/24/2020 at 10:40 am to provider [facility] , who verbally acknowledged these results. Electronically Signed   By: Julian Hy M.D.   On: 04/24/2020 10:46   Scheduled Meds: . atorvastatin  20 mg Oral Daily  . digoxin  0.0625 mg Oral Daily  . hydroxyurea  500 mg Oral BID  . Ipratropium-Albuterol  1 puff Inhalation Q6H  . methylPREDNISolone (SOLU-MEDROL) injection  40 mg Intravenous Q12H  . metoprolol tartrate  37.5 mg Oral BID  . pantoprazole  40 mg Oral BID  . sodium chloride flush  3 mL Intravenous Q12H  . tamsulosin  0.4 mg Oral QPC supper   Continuous Infusions: . sodium chloride       LOS: 9 days   Para Skeans, MD [facility] Pager [phone] If 7PM-7AM, please contact night-coverage [email or web address] Password [facility] 04/26/2020, 7:57 AM

## 2020-04-27 ENCOUNTER — Inpatient Hospital Stay: Payer: Medicare Other

## 2020-04-27 ENCOUNTER — Encounter: Payer: Self-pay | Admitting: Internal Medicine

## 2020-04-27 DIAGNOSIS — U071 COVID-19: Secondary | ICD-10-CM | POA: Diagnosis not present

## 2020-04-27 DIAGNOSIS — J1282 Pneumonia due to coronavirus disease 2019: Secondary | ICD-10-CM | POA: Diagnosis not present

## 2020-04-27 DIAGNOSIS — R0602 Shortness of breath: Secondary | ICD-10-CM | POA: Diagnosis not present

## 2020-04-27 LAB — CBC WITH DIFFERENTIAL/PLATELET
Abs Immature Granulocytes: 1.5 10*3/uL — ABNORMAL HIGH (ref 0.00–0.07)
Basophils Absolute: 0 10*3/uL (ref 0.0–0.1)
Basophils Relative: 0 %
Eosinophils Absolute: 0 10*3/uL (ref 0.0–0.5)
Eosinophils Relative: 0 %
HCT: 27 % — ABNORMAL LOW (ref 39.0–52.0)
Hemoglobin: 9.2 g/dL — ABNORMAL LOW (ref 13.0–17.0)
Immature Granulocytes: 6 %
Lymphocytes Relative: 8 %
Lymphs Abs: 1.9 10*3/uL (ref 0.7–4.0)
MCH: 32.6 pg (ref 26.0–34.0)
MCHC: 34.1 g/dL (ref 30.0–36.0)
MCV: 95.7 fL (ref 80.0–100.0)
Monocytes Absolute: 1.1 10*3/uL — ABNORMAL HIGH (ref 0.1–1.0)
Monocytes Relative: 5 %
Neutro Abs: 19.5 10*3/uL — ABNORMAL HIGH (ref 1.7–7.7)
Neutrophils Relative %: 81 %
Platelets: 566 10*3/uL — ABNORMAL HIGH (ref 150–400)
RBC: 2.82 MIL/uL — ABNORMAL LOW (ref 4.22–5.81)
RDW: 23.8 % — ABNORMAL HIGH (ref 11.5–15.5)
Smear Review: NORMAL
WBC: 24 10*3/uL — ABNORMAL HIGH (ref 4.0–10.5)
nRBC: 1.6 % — ABNORMAL HIGH (ref 0.0–0.2)

## 2020-04-27 LAB — COMPREHENSIVE METABOLIC PANEL
ALT: 28 U/L (ref 0–44)
AST: 29 U/L (ref 15–41)
Albumin: 2.8 g/dL — ABNORMAL LOW (ref 3.5–5.0)
Alkaline Phosphatase: 59 U/L (ref 38–126)
Anion gap: 7 (ref 5–15)
BUN: 30 mg/dL — ABNORMAL HIGH (ref 8–23)
CO2: 29 mmol/L (ref 22–32)
Calcium: 8.3 mg/dL — ABNORMAL LOW (ref 8.9–10.3)
Chloride: 97 mmol/L — ABNORMAL LOW (ref 98–111)
Creatinine, Ser: 0.97 mg/dL (ref 0.61–1.24)
GFR calc Af Amer: >60 mL/min (ref >60)
GFR calc non Af Amer: >60 mL/min (ref >60)
Glucose, Bld: 148 mg/dL — ABNORMAL HIGH (ref 70–99)
Potassium: 5.1 mmol/L (ref 3.5–5.1)
Sodium: 133 mmol/L — ABNORMAL LOW (ref 135–145)
Total Bilirubin: 2.2 mg/dL — ABNORMAL HIGH (ref 0.3–1.2)
Total Protein: 5 g/dL — ABNORMAL LOW (ref 6.5–8.1)

## 2020-04-27 LAB — FERRITIN: Ferritin: 265 ng/mL (ref 24–336)

## 2020-04-27 LAB — PHOSPHORUS: Phosphorus: 3.6 mg/dL (ref 2.5–4.6)

## 2020-04-27 LAB — LACTATE DEHYDROGENASE: LDH: 381 U/L — ABNORMAL HIGH (ref 98–192)

## 2020-04-27 LAB — MAGNESIUM: Magnesium: 2.4 mg/dL (ref 1.7–2.4)

## 2020-04-27 LAB — ASPERGILLUS ANTIGEN, BAL/SERUM: Aspergillus Ag, BAL/Serum: 0.12 Index (ref 0.00–0.49)

## 2020-04-27 IMAGING — CT CT ABD-PELV W/ CM
2 of 5 series · 15 of 46 positions shown, 17 images · IV contrast (omnipaque)
Comparison: [DATE], [DATE]

CLINICAL DATA: Follow-up retroperitoneal hematoma

EXAM:
CT ABDOMEN AND PELVIS WITH CONTRAST
TECHNIQUE: Multidetector CT imaging of the abdomen and pelvis was performed
using the standard protocol following bolus administration of
intravenous contrast.
CONTRAST:  75mL OMNIPAQUE IOHEXOL 300 MG/ML  SOLN

[Series 2: routine abd/pel with · axial · 0.68mm/px · z∈[-728,-323]mm · 12 of 91 slices shown, 14 images]
[im 5/91  soft-tissue]
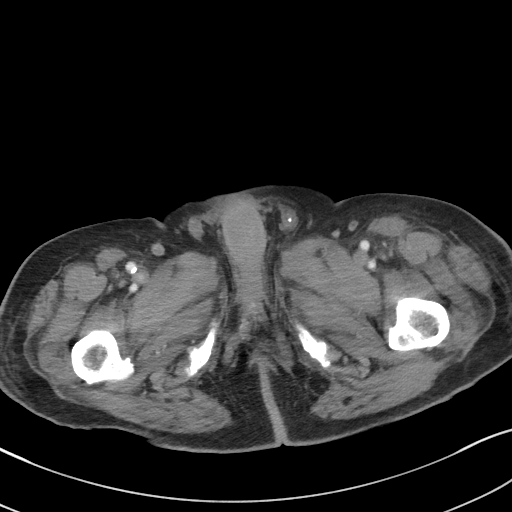
[im 5/91  bone]
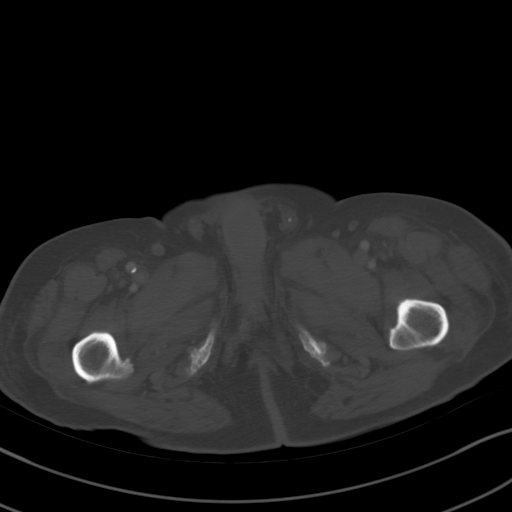
[im 15/91  soft-tissue]
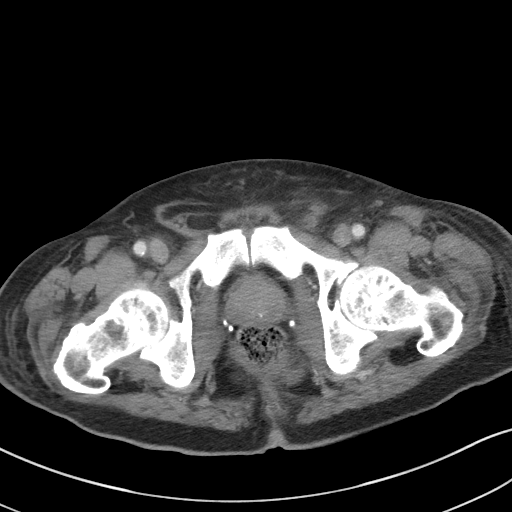
[im 19/91  soft-tissue]
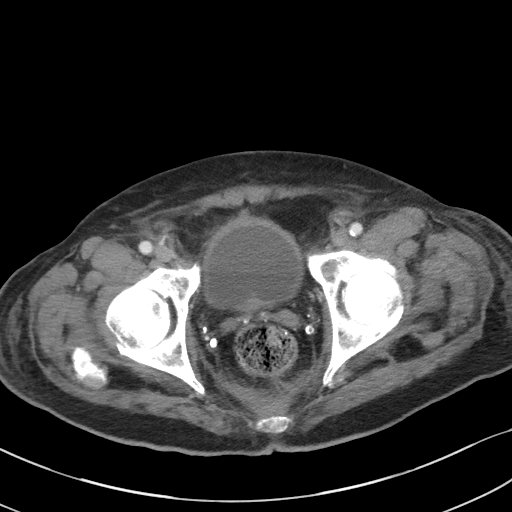
[im 29/91  soft-tissue]
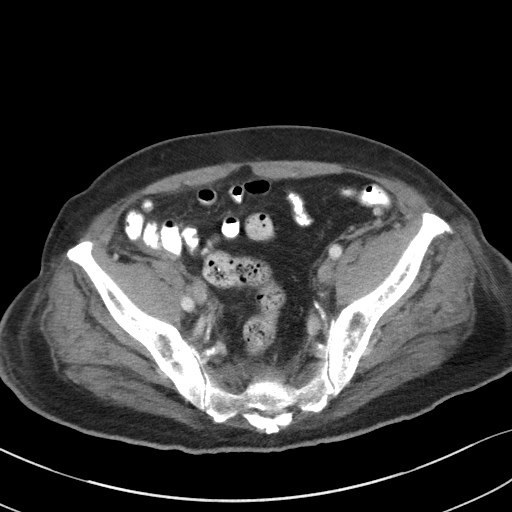
[im 34/91  soft-tissue]
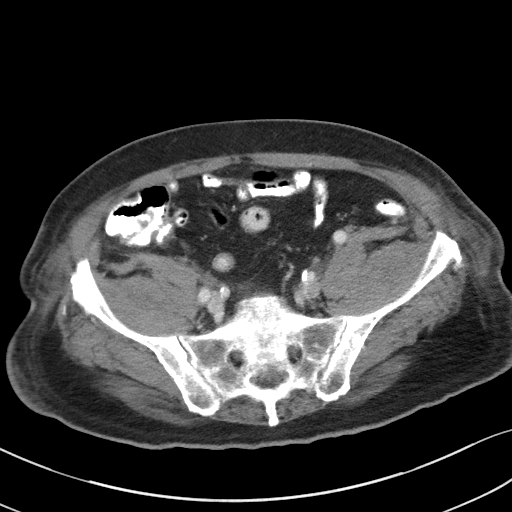
[im 43/91  soft-tissue]
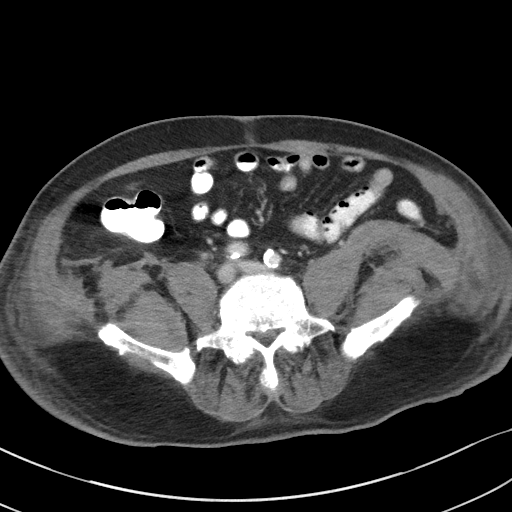
[im 48/91  soft-tissue]
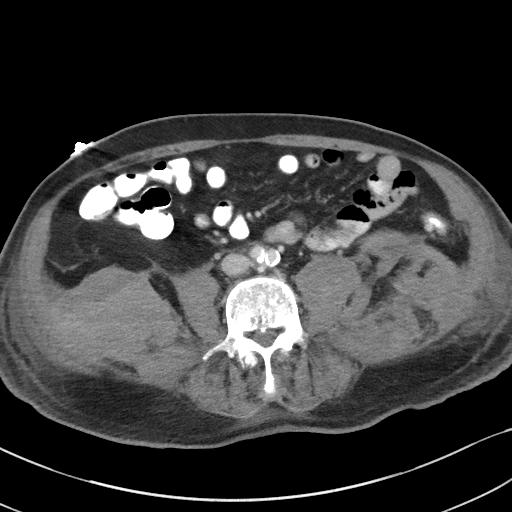
[im 57/91  soft-tissue]
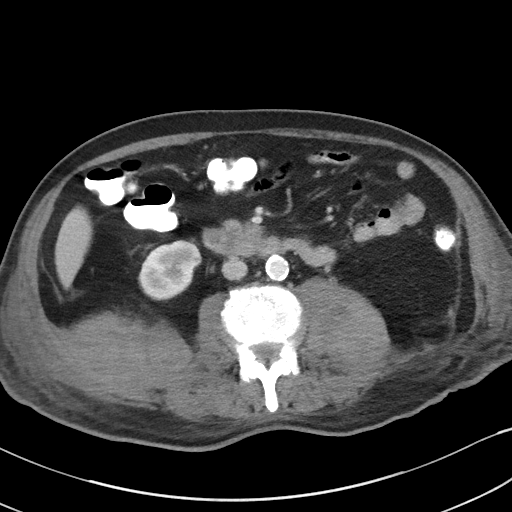
[im 62/91  soft-tissue]
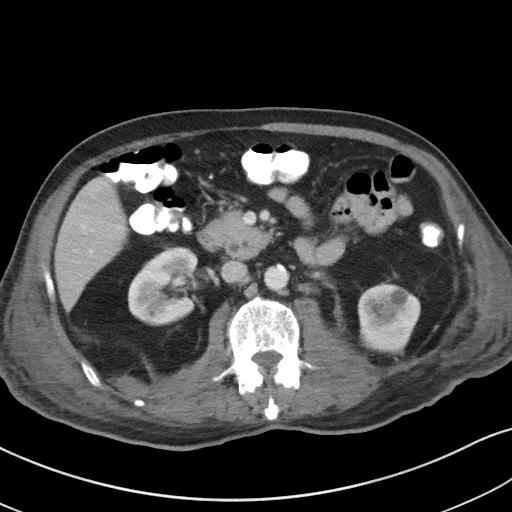
[im 62/91  bone]
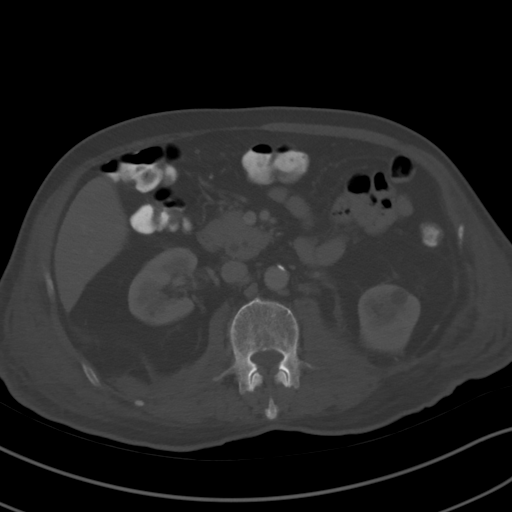
[im 72/91  soft-tissue]
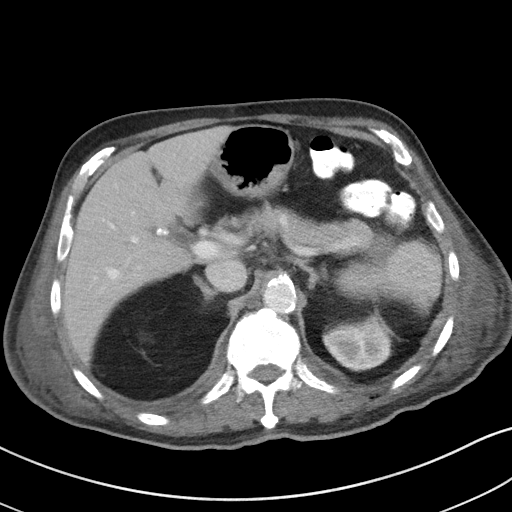
[im 76/91  soft-tissue]
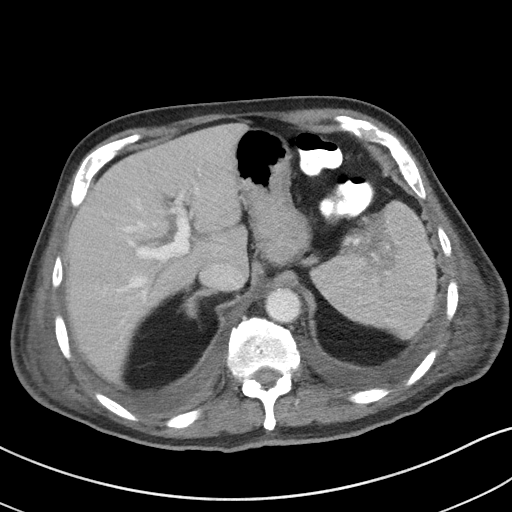
[im 86/91  soft-tissue]
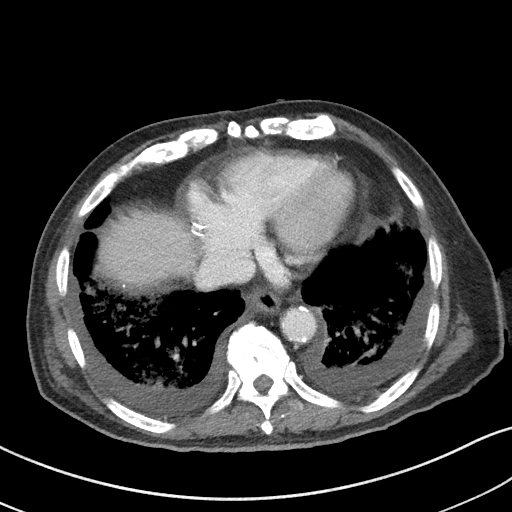

[Series 6: coronal st · coronal · 0.72mm/px · 3 of 76 slices shown]
[im 26/76  soft-tissue]
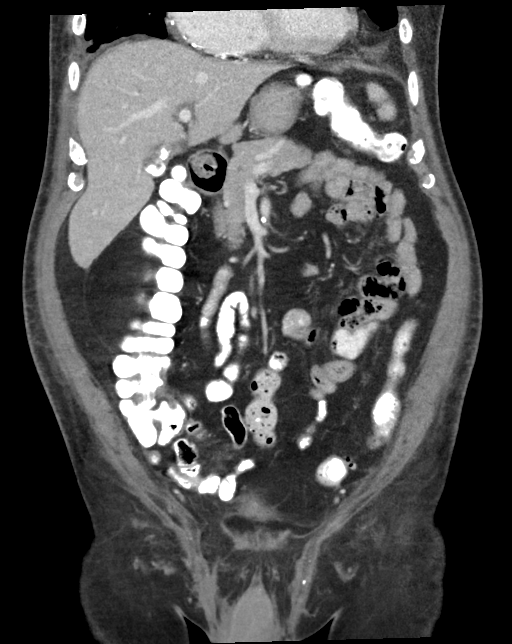
[im 34/76  soft-tissue]
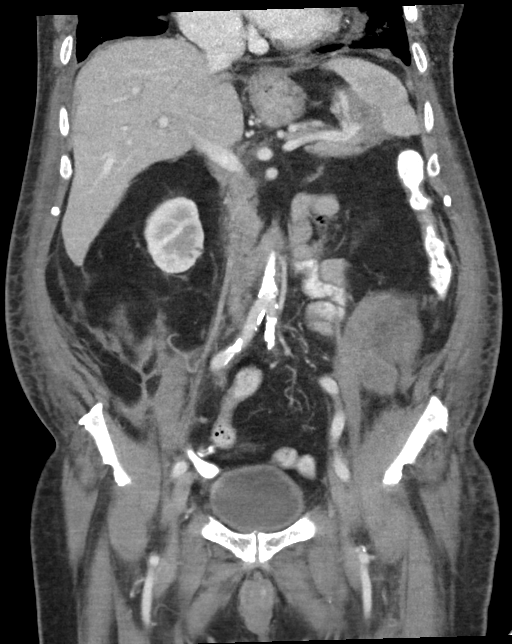
[im 42/76  soft-tissue]
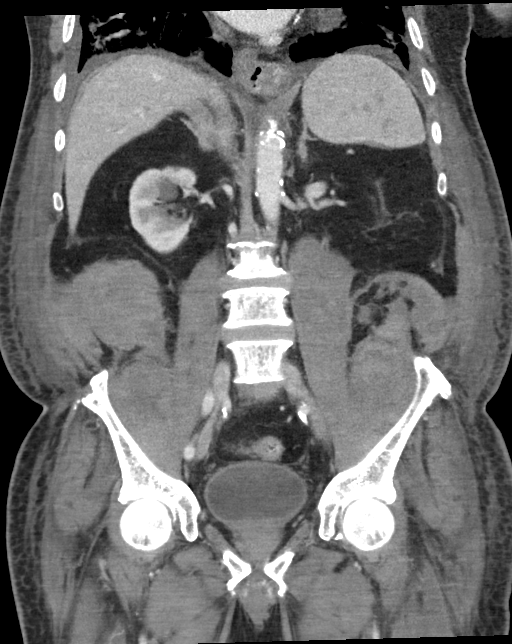

[15 of 46 positions shown; findings below may reference images not displayed]

FINDINGS: Lower chest: Small bilateral pleural effusions are similar to prior.
Patchy ground-glass and airspace opacities within the visualized
lung bases are slightly progressed from prior.

Hepatobiliary: Densely calcified gallstones are again noted within a
contracted gallbladder. Liver is within normal limits. No biliary
dilatation.

Pancreas: Unchanged appearance of the pancreas with abnormality at
the splenic hilum/distal pancreatic tail, as described below.

Spleen: Redemonstration of irregular low density soft tissue within
the splenic hilum measuring approximately 3.9 x 3.9 cm trans axially
(series 2, image 16), unchanged from priors. This density also abuts
the distal most aspect of the pancreatic tail.

Adrenals/Urinary Tract: Unchanged nodular thickening of the left
adrenal gland. Unremarkable right adrenal gland. Bilateral renal
cysts. Kidneys are unchanged in appearance. Mild prominence of the
left renal pelvis, similar to prior without hydronephrosis. Mild
circumferential urinary bladder wall thickening.

Stomach/Bowel: Moderate hiatal hernia. Bowel is well distended with
oral contrast. No dilated loops of bowel to suggest obstruction.
Scattered colonic diverticulosis. No focal colonic thickening or
inflammatory changes.

Vascular/Lymphatic: Aortic atherosclerosis. No enlarged abdominal or
pelvic lymph nodes.

Reproductive: Prostate is unremarkable.

Other: Extensive bilateral retroperitoneal hematomas with
involvement of the bilateral psoas and iliacus muscles. Right-sided
hematoma measures approximately 10.0 x 7.2 cm trans axially (series
2, image 41), previously approximately 10.2 x 8.6 cm. Left-sided
hematoma does not appear appreciably changed. No new/progressive
hemorrhage. No pneumoperitoneum.

Musculoskeletal: No new or acute osseous findings.
IMPRESSION: 1. Extensive bilateral retroperitoneal hematomas are stable to
slightly decreased in size compared to prior. No evidence of
new/progressive hemorrhage.
2. Unchanged appearance of irregular density at the splenic hilum
with differential remaining perisplenic hematoma or infiltrative
neoplasm. Continued attention on follow-up is recommended.
3. Patchy ground-glass and airspace opacities within the visualized
lung bases are slightly progressed from prior study. Findings may
represent worsening pneumonia and/or superimposed edema.
4. Small bilateral pleural effusions are similar to prior.
5. Additional chronic/incidental findings, as above.

## 2020-04-27 MED ORDER — DEXAMETHASONE 6 MG PO TABS: 6.0000 mg | ORAL_TABLET | Freq: Every day | ORAL | 0 refills | Status: AC

## 2020-04-27 MED ORDER — IOHEXOL 300 MG/ML  SOLN
75.0000 mL | Freq: Once | INTRAMUSCULAR | Status: AC | PRN
  Administered 2020-04-27: 16:00:00 75 mL via INTRAVENOUS

## 2020-04-27 MED ORDER — PANTOPRAZOLE SODIUM 40 MG PO TBEC: 40.0000 mg | DELAYED_RELEASE_TABLET | Freq: Two times a day (BID) | ORAL | 0 refills | Status: AC

## 2020-04-27 MED ORDER — METOPROLOL TARTRATE 25 MG PO TABS: 12.5000 mg | ORAL_TABLET | Freq: Two times a day (BID) | ORAL | 0 refills | Status: AC

## 2020-04-27 MED ORDER — HYDROXYUREA 500 MG PO CAPS: 500.0000 mg | ORAL_CAPSULE | Freq: Two times a day (BID) | ORAL | 0 refills | Status: DC

## 2020-04-27 MED ORDER — ALBUTEROL SULFATE HFA 108 (90 BASE) MCG/ACT IN AERS: 1.0000 | INHALATION_SPRAY | RESPIRATORY_TRACT | 0 refills | Status: DC | PRN

## 2020-04-27 MED ORDER — DIGOXIN 62.5 MCG PO TABS
0.0625 mg | ORAL_TABLET | Freq: Every day | ORAL | 0 refills | Status: DC
Start: 2020-04-27 — End: 2020-09-18

## 2020-04-27 MED ORDER — IOHEXOL 9 MG/ML PO SOLN
500.0000 mL | ORAL | Status: AC
  Administered 2020-04-27 (×2): 500 mL via ORAL

## 2020-04-27 MED ORDER — IPRATROPIUM-ALBUTEROL 20-100 MCG/ACT IN AERS: 1.0000 | INHALATION_SPRAY | Freq: Four times a day (QID) | RESPIRATORY_TRACT | 0 refills | Status: AC

## 2020-04-27 MED ORDER — GUAIFENESIN-DM 100-10 MG/5ML PO SYRP: 10.0000 mL | ORAL_SOLUTION | ORAL | 0 refills | Status: DC | PRN

## 2020-04-27 NOTE — Discharge Summary (Signed)
Physician Discharge Summary  Clinton Gordon [id number] DOB: [birth year] DOA: 04/17/2020  PCP: Clinton Harrier, MD  Admit date: 04/17/2020 Discharge date: 04/27/2020  Time spent: 35 minutes  Recommendations for Outpatient Follow-up:  Please follow on all appointments and save patient instructions and directions.    Discharge Diagnoses:  Principal Problem:   Pneumonia due to COVID-19 virus Active Problems:   Essential thrombocythemia ([place])   Hypertension   BPH without obstruction/lower urinary tract symptoms   Pulmonary hypertension (HCC)   Lower extremity weakness   Thigh pain   Iliopsoas muscle hematoma   Sepsis with acute hypoxic respiratory failure without septic shock (HCC)   Goals of care, counseling/discussion   Palliative care by specialist Covidpneumonia/COPD -Not on home O2 -Hydroxyurea 500 mg BID -Solu-Medrol40 mg BID -Remdesivirper pharmacy protocol -Combivent QID -Titrate O2 to maintain SPO2> 88% -Patient will be considered contagious until 9/21. Patient will need to take all general precautions; see below  -Schedule follow-up appointment with Clinton. Loma Gordon who evaluated patient in hospital. Patient will require biopsy and per note IGRA/quantify for now as well as other etiology including fungal. -SATURATION QUALIFICATIONS: (Thisnote is usedto comply with regulatory documentation for home oxygen) Patient Saturations on Room Air at Rest =92% Patient Saturations on Room Air while Ambulating =Please see the note below. Patient Saturations on2Liters of oxygen while Ambulating = See note below Please briefly explain why patient needs home oxygen: Pt's O2 sats at rest 93%. O2 sats dropped to 81% sitting in bed. HR increased to 116. Pt is weak and stayed in bed during assessment. 2L O2 reapplied, O2 sats up to mid 90's. -Place patient on 2 L O2 titrate to maintain SPO2> 93% -Provide Inogen portable home O2 oxygen  generator -Anticipate d/c in am to home with home health. -d/c today with home o2 2-3 L on ambulation .  Abnormal chest CT with nodules -Patient has spiculated nodules with adenopathy on chest CT which is concerning for malignancy However patient's sonDr Clinton Gordon he has history of TB in the past -9/3 discussed case with critical care and Clinton Gordon. Critical care agreed to see patient to determine when appropriate for biopsy and follow-up. -9/6 discussed case with Clinton. Bertell Gordon who performed chest CT today see results below. -9/8 d/w son about this being possibly related to his prior TB illness however we don't have a way to compare.Outpatient provider to compare and designate further plan per family wishes.  9/9 outpatient f/u with pulmonary .   New onset atrial fibrillation with RVR -CHADS2VASC score=4 -Imdur 30 mg daily(held)secondary to hypotension and acute hematoma -9/5Losartan 12.5 mg daily(hold)secondary to hypotensionand acute hematoma -9/4 increase Metoprolol 37.5 mg BID -9/4 Digoxin 250 mcg x 1 -9/1Digoxin 62.5 mcg daily -9/5Apixaban (held)secondary toacutehematoma -9/4 patient had been rate controlled over the past 2 days however given patient's desaturation back in A. fib with RVR. Most likely combination of desaturation, Covid pneumonia -9/5 Albumin 25 g secondary hypotension; after completion of albumin bolus585mlnormal saline -9/5 after above treatment patient's A. fib better controlled but not considered adequately controlled this was explained toDr Blanche East. Ryane Gordon are willing to accept risk associated with not fully controlled A. fib with RVR.  -9/6 rate controlled continue above treatment. -9/8 HR is controlled continue digoxin , and pt is at risk for vte and cva but is also bleeding and AC is contraindicated at this time.  Pt to f/u with pcp for repeat hemoglobin and plan for anemia.  -  cont  metoprolol and digoxin.   Pulmonary hypertension -Patient has moderate pulmonary HTN by echocardiogram see results below -See A. fib .  CAD -No chest pain, no evidence for ACS -See A. fib with RVR  HTN/Hypotension -See A-Fib -Hypotension resolved.  BPH -Continue Flomax  New onset Lower Extremity Weakness and Thigh Pain --9/4 T-spine MRI pending -9/4 L-spine MRI pending -9/4 pelvic MRI pending -9/5 CT abdomen pelvis W contrast per radiology recommendation; shows active bleeding results below.  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 Although TB can spread into spine (Potts disease), asymptomatic presentation to have no symptoms and then have new onset symptoms. -Patient may also have primary lung neoplasm undiagnosed with mets. -Symptoms all most likely secondary to spontaneous hematomas see below  Acutebilateral psoas and iliacus muscleshematoma -9/5 hold apixaban, ASA and any other blood thinning products. -9/5 CT abdomen pelvis identifiedActive hemorrhage/contrast extravasation is identified within the RIGHT iliopsoas hematoma. See results below -9/6 CT abdomen and pelvis W contrast; appears bleeding has tamponaded at see results below, would monitor patient for the next several days. -9/7 although patient appears to have stabilized would recommend he remain in the hospital until hematomas began to resolve. Will discuss with his sons. - repeat ct imaging shows hematomas becoming smaller.   Acute blood loss anemia with hemorrhagic shock -9/6transfuse2 units PRBC -Transfuse for hemoglobin<7 -See acute bilateral psoas and iliacus muscle hematoma Recent Labs       Lab Results  Component Value Date   HGB 9.3 (L) 04/25/2020   HGB 8.9 (L) 04/24/2020   HGB 9.7 (L) 04/24/2020   HGB 7.8 (L) 04/24/2020   HGB 5.8 (L) 04/24/2020    -9/7 hemorrhagic  shock has resolved, given the size of patient's hematomas would keep patient for at least another 48 hours to ensure stability and reabsorption all some of the blood to allow patient to move more freely. Spoke with son Mr. Clinton Gordon and he agrees with plan. -9/8 pt is stable and h/h is 9.3 and we will repeat and follow .Transfuse if and when needed. 9/9 hb remains stable and weekly  hb check at pcp./  Goals of care -9/59/4 spoke with sonsDr Irish Elders Mr. [name] Patelconcerning patient's current hematomas 2 of which appear to be actively bleeding, and which radiology has recommended close follow-up. Due to family circumstances Clinton Irish Elders Mr. [name] Patelson/POAthey have decided to accept the risks associated with patient being discharged from [place] that family is in thehealthcare field feel that they have extremely good grasp of the repercussions of early discharge and have weighed their decision carefully. In addition patient has expressed his wish to be discharged regardless of.       Discharge Condition:  Stable.   Diet recommendation:  2 gram sodium and heart healthy diet.    Filed Weights   04/17/20 1017 04/17/20 1915  Weight: 51.7 kg 51.7 kg    History of present illness:  VINEET KINNEY is an 86 y.o. male with PMH significant for COPD, HTN, CAD, BPH, essential thrombocytosis and remote history of TB who was in his usual state of health until 4 days ago when he developed malaise and cough.  Patient states 2 days ago he became short of breath and was having difficulty breathing even at rest.  Patient denies any nausea vomiting or diarrhea.  Denies abdominal pain.  Denies chest pain.  His main concern  is difficulty catching his breath.  Patient admits to cough, unclear whether it is productive.  Denies fevers or chills. Patient states he has been vaccinated x2 for Covid.  ED Course:  The patient was noted to be afebrile with tachycardia.  He was  somewhat hypoxic on room air but corrected to 95% with 2 L.  Chest x-ray showed multifocal pulmonary opacities consistent with multifocal pneumonia as well as a possible loculated left-sided effusion.  Covid test was positive.  Patient was started on steroids and remdesivir and admitted for ongoing treatment.  Hospital Course:  On day 2 - 8/31 pt was improving on oxygen and was [facility] for his new a.fib with xarelto. Patient has spiculated nodules with adenopathy on chest CT which is concerning for malignancy However patient's sonDr Clinton Gordon he has history of TB in the past This will need to be investigated either as an Inpatient oroutpatient after Covid pneumonia is improved. Pulmonology was consulted and Clinton. Lanney Gins saw pt and plan is for outpatient f/u for his abnormal CT.Pt was stable and being treated for Inpatient regimen for his covid-19 pneumonia.The day of d/c September 5th pt had syncopal episode and discharge was cancelled.  Pt transferred from bed to wheelchair to be discharged home.  Pt became unresponsive and incontinent upon transfer.  Coincidentally, hospitalist provider came into room at that time.  Pt placed back into bed and placed into trendelenburg and he then returned to baseline.  Discharge home has been cancelled.Pt was further evaluated and found to have retroperitoneal hemorrhage.pt was transfused and hemoglobin has been stable . Pt has been on my service from 9/8 and has been stable and has been on RA and hb has been stable. D/W family about plan for repeat check for cbc with pcp and cards and pulmonary follow up .  Procedures:  Echo  Consultations:  Clinton.Aleskerov.  Discharge Exam: Vitals:   04/27/20 1000 04/27/20 1200  BP:  (!) 149/64  Pulse:  72  Resp:  19  Temp:  98.1 F (36.7 C)  SpO2: 94% 96%   Physical Exam Vitals reviewed.  Constitutional:      Appearance: He is normal weight.  HENT:     Head: Normocephalic.  Eyes:     Extraocular Movements:  Extraocular movements intact.  Cardiovascular:     Rate and Rhythm: Normal rate and regular rhythm.  Pulmonary:     Breath sounds: Examination of the right-middle field reveals wheezing. Examination of the left-middle field reveals wheezing. Examination of the right-lower field reveals wheezing. Examination of the left-lower field reveals wheezing. Wheezing present.  Abdominal:     General: Bowel sounds are normal.     Palpations: Abdomen is soft.  Skin:    General: Skin is warm.  Neurological:     General: No focal deficit present.     Mental Status: He is alert and oriented to person, place, and time.  Psychiatric:        Mood and Affect: Mood normal.    Discharge Instructions   Discharge Instructions    Call MD for:  difficulty breathing, headache or visual disturbances   Complete by: As directed    Call MD for:  extreme fatigue   Complete by: As directed    Call MD for:  hives   Complete by: As directed    Call MD for:  persistant dizziness or light-headedness   Complete by: As directed    Call MD for:  persistant nausea and vomiting  Complete by: As directed    Call MD for:  severe uncontrolled pain   Complete by: As directed    Call MD for:  temperature >100.4   Complete by: As directed    Diet - low sodium heart healthy   Complete by: As directed    Discharge instructions   Complete by: As directed    ?   Person Under Monitoring Name: Clinton Gordon  Location: [place] [address]   Infection Prevention Recommendations for Individuals Confirmed to have, or Being Evaluated for, 2019 Novel Coronavirus (COVID-19) Infection Who Receive Care at Home  Individuals who are confirmed to have, or are being evaluated for, COVID-19 should follow the prevention steps below until a healthcare provider or local or state health department says they can return to normal activities.  Stay home except to get medical care You should restrict  activities outside your home, except for getting medical care. Do not go to work, school, or public areas, and do not use public transportation or taxis.  Call ahead before visiting your doctor Before your medical appointment, call the healthcare provider and tell them that you have, or are being evaluated for, COVID-19 infection. This will help the healthcare provider's office take steps to keep other people from getting infected. Ask your healthcare provider to call the local or state health department.  Monitor your symptoms Seek prompt medical attention if your illness is worsening (e.g., difficulty breathing). Before going to your medical appointment, call the healthcare provider and tell them that you have, or are being evaluated for, COVID-19 infection. Ask your healthcare provider to call the local or state health department.  Wear a facemask You should wear a facemask that covers your nose and mouth when you are in the same room with other people and when you visit a healthcare provider. People who live with or visit you should also wear a facemask while they are in the same room with you.  Separate yourself from other people in your home As much as possible, you should stay in a different room from other people in your home. Also, you should use a separate bathroom, if available.  Avoid sharing household items You should not share dishes, drinking glasses, cups, eating utensils, towels, bedding, or other items with other people in your home. After using these items, you should wash them thoroughly with soap and water.  Cover your coughs and sneezes Cover your mouth and nose with a tissue when you cough or sneeze, or you can cough or sneeze into your sleeve. Throw used tissues in a lined trash can, and immediately wash your hands with soap and water for at least 20 seconds or use an alcohol-based hand rub.  Wash your [facility] your hands often and thoroughly with soap and  water for at least 20 seconds. You can use an alcohol-based hand sanitizer if soap and water are not available and if your hands are not visibly dirty. Avoid touching your eyes, nose, and mouth with unwashed hands.   Prevention Steps for Caregivers and Household Members of Individuals Confirmed to have, or Being Evaluated for, COVID-19 Infection Being Cared for in the Home  If you live with, or provide care at home for, a person confirmed to have, or being evaluated for, COVID-19 infection please follow these guidelines to prevent infection:  Follow healthcare provider's instructions Make sure that you understand and can help the patient follow any healthcare provider instructions for all care.  Provide for the patient's basic needs You should help the patient with basic needs in the home and provide support for getting groceries, prescriptions, and other personal needs.  Monitor the patient's symptoms If they are getting sicker, call his or her medical provider and tell them that the patient has, or is being evaluated for, COVID-19 infection. This will help the healthcare provider's office take steps to keep other people from getting infected. Ask the healthcare provider to call the local or state health department.  Limit the number of people who have contact with the patient If possible, have only one caregiver for the patient. Other household members should stay in another home or place of residence. If this is not possible, they should stay in another room, or be separated from the patient as much as possible. Use a separate bathroom, if available. Restrict visitors who do not have an essential need to be in the home.  Keep older adults, very young children, and other sick people away from the patient Keep older adults, very young children, and those who have compromised immune systems or chronic health conditions away from the patient. This includes people with chronic heart,  lung, or kidney conditions, diabetes, and cancer.  Ensure good ventilation Make sure that shared spaces in the home have good air flow, such as from an air conditioner or an opened window, weather permitting.  Wash your hands often Wash your hands often and thoroughly with soap and water for at least 20 seconds. You can use an alcohol based hand sanitizer if soap and water are not available and if your hands are not visibly dirty. Avoid touching your eyes, nose, and mouth with unwashed hands. Use disposable paper towels to dry your hands. If not available, use dedicated cloth towels and replace them when they become wet.  Wear a facemask and gloves Wear a disposable facemask at all times in the room and gloves when you touch or have contact with the patient's blood, body fluids, and/or secretions or excretions, such as sweat, saliva, sputum, nasal mucus, vomit, urine, or feces.  Ensure the mask fits over your nose and mouth tightly, and do not touch it during use. Throw out disposable facemasks and gloves after using them. Do not reuse. Wash your hands immediately after removing your facemask and gloves. If your personal clothing becomes contaminated, carefully remove clothing and launder. Wash your hands after handling contaminated clothing. Place all used disposable facemasks, gloves, and other waste in a lined container before disposing them with other household waste. Remove gloves and wash your hands immediately after handling these items.  Do not share dishes, glasses, or other household items with the patient Avoid sharing household items. You should not share dishes, drinking glasses, cups, eating utensils, towels, bedding, or other items with a patient who is confirmed to have, or being evaluated for, COVID-19 infection. After the person uses these items, you should wash them thoroughly with soap and water.  Wash laundry thoroughly Immediately remove and wash clothes or bedding that  have blood, body fluids, and/or secretions or excretions, such as sweat, saliva, sputum, nasal mucus, vomit, urine, or feces, on them. Wear gloves when handling laundry from the patient. Read and follow directions on labels of laundry or clothing items and detergent. In general, wash and dry with the warmest temperatures recommended on the label.  Clean all areas the individual has used often Clean all touchable surfaces, such as counters, tabletops, doorknobs, bathroom fixtures, toilets, phones, keyboards, tablets,  and bedside tables, every day. Also, clean any surfaces that may have blood, body fluids, and/or secretions or excretions on them. Wear gloves when cleaning surfaces the patient has come in contact with. Use a diluted bleach solution (e.g., dilute bleach with 1 part bleach and 10 parts water) or a household disinfectant with a label that says EPA-registered for coronaviruses. To make a bleach solution at home, add 1 tablespoon of bleach to 1 quart (4 cups) of water. For a larger supply, add  cup of bleach to 1 gallon (16 cups) of water. Read labels of cleaning products and follow recommendations provided on product labels. Labels contain instructions for safe and effective use of the cleaning product including precautions you should take when applying the product, such as wearing gloves or eye protection and making sure you have good ventilation during use of the product. Remove gloves and wash hands immediately after cleaning.  Monitor yourself for signs and symptoms of illness Caregivers and household members are considered close contacts, should monitor their health, and will be asked to limit movement outside of the home to the extent possible. Follow the monitoring steps for close contacts listed on the symptom monitoring form.   ? If you have additional questions, contact your local health department or call the epidemiologist on call at [phone] (available 24/7). ? This  guidance is subject to change. For the most up-to-date guidance from [facility], please refer to their website: YouBlogs.pl   Driving Restrictions   Complete by: As directed    PT TO NOT DRIVE.   For home use only DME oxygen   Complete by: As directed    SATURATION QUALIFICATIONS: (This note is used to comply with regulatory documentation for home oxygen) Patient Saturations on Room Air at Rest = 92% Patient Saturations on Room Air while Ambulating = Please see the note below. Patient Saturations on 2 Liters of oxygen while Ambulating = See note below Please briefly explain why patient needs home oxygen: Pt's O2 sats at rest 93%.  O2 sats dropped to 81% sitting in bed.  HR increased to 116.  Pt is weak and stayed in bed during assessment.  2L O2 reapplied, O2 sats up to mid 90's.  -Place patient on 2 L O2 titrate to maintain SPO2> 93% -Provide Inogen portable home O2 oxygen generator   Length of Need: Lifetime   Mode or (Route): Nasal cannula   Liters per Minute: 2   Frequency: Continuous (stationary and portable oxygen unit needed)   Oxygen conserving device: Yes   Oxygen delivery system: Gas   Increase activity slowly   Complete by: As directed    Increase activity slowly   Complete by: As directed    Lifting restrictions   Complete by: As directed    Avoid lifting any heavy objects as pt is fall risk.     Allergies as of 04/27/2020   No Known Allergies     Medication List    STOP taking these medications   aspirin 81 MG EC tablet   atorvastatin 20 MG tablet Commonly known as: LIPITOR   isosorbide mononitrate 30 MG 24 hr tablet Commonly known as: IMDUR   losartan 25 MG tablet Commonly known as: COZAAR   metoprolol succinate 25 MG 24 hr tablet Commonly known as: TOPROL-XL     TAKE these medications   albuterol 108 (90 Base) MCG/ACT inhaler Commonly known as: VENTOLIN HFA Inhale 1-2 puffs into the lungs every  4 (four) hours as needed for  wheezing or shortness of breath.   dexamethasone 6 MG tablet Commonly known as: Decadron Take 1 tablet (6 mg total) by mouth daily for 10 days.   Digoxin 62.5 MCG Tabs Take 0.0625 mg by mouth daily.   guaiFENesin-dextromethorphan 100-10 MG/5ML syrup Commonly known as: ROBITUSSIN DM Take 10 mLs by mouth every 4 (four) hours as needed for cough.   hydroxyurea 500 MG capsule Commonly known as: Hydrea Take 1 capsule (500 mg total) by mouth 2 (two) times daily. May take with food to minimize GI side effects.   Ipratropium-Albuterol 20-100 MCG/ACT Aers respimat Commonly known as: COMBIVENT Inhale 1 puff into the lungs every 6 (six) hours.   metoprolol tartrate 25 MG tablet Commonly known as: LOPRESSOR Take 0.5 tablets (12.5 mg total) by mouth 2 (two) times daily.   pantoprazole 40 MG tablet Commonly known as: PROTONIX Take 1 tablet (40 mg total) by mouth 2 (two) times daily.   tamsulosin 0.4 MG Caps capsule Commonly known as: FLOMAX Take 0.4 mg by mouth daily after supper.            Durable Medical Equipment  (From admission, onward)         Start     Ordered   04/27/20 1717  DME Oxygen  Once       Comments: 2 L with ambulation.  Question Answer Comment  Length of Need 6 Months   Liters per Minute 2   Oxygen delivery system Gas      04/27/20 1728   04/23/20 0000  For home use only DME oxygen       Comments: SATURATION QUALIFICATIONS: (This note is used to comply with regulatory documentation for home oxygen) Patient Saturations on Room Air at Rest = 92% Patient Saturations on Room Air while Ambulating = Please see the note below. Patient Saturations on 2 Liters of oxygen while Ambulating = See note below Please briefly explain why patient needs home oxygen: Pt's O2 sats at rest 93%.  O2 sats dropped to 81% sitting in bed.  HR increased to 116.  Pt is weak and stayed in bed during assessment.  2L O2 reapplied, O2 sats up to mid 90's.   -Place patient on 2 L O2 titrate to maintain SPO2> 93% -Provide Inogen portable home O2 oxygen generator  Question Answer Comment  Length of Need Lifetime   Mode or (Route) Nasal cannula   Liters per Minute 2   Frequency Continuous (stationary and portable oxygen unit needed)   Oxygen conserving device Yes   Oxygen delivery system Gas      04/23/20 1700         No Known Allergies  Follow-up Information    Ottie Glazier, MD. Schedule an appointment as soon as possible for a visit in 3 week(s).   Specialty: Pulmonary Disease Why: Schedule follow-up appointment with Clinton. Zetta Bills pulmonologist who evaluated patient in hospital.  Patient will require biopsy and per note IGRA/quantify for now as well as other etiology including fungal.  Contact information: [place] [address] [phone]        Teodoro Spray, MD Follow up in 1 week(s).   Specialty: Cardiology Contact information: [place] [address] [phone]                The results of significant diagnostics from this hospitalization (including imaging, microbiology, ancillary and laboratory) are listed below for reference.    Significant Diagnostic Studies: DG Chest 2  View  Result Date: 04/17/2020 CLINICAL DATA:  Chest pain, COVID positive EXAM: CHEST - 2 VIEW COMPARISON:  None FINDINGS: Multifocal opacities bilaterally. Possible loculated right pleural effusion along the lateral chest wall extending to the apex. Right paratracheal prominence. No pneumothorax. Heart size is within normal limits. Evidence of prior CABG. No acute osseous abnormality. IMPRESSION: Multifocal pulmonary opacities suspicious for pneumonia. Possible loculated right pleural effusion extending to the apex. Electronically Signed   By: Macy Mis M.D.   On: 04/17/2020 10:49   CT Chest Wo Contrast  Result Date: 04/17/2020 CLINICAL DATA:  COVID-19 positive last week, presents  with worsening shortness of breath, possible effusion EXAM: CT CHEST WITHOUT CONTRAST TECHNIQUE: Multidetector CT imaging of the chest was performed following the standard protocol without IV contrast. COMPARISON:  Radiograph 04/17/2020 FINDINGS: Cardiovascular: Borderline cardiomegaly. Three-vessel coronary artery disease. Calcifications of the aortic leaflets. Atherosclerotic plaque within the thoracic aorta. Ascending thoracic aortic dilatation to 4.1 cm central pulmonary arteries are upper limits normal caliber. Luminal evaluation of vasculature precluded in the absence of contrast media. Mediastinum/Nodes: Multiple borderline enlarged mediastinal and hilar nodes with additional more conglomerate, infiltrative soft tissue attenuation in the right hilum extending to the paramediastinal border, margins of which are difficult to fully ascertain in the absence of contrast media no. Several larger index nodes in the mediastinum include a 10 mm prevascular lymph node (2/40), 10 mm AP window lymph node (2/44), and a 11 mm precarinal lymph node (2/44). Resulting in narrowing and segmental occlusion of several of the mid to lower lung airways on the right. Additional flattening of the lower trachea and airways, could reflect some degree of tracheal malacia. Secretions noted in the lower airways as well. Thoracic esophagus with a small hiatal hernia. No other acute abnormality. Thyroid gland with few punctate calcifications. No discrete nodules. Lungs/Pleura: Widespread multifocal areas of mixed interstitial, ground-glass and consolidative opacities with a peripheral and basilar predominance. There are additional areas of more spiculated masslike opacity present in the left upper lobe (3/31) measuring up to 2.4 x 2.3 cm in size. A separate spiculated masslike nodule seen in the left apex as well measuring 1.6 x 1.6 cm in size (3/19). Furthermore, the previously suspected effusion in the right lung demonstrates some more  intermediate attenuation with lobular margins superiorly possibly reflecting a pleural based lesion given contiguity with the mediastinal and hilar soft tissue attenuation posteriorly (2/25). Additional nodular pleural thickening is seen in the left lung as well (2/45). Small fluid attenuation of fusions are noted more inferiorly bilaterally. Upper Abdomen: Indeterminate possible cyst arising in the upper right renal fossa (2/147). Indeterminate nodule of the left adrenal gland measuring up to 1.6 cm (2/138). Musculoskeletal: Postsurgical changes from prior sternotomy with bony fusion across the sternal plate manubrium. Intact sternal sutures. Multilevel degenerative changes in the spine. Somewhat indeterminate sclerotic focus present in the left sixth rib laterally (5/52). Additional mixed sclerotic and lucent lesion seen T11 spinous process as well (6/81). IMPRESSION: 1. Widespread multifocal areas of mixed interstitial, ground-glass and consolidative opacities with a peripheral and basilar predominance, with a peripheral and basilar predominance. Findings are favored to represent multifocal pneumonia in the setting of COVID-19 positivity. 2. Bilateral spiculated masslike opacities in both upper lobes as well as more nodular intermediate to soft tissue attenuation pleural thickening in the upper lungs including a region contiguous with more diffuse conglomerate in infiltrative soft tissue attenuation along the right paramediastinal margin concerning for underlying malignancy/metastatic disease. 3. Additional mediastinal nodes concerning  for metastatic adenopathy. 4. Indeterminate nodule of the left adrenal gland measuring up to 1.6 cm in size, concerning for metastatic disease. 5. Somewhat indeterminate sclerotic focus in the left sixth rib laterally. Additional mixed sclerotic and lucent lesion seen in the T11 spinous process as well. Could reflect osseous metastatic disease. 6. Indeterminate 1 cm mass in the  right renal fossa. Possible cyst from the upper pole right kidney. 7. Aortic Atherosclerosis (ICD10-I70.0). 8. Ascending aortic dilatation to 4.1 cm. Recommend annual imaging followup by CTA or MRA. This recommendation follows 2010 ACCF/AHA/AATS/ACR/ASA/SCA/SCAI/SIR/STS/SVM Guidelines for the Diagnosis and Management of Patients with Thoracic Aortic Disease. Circulation. 2010; 121: N361-W431. Aortic aneurysm NOS (ICD10-I71.9) 9. Coronary artery calcifications are present. Please note that the presence of coronary artery calcium documents the presence of coronary artery disease, the severity of this disease and any potential stenosis cannot be assessed on this non-gated CT examination. Assessment for potential risk factor modification, dietary therapy or pharmacologic therapy may be warranted. These results were called by telephone at the time of interpretation on 04/17/2020 at 4:23 pm to provider [place]. Cherylann Banas, who verbally acknowledged these results. Electronically Signed   By: Lovena Le M.D.   On: 04/17/2020 16:24   MR THORACIC SPINE W WO CONTRAST  Result Date: 04/23/2020 CLINICAL DATA:  Cough and shortness of breath. Spiculated nodules in adenopathy on chest CT. EXAM: MRI THORACIC AND LUMBAR SPINE WITHOUT AND WITH CONTRAST TECHNIQUE: Multiplanar and multiecho pulse sequences of the thoracic and lumbar spine were obtained without and with intravenous contrast. CONTRAST:  7.76mL GADAVIST GADOBUTROL 1 MMOL/ML IV SOLN COMPARISON:  Chest CT 04/17/2020 FINDINGS: MRI THORACIC SPINE FINDINGS Alignment:  Normal Vertebrae: Sclerotic focus in the T11 spinous process without contrast enhancement. Bone marrow signal otherwise normal. Cord:  Normal signal and morphology. Paraspinal and other soft tissues: Pleural effusions with right medial loculation. Incompletely visualized spiculated areas of distortion in the right upper lobe. Disc levels: No spinal canal or neural foraminal stenosis. MRI LUMBAR SPINE FINDINGS  Segmentation:  Standard. Alignment:  Physiologic. Vertebrae:  No fracture, evidence of discitis, or bone lesion. Conus medullaris: Extends to the L1 level and appears normal. Paraspinal and other soft tissues: Multiple fluid collections within the psoas and iliacus muscles, right greater than left. Disc levels: L1-2: Disc desiccation without stenosis. L2-3: Mild disc bulge without spinal canal stenosis. Mild bilateral foraminal narrowing. L3-4: Left asymmetric intermediate sized disc bulge. Mild spinal canal stenosis and mild left foraminal stenosis. L4-5: Intermediate disc bulge with no spinal canal stenosis. Mild right foraminal stenosis. L5-S1: Mild disc bulge and mild facet hypertrophy. No spinal canal stenosis. No neural foraminal stenosis. IMPRESSION: 1. No evidence of metastatic disease to the thoracic or lumbar spine. 2. Multiple fluid collections within the psoas and iliacus muscles, right greater than left, consistent with intramuscular hematomas. Dedicated imaging of this region is recommended. 3. Pleural effusions. 4. Mild multilevel lumbar degenerative disc disease with mild spinal canal stenosis at L3-4. Electronically Signed   By: Ulyses Jarred M.D.   On: 04/23/2020 02:08   MR Lumbar Spine W Wo Contrast  Result Date: 04/23/2020 : CLINICAL DATA: Cough and shortness of breath. Spiculated nodules in adenopathy on chest CT. EXAM: MRI THORACIC AND LUMBAR SPINE WITHOUT AND WITH CONTRAST TECHNIQUE: Multiplanar and multiecho pulse sequences of the thoracic and lumbar spine were obtained without and with intravenous contrast. CONTRAST: 7.25mL GADAVIST GADOBUTROL 1 MMOL/ML IV SOLN COMPARISON: Chest CT 04/17/2020 FINDINGS: MRI THORACIC SPINE FINDINGS Alignment: Normal Vertebrae: Sclerotic focus in the  T11 spinous process without contrast enhancement. Bone marrow signal otherwise normal. Cord: Normal signal and morphology. Paraspinal and other soft tissues: Pleural effusions with right medial loculation.  Incompletely visualized spiculated areas of distortion in the right upper lobe. Disc levels: No spinal canal or neural foraminal stenosis. MRI LUMBAR SPINE FINDINGS Segmentation: Standard. Alignment: Physiologic. Vertebrae: No fracture, evidence of discitis, or bone lesion. Conus medullaris: Extends to the L1 level and appears normal. Paraspinal and other soft tissues: Multiple fluid collections within the psoas and iliacus muscles, right greater than left. Disc levels: L1-2: Disc desiccation without stenosis. L2-3: Mild disc bulge without spinal canal stenosis. Mild bilateral foraminal narrowing. L3-4: Left asymmetric intermediate sized disc bulge. Mild spinal canal stenosis and mild left foraminal stenosis. L4-5: Intermediate disc bulge with no spinal canal stenosis. Mild right foraminal stenosis. L5-S1: Mild disc bulge and mild facet hypertrophy. No spinal canal stenosis. No neural foraminal stenosis. IMPRESSION: 1. No evidence of metastatic disease to the thoracic or lumbar spine. 2. Multiple fluid collections within the psoas and iliacus muscles, right greater than left, consistent with intramuscular hematomas. Dedicated imaging of this region is recommended. 3. Pleural effusions. 4. Mild multilevel lumbar degenerative disc disease with mild spinal canal stenosis at L3-4. Electronically Signed   By: Ulyses Jarred M.D.   On: 04/23/2020 02:12   MR PELVIS W WO CONTRAST  Result Date: 04/23/2020 CLINICAL DATA:  Thrombocytosis, malaise, cough, possible thoracic malignancy EXAM: MRI PELVIS WITHOUT AND WITH CONTRAST TECHNIQUE: Multiplanar multisequence MR imaging of the pelvis was performed both before and after administration of intravenous contrast. CONTRAST:  7.52mL GADAVIST GADOBUTROL 1 MMOL/ML IV SOLN COMPARISON:  CT abdomen/pelvis dated 09/20/2017 FINDINGS: Urinary Tract:  Bladder is within normal limits. Bowel:  Visualized bowel is grossly unremarkable. Vascular/Lymphatic: No evidence of aneurysm. No  suspicious pelvic lymphadenopathy. Reproductive:  Prostate is unremarkable. Other: Intramuscular hematomas within the bilateral psoas and iliacus muscles, right greater than left, incompletely visualized. Largest axial measurement 7.2 x 7.4 cm on the right. Associated layering retroperitoneal fluid bilaterally. Layering fluid-fluid level in the dependent pelvis. Musculoskeletal: Body wall edema.  No focal osseous lesions. IMPRESSION: Intramuscular hematomas within the bilateral psoas and iliacus muscles, right greater than left, incompletely visualized. Correlate with laboratory evaluation if patient is anticoagulated. Electronically Signed   By: Julian Hy M.D.   On: 04/23/2020 04:18   CT ABDOMEN PELVIS W CONTRAST  Result Date: 04/27/2020 CLINICAL DATA:  Follow-up retroperitoneal hematoma EXAM: CT ABDOMEN AND PELVIS WITH CONTRAST TECHNIQUE: Multidetector CT imaging of the abdomen and pelvis was performed using the standard protocol following bolus administration of intravenous contrast. CONTRAST:  39mL OMNIPAQUE IOHEXOL 300 MG/ML  SOLN COMPARISON:  04/24/2020, 04/23/2020 FINDINGS: Lower chest: Small bilateral pleural effusions are similar to prior. Patchy ground-glass and airspace opacities within the visualized lung bases are slightly progressed from prior. Hepatobiliary: Densely calcified gallstones are again noted within a contracted gallbladder. Liver is within normal limits. No biliary dilatation. Pancreas: Unchanged appearance of the pancreas with abnormality at the splenic hilum/distal pancreatic tail, as described below. Spleen: Redemonstration of irregular low density soft tissue within the splenic hilum measuring approximately 3.9 x 3.9 cm trans axially (series 2, image 16), unchanged from priors. This density also abuts the distal most aspect of the pancreatic tail. Adrenals/Urinary Tract: Unchanged nodular thickening of the left adrenal gland. Unremarkable right adrenal gland. Bilateral renal  cysts. Kidneys are unchanged in appearance. Mild prominence of the left renal pelvis, similar to prior without hydronephrosis. Mild circumferential urinary bladder  wall thickening. Stomach/Bowel: Moderate hiatal hernia. Bowel is well distended with oral contrast. No dilated loops of bowel to suggest obstruction. Scattered colonic diverticulosis. No focal colonic thickening or inflammatory changes. Vascular/Lymphatic: Aortic atherosclerosis. No enlarged abdominal or pelvic lymph nodes. Reproductive: Prostate is unremarkable. Other: Extensive bilateral retroperitoneal hematomas with involvement of the bilateral psoas and iliacus muscles. Right-sided hematoma measures approximately 10.0 x 7.2 cm trans axially (series 2, image 41), previously approximately 10.2 x 8.6 cm. Left-sided hematoma does not appear appreciably changed. No new/progressive hemorrhage. No pneumoperitoneum. Musculoskeletal: No new or acute osseous findings. IMPRESSION: 1. Extensive bilateral retroperitoneal hematomas are stable to slightly decreased in size compared to prior. No evidence of new/progressive hemorrhage. 2. Unchanged appearance of irregular density at the splenic hilum with differential remaining perisplenic hematoma or infiltrative neoplasm. Continued attention on follow-up is recommended. 3. Patchy ground-glass and airspace opacities within the visualized lung bases are slightly progressed from prior study. Findings may represent worsening pneumonia and/or superimposed edema. 4. Small bilateral pleural effusions are similar to prior. 5. Additional chronic/incidental findings, as above. Electronically Signed   By: Davina Poke D.O.   On: 04/27/2020 16:14   CT ABDOMEN PELVIS W CONTRAST  Result Date: 04/24/2020 CLINICAL DATA:  Abdominal pain with distension. Retroperitoneal hematoma. COVID pneumonia. EXAM: CT ABDOMEN AND PELVIS WITH CONTRAST TECHNIQUE: Multidetector CT imaging of the abdomen and pelvis was performed using the  standard protocol following bolus administration of intravenous contrast. CONTRAST:  61mL OMNIPAQUE IOHEXOL 300 MG/ML  SOLN COMPARISON:  CT abdomen/pelvis dated 04/23/2020. MR pelvis dated 04/23/2020. CT chest dated 04/17/2020. FINDINGS: Lower chest: Subpleural patchy opacities in the bilateral lower lobes, corresponding to the patient's known COVID pneumonia. Small bilateral pleural effusions, unchanged. Right basilar atelectasis. No: Bilateral upper lobes are not included on this study. However, when correlating with the recent prior CT chest, the irregular right upper lobe nodule is partially calcified and favors an infectious/inflammatory etiology, possibly TB. In that context, the left upper lobe pulmonary nodule may be related to TB, although hamartoma or primary bronchogenic neoplasm are also possible. Regardless, this is unlikely to be related to the patient's presenting symptoms, although this still warrants follow-up. Hepatobiliary: Liver is within normal limits. Densely calcified gallstones with a contracted gallbladder. No associated inflammatory changes. No intrahepatic or extrahepatic duct dilatation. Pancreas: Within normal limits, noting abnormality along the pancreatic tail/splenic hilum, described below. Spleen: Abnormal soft tissue in the splenic hilum which encases the splenic vessels (coronal image 39). This measures 3.9 x 3.9 cm in maximal axial dimension (series 2/image 19), unchanged. There is a mildly irregular appearance centrally within the spleen (coronal image 41), which does raise the possibility of splenic hematoma in this clinical context, but there is no evidence of active extravasation. Infiltrating neoplasm such as perisplenic lymphoma could also have this appearance, but that is considered unlikely given the additional findings. Adrenals/Urinary Tract: Mild nodular thickening of the left adrenal gland (series 2/image 23). Right adrenal glands are within normal limits. Bilateral  renal cysts, including a dominant 2.3 cm cyst in the right upper pole (series 2/image 25). No hydronephrosis. Excretory contrast in the bladder, which is underdistended. Stomach/Bowel: Stomach is notable for a moderate hiatal hernia. No evidence of bowel obstruction. Normal appendix (series 2/image 61). Mild left colonic diverticulosis, without evidence of diverticulitis. Vascular/Lymphatic: No evidence of abdominal aortic aneurysm. Atherosclerotic calcifications of the abdominal aorta and branch vessels. No suspicious abdominopelvic lymphadenopathy. Reproductive: Prostate is unremarkable. Other: Retroperitoneal hematomas involving the bilateral iliopsoas/iliacus musculature. As noted  on the prior, greatest axillary extent is on the right, currently measuring 8.6 x 10.2 cm (series 2/image 40), previously 8.7 x 9.9 cm, unchanged. However, the greatest craniocaudal extent is on the left. There are a few tiny foci of possible extravasation remaining inferiorly/posteriorly on the right (series 2/image 45), with disbursement on delayed imaging (series 7/image 33), although this has largely resolved since the prior CT. No new/progressive hemorrhage. Musculoskeletal: Degenerative changes of the visualized thoracolumbar spine. Stable sclerotic lesion in the posterior spinous process at T11 (sagittal image 60). IMPRESSION: Retroperitoneal hematomas involving the bilateral iliopsoas/iliacus musculature, as described above, overall grossly unchanged. While a few tiny foci of possible extravasation persist on the right, this has largely resolved since the prior CT. No new/progressive hemorrhage. Suspected perisplenic hematoma, unchanged, without active extravasation. Given this finding, and in this clinical context, the underlying diagnosis of COVID-19 associated vasculitis should be considered. Please note that infiltrating neoplasm such as perisplenic lymphoma could also have this appearance, but is considered unlikely given  the other findings. Bilateral lower lobe pneumonia in this patient with known COVID-19. Small bilateral pleural effusions, unchanged. Review of prior upper lobe nodules on prior chest CT raise the possibility that this may be related to prior infection/inflammation. Specifically, the right upper lobe nodule favors sequela of prior TB. Left upper lobe nodule could reflect TB, benign hamartoma, or possibly neoplasm, warranting 3 month follow-up after resolution of symptoms. These results were called by telephone at the time of interpretation on 04/24/2020 at 10:40 am to provider [facility] , who verbally acknowledged these results. Electronically Signed   By: Julian Hy M.D.   On: 04/24/2020 10:46   CT ABDOMEN PELVIS W CONTRAST  Result Date: 04/23/2020 CLINICAL DATA:  Retroperitoneal hematoma, follow-up. Abdominal pain with distension. COVID positive. EXAM: CT ABDOMEN AND PELVIS WITH CONTRAST TECHNIQUE: Multidetector CT imaging of the abdomen and pelvis was performed using the standard protocol following bolus administration of intravenous contrast. CONTRAST:  148mL OMNIPAQUE IOHEXOL 300 MG/ML  SOLN COMPARISON:  CT abdomen dated 08/20/2017. MRI lumbar spine from earlier same day. FINDINGS: Lower chest: Bibasilar pleural effusions. Mixed consolidations and ground-glass opacities at the bilateral lung bases, corresponding to the chest CT findings of 04/17/2020 suggesting multifocal pneumonia. Hepatobiliary: Gallbladder is filled with stones. No pericholecystic inflammation or other signs of acute cholecystitis. Common bile duct appears stable in caliber compared to the earlier CT of 08/20/2017. No focal mass or lesion is seen within the liver. Pancreas: Unremarkable. No pancreatic ductal dilatation or surrounding inflammatory changes. Spleen: Hypodense collection within the splenic hilum, measuring 4.9 x 4 cm, most likely acute blood products, suspected active hemorrhage/extravasation at the upper margin of  the collection (anterior margin of the spleen). Adrenals/Urinary Tract: Adrenal glands appear normal. Bilateral renal cysts. No renal stone or hydronephrosis. Bladder is partially decompressed, containing hyperdense material which most likely represents IV contrast excretion. Stomach/Bowel: No dilated large or small bowel loops. No evidence of bowel wall inflammation. Stomach is unremarkable, partially decompressed. Vascular/Lymphatic: Aortic atherosclerosis. No enlarged lymph nodes seen. Reproductive: Prostate is unremarkable. Other: Complex mixed-density collections are present within the bilateral iliopsoas musculature, compatible with the intramuscular hematomas described on earlier MRI exams, RIGHT perhaps slightly greater overall size/thickness than on the LEFT, but more craniocaudal extension within the LEFT iliac muscle to the LEFT upper thigh. These hematomas have not significantly changed in the short-term interval. Small amount of free fluid in the pelvis. Musculoskeletal: No acute appearing osseous abnormality. Ill-defined fluid/edema within the subcutaneous soft  tissues indicating anasarca. IMPRESSION: 1. Hypodense collection within the splenic hilum, measuring 4.9 x 4 cm, compatible with acute hematoma. Questionable focal active hemorrhage/extravasation at the upper margin of this perisplenic collection, but not convincing. Recommend short-term follow-up CT to ensure stability. 2. Large intramuscular hematomas involving the bilateral iliopsoas musculature, as described on today's earlier MRI exams and not significantly changed in the short-term interval. 3. Active hemorrhage/contrast extravasation is identified within the RIGHT iliopsoas hematoma (series 2, images 42 through 45; series 7, images 28 and 29). Recommend short-term follow-up CT to ensure resolution. 4. Bibasilar pleural effusions. Mixed consolidations and ground-glass opacities at the bilateral lung bases, corresponding to the chest CT  findings of 04/17/2020 suggesting multifocal pneumonia. 5. Cholelithiasis without evidence of acute cholecystitis. 6. Small amount of free fluid in the pelvis. 7. Anasarca. Aortic Atherosclerosis (ICD10-I70.0). These results and recommendations were called by telephone at the time of interpretation on 04/23/2020 at 2:31 pm to provider [place]. Sherral Hammers, who verbally acknowledged these results. Electronically Signed   By: Franki Cabot M.D.   On: 04/23/2020 14:39   DG Chest Port 1 View  Result Date: 04/22/2020 CLINICAL DATA:  Unresponsive this morning.  COVID positive EXAM: PORTABLE CHEST 1 VIEW COMPARISON:  Five days ago FINDINGS: Patchy bilateral pneumonia with similar distribution but likely decreasing density. Normal heart size. 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 No visible effusion or air leak. Hyperinflation in the setting of COPD. Calcification and pleural thickening asymmetric at the right apex. IMPRESSION: Bilateral pneumonia with mildly improved aeration. Reference recent chest CT concerning the findings of possible intrathoracic malignancy. Electronically Signed   By: Monte Fantasia M.D.   On: 04/22/2020 09:09   ECHOCARDIOGRAM COMPLETE  Result Date: 04/18/2020    ECHOCARDIOGRAM REPORT   Patient Name:   DARWIN ROTHLISBERGER Date of Exam: 04/18/2020 Medical Rec #:  [id number]         Height:       62.0 in Accession #:    [id number]        Weight:       114.0 lb Date of Birth:  [birth year]          BSA:          1.505 m Patient Age:    61 years          BP:           130/89 mmHg Patient Gender: M                 HR:           113 bpm. Exam Location:  [facility] Procedure: 2D Echo, Color Doppler and Cardiac Doppler Indications:     I48.91 Atrial fibrillation  History:         Patient has no prior history of Echocardiogram examinations.                  Risk Factors:Hypertension. Pt tested positive for COVID-19 on                  04/17/2020.  Sonographer:     Charmayne Sheer RDCS (AE) Referring Phys:  [id number] [place] Diagnosing  Phys: Ida Rogue MD IMPRESSIONS  1. Left ventricular ejection fraction, by estimation, is 55 %. The left ventricle has normal function. The left ventricle has no regional wall motion abnormalities. Left ventricular diastolic parameters are indeterminate.  2. Right ventricular systolic function is normal. The right ventricular size is normal. There is moderately elevated pulmonary artery systolic  pressure. The estimated right ventricular systolic pressure is 91.4 mmHg.  3. The inferior vena cava is dilated in size with <50% respiratory variability, suggesting right atrial pressure of 15 mmHg. FINDINGS  Left Ventricle: Left ventricular ejection fraction, by estimation, is 55 to 60%. The left ventricle has normal function. The left ventricle has no regional wall motion abnormalities. The left ventricular internal cavity size was normal in size. There is  no left ventricular hypertrophy. Left ventricular diastolic parameters are indeterminate. Right Ventricle: The right ventricular size is normal. No increase in right ventricular wall thickness. Right ventricular systolic function is normal. There is moderately elevated pulmonary artery systolic pressure. The tricuspid regurgitant velocity is 3.19 m/s, and with an assumed right atrial pressure of 10 mmHg, the estimated right ventricular systolic pressure is 78.2 mmHg. Left Atrium: Left atrial size was normal in size. Right Atrium: Right atrial size was normal in size. Pericardium: A small pericardial effusion is present. Mitral Valve: The mitral valve is normal in structure. Normal mobility of the mitral valve leaflets. No evidence of mitral valve regurgitation. No evidence of mitral valve stenosis. MV peak gradient, 8.1 mmHg. The mean mitral valve gradient is 3.0 mmHg. Tricuspid Valve: The tricuspid valve is normal in structure. Tricuspid valve regurgitation is mild . No evidence of tricuspid stenosis. Aortic Valve: The aortic valve was not well visualized. Aortic  valve regurgitation is not visualized. Mild to moderate aortic valve sclerosis/calcification is present, without any evidence of aortic stenosis. Aortic valve mean gradient measures 6.0 mmHg.  Aortic valve peak gradient measures 10.9 mmHg. Aortic valve area, by VTI measures 1.67 cm. Pulmonic Valve: The pulmonic valve was normal in structure. Pulmonic valve regurgitation is not visualized. No evidence of pulmonic stenosis. Aorta: The aortic root is normal in size and structure. Venous: The inferior vena cava is dilated in size with less than 50% respiratory variability, suggesting right atrial pressure of 15 mmHg. IAS/Shunts: No atrial level shunt detected by color flow Doppler.  LEFT VENTRICLE PLAX 2D LVIDd:         4.57 cm  Diastology LVIDs:         3.36 cm  LV e' lateral:   8.59 cm/s LV PW:         0.90 cm  LV E/e' lateral: 16.1 LV IVS:        0.66 cm  LV e' medial:    7.83 cm/s LVOT diam:     2.10 cm  LV E/e' medial:  17.7 LV SV:         42 LV SV Index:   28 LVOT Area:     3.46 cm  RIGHT VENTRICLE RV Basal diam:  3.32 cm LEFT ATRIUM             Index       RIGHT ATRIUM           Index LA diam:        3.30 cm 2.19 cm/m  RA Area:     16.30 cm LA Vol (A2C):   31.0 ml 20.59 ml/m RA Volume:   45.30 ml  30.09 ml/m LA Vol (A4C):   40.0 ml 26.57 ml/m LA Biplane Vol: 35.7 ml 23.72 ml/m  AORTIC VALVE                    PULMONIC VALVE AV Area (Vmax):    1.74 cm     PV Vmax:       1.06 m/s AV Area (Vmean):  1.64 cm     PV Vmean:      61.600 cm/s AV Area (VTI):     1.67 cm     PV VTI:        0.131 m AV Vmax:           165.00 cm/s  PV Peak grad:  4.5 mmHg AV Vmean:          108.000 cm/s PV Mean grad:  2.0 mmHg AV VTI:            0.249 m AV Peak Grad:      10.9 mmHg AV Mean Grad:      6.0 mmHg LVOT Vmax:         83.00 cm/s LVOT Vmean:        51.200 cm/s LVOT VTI:          0.120 m LVOT/AV VTI ratio: 0.48  AORTA Ao Root diam: 3.10 cm MITRAL VALVE                TRICUSPID VALVE MV Area (PHT): 4.37 cm     TR Peak grad:    40.7 mmHg MV Peak grad:  8.1 mmHg     TR Vmax:        319.00 cm/s MV Mean grad:  3.0 mmHg MV Vmax:       1.42 m/s     SHUNTS MV Vmean:      81.1 cm/s    Systemic VTI:  0.12 m MV Decel Time: 174 msec     Systemic Diam: 2.10 cm MV E velocity: 138.67 cm/s Ida Rogue MD Electronically signed by Ida Rogue MD Signature Date/Time: 04/18/2020/11:01:31 AM    Final     Microbiology: Recent Results (from the past 240 hour(s))  Blood Culture (routine x 2)     Status: None   Collection Time: 04/17/20  7:08 PM   Specimen: BLOOD  Result Value Ref Range Status   Specimen Description BLOOD RIGHT ANTECUBITAL  Final   Special Requests   Final    BOTTLES DRAWN AEROBIC AND ANAEROBIC Blood Culture adequate volume   Culture   Final    NO GROWTH 5 DAYS Performed at [facility], [address] [place],  [address]    Report Status 04/22/2020 FINAL  Final  Aspergillus Ag, BAL/Serum     Status: None   Collection Time: 04/24/20  9:58 AM   Specimen: Vein; Blood  Result Value Ref Range Status   Aspergillus Ag, BAL/Serum 0.12 0.00 - 0.49 Index Final    Comment: (NOTE) Performed At: [facility] [place], [place] [id number] Rush Farmer MD OI:[id number]      Labs: Basic Metabolic Panel: Recent Labs  Lab 04/24/20 0304 04/24/20 0957 04/25/20 1054 04/26/20 0614 04/27/20 0507  NA 130* 131* 134* 133* 133*  K 4.7 4.4 4.6 4.4 5.1  CL 98 99 100 98 97*  CO2 22 24 26 27 29   GLUCOSE 135* 128* 142* 147* 148*  BUN 67* 63* 46* 37* 30*  CREATININE 1.67* 1.30* 0.93 0.89 0.97  CALCIUM 8.2* 7.9* 8.2* 8.1* 8.3*  MG 2.3 2.3 2.6* 2.3 2.4  PHOS 4.8* 4.7* 3.6 3.5 3.6   Liver Function Tests: Recent Labs  Lab 04/24/20 0304 04/24/20 0957 04/25/20 1054 04/26/20 0614 04/27/20 0507  AST 41 39 37 31 29  ALT 25 24 27 27 28   ALKPHOS 54 53 54 59 59  BILITOT 1.6* 1.5* 2.1* 2.1* 2.2*  PROT 5.2* 5.0* 5.2*  4.9* 5.0*  ALBUMIN 3.0* 2.8* 2.8* 2.7* 2.8*   No results for  input(s): LIPASE, AMYLASE in the last 168 hours. No results for input(s): AMMONIA in the last 168 hours. CBC: Recent Labs  Lab 04/24/20 0304 04/24/20 0304 04/24/20 0957 04/24/20 0957 04/24/20 1612 04/24/20 2239 04/25/20 1054 04/26/20 0614 04/27/20 0507  WBC 29.3*  --  26.0*  --   --   --  25.8* 23.6* 24.0*  NEUTROABS 22.0*  --  18.9*  --   --   --  20.8* 19.0* 19.5*  HGB 5.8*   < > 7.8*   < > 9.7* 8.9* 9.3* 9.1* 9.2*  HCT 17.1*   < > 22.4*   < > 28.2* 24.9* 25.6* 25.9* 27.0*  MCV 95.5  --  92.6  --   --   --  89.5 90.9 95.7  PLT 793*  --  680*  --   --   --  618* 559* 566*   < > = values in this interval not displayed.    CBG: Recent Labs  Lab 04/24/20 0139  GLUCAP 117*   Signed:  Para Skeans MD.  [facility] 04/27/2020, 5:47 PM

## 2020-04-27 NOTE — Discharge Instructions (Signed)
PLEASE F/U ON ALL DOCTOR APPTS. PLEASE HAVE HEMOGLOBIN CHECK WEEKLY FOR NEXT FEW WEEKS AT DISCRETION OF PRIMARY CARE TO ASSESS HB IS RISING AND NOT [place].  PLEASE NOTE REPEAT CT SCAN SHOWED LARGE HEMATOMAS BUT NOW NEW BLEEDING AND ALSO THEY ARE GETTING SMALLER.  PLEASE NOTE TO AVOID ANY NSAIDS OR BLOOD THINNER AS IT MAY PRECIPITATE OR POTENTATE BLEEDING.  PLEASE SEEK MEDICAL CARE FOR ANY NEW SYMPTOM OF CHEST PAIN SOB OR ANY SYMPTOMS THAT IS NOT PATIENTS BASELINE. PLEASE FOLLOW UP WITH SPECIALIST APPT. PLEASE NOTE PT NEEDS OXYGEN 2 L WHILE AMBULATING DUE TO COVID-19 PNEUMONIA.  PLEASE QUARANTINE YOURSELF FOR NEXT TWO WEEKS.  PLEASE REMEMBER DIGOXIN STARTED FOR ATRIAL FIBRILLATION. PLEASE NOTE PT IS AT HIG RISK FOR STROKE AND TO SEEK MEDICAL CARE AS SOON AS POSSIBLE FOR ANY STROKE LIKE SYMPTOMS.  10 Things You Can Do to Manage Your COVID-19 Symptoms at Home If you have possible or confirmed COVID-19: 1. Stay home from work and school. And stay away from other public places. If you must go out, avoid using any kind of public transportation, ridesharing, or taxis. 2. Monitor your symptoms carefully. If your symptoms get worse, call your healthcare provider immediately. 3. Get rest and stay hydrated. 4. If you have a medical appointment, call the healthcare provider ahead of time and tell them that you have or may have COVID-19. 5. For medical emergencies, call 911 and notify the dispatch personnel that you have or may have COVID-19. 6. Cover your cough and sneezes with a tissue or use the inside of your elbow. 7. Wash your hands often with soap and water for at least 20 seconds or clean your hands with an alcohol-based hand sanitizer that contains at least 60% alcohol. 8. As much as possible, stay in a specific room and away from other people in your home. Also, you should use a separate bathroom, if available. If you need to be around other people in or outside of the home, wear a mask. 9. Avoid  sharing personal items with other people in your household, like dishes, towels, and bedding. 10. Clean all surfaces that are touched often, like counters, tabletops, and doorknobs. Use household cleaning sprays or wipes according to the label instructions. [email or web address] 02/17/2019 This information is not intended to replace advice given to you by your health care provider. Make sure you discuss any questions you have with your health care provider. Document Revised: 07/22/2019 Document Reviewed: 07/22/2019 Elsevier Patient Education  [place].   COVID-19 Frequently Asked Questions COVID-19 (coronavirus disease) is an infection that is caused by a large family of viruses. Some viruses cause illness in people and others cause illness in animals like camels, cats, and bats. In some cases, the viruses that cause illness in animals can spread to humans. Where did the coronavirus come from? In December 2019, [place] told the [facility]) of several cases of lung disease (human respiratory illness). These cases were linked to an open seafood and livestock market in the city of [place]. The link to the seafood and livestock market suggests that the virus may have spread from animals to humans. However, since that first outbreak in December, the virus has also been shown to spread from person to person. What is the name of the disease and the virus? Disease name Early on, this disease was called novel coronavirus. This is because scientists determined that the disease was caused by a new (novel) respiratory virus. The [facility] (  WHO) has now named the disease COVID-19, or coronavirus disease. Virus name The virus that causes the disease is called severe acute respiratory syndrome coronavirus 2 (SARS-CoV-2). More information on disease and virus naming World Health Organization [facility]):  www.who.int/emergencies/diseases/novel-coronavirus-2019/technical-guidance/naming-the-coronavirus-disease-(covid-2019)-and-the-virus-that-causes-it Who is at risk for complications from coronavirus disease? Some people may be at higher risk for complications from coronavirus disease. This includes older adults and people who have chronic diseases, such as heart disease, diabetes, and lung disease. If you are at higher risk for complications, take these extra precautions:  Stay home as much as possible.  Avoid social gatherings and travel.  Avoid close contact with others. Stay at least 6 ft (2 m) away from others, if possible.  Wash your hands often with soap and water for at least 20 seconds.  Avoid touching your face, mouth, nose, or eyes.  Keep supplies on hand at home, such as food, medicine, and cleaning supplies.  If you must go out in public, wear a cloth face covering or face mask. Make sure your mask covers your nose and mouth. How does coronavirus disease spread? The virus that causes coronavirus disease spreads easily from person to person (is contagious). You may catch the virus by:  Breathing in droplets from an infected person. Droplets can be spread by a person breathing, speaking, singing, coughing, or sneezing.  Touching something, like a table or a doorknob, that was exposed to the virus (contaminated) and then touching your mouth, nose, or eyes. Can I get the virus from touching surfaces or objects? There is still a lot that we do not know about the virus that causes coronavirus disease. Scientists are basing a lot of information on what they know about similar viruses, such as:  Viruses cannot generally survive on surfaces for long. They need a human body (host) to survive.  It is more likely that the virus is spread by close contact with people who are sick (direct contact), such as through: ? Shaking hands or hugging. ? Breathing in respiratory droplets that  travel through the air. Droplets can be spread by a person breathing, speaking, singing, coughing, or sneezing.  It is less likely that the virus is spread when a person touches a surface or object that has the virus on it (indirect contact). The virus may be able to enter the body if the person touches a surface or object and then touches his or her face, eyes, nose, or mouth. Can a person spread the virus without having symptoms of the disease? It may be possible for the virus to spread before a person has symptoms of the disease, but this is most likely not the main way the virus is spreading. It is more likely for the virus to spread by being in close contact with people who are sick and breathing in the respiratory droplets spread by a person breathing, speaking, singing, coughing, or sneezing. What are the symptoms of coronavirus disease? Symptoms vary from person to person and can range from mild to severe. Symptoms may include:  Fever or chills.  Cough.  Difficulty breathing or feeling short of breath.  Headaches, body aches, or muscle aches.  Runny or stuffy (congested) nose.  Sore throat.  New loss of taste or smell.  Nausea, vomiting, or diarrhea. These symptoms can appear anywhere from 2 to 14 days after you have been exposed to the virus. Some people may not have any symptoms. If you develop symptoms, call your health care provider. People with severe symptoms  may need hospital care. Should I be tested for this virus? Your health care provider will decide whether to test you based on your symptoms, history of exposure, and your risk factors. How does a health care provider test for this virus? Health care providers will collect samples to send for testing. Samples may include:  Taking a swab of fluid from the back of your nose and throat, your nose, or your throat.  Taking fluid from the lungs by having you cough up mucus (sputum) into a sterile cup.  Taking a blood  sample. Is there a treatment or vaccine for this virus? Currently, there is no vaccine to prevent coronavirus disease. Also, there are no medicines like antibiotics or antivirals to treat the virus. A person who becomes sick is given supportive care, which means rest and fluids. A person may also relieve his or her symptoms by using over-the-counter medicines that treat sneezing, coughing, and runny nose. These are the same medicines that a person takes for the common cold. If you develop symptoms, call your health care provider. People with severe symptoms may need hospital care. What can I do to protect myself and my family from this virus?     You can protect yourself and your family by taking the same actions that you would take to prevent the spread of other viruses. Take the following actions:  Wash your hands often with soap and water for at least 20 seconds. If soap and water are not available, use alcohol-based hand sanitizer.  Avoid touching your face, mouth, nose, or eyes.  Cough or sneeze into a tissue, sleeve, or elbow. Do not cough or sneeze into your hand or the air. ? If you cough or sneeze into a tissue, throw it away immediately and wash your hands.  Disinfect objects and surfaces that you frequently touch every day.  Stay away from people who are sick.  Avoid going out in public, follow guidance from your state and local health authorities.  Avoid crowded indoor spaces. Stay at least 6 ft (2 m) away from others.  If you must go out in public, wear a cloth face covering or face mask. Make sure your mask covers your nose and mouth.  Stay home if you are sick, except to get medical care. Call your health care provider before you get medical care. Your health care provider will tell you how long to stay home.  Make sure your vaccines are up to date. Ask your health care provider what vaccines you need. What should I do if I need to travel? Follow travel recommendations  from your local health authority, the CDC, and WHO. Travel information and advice  Centers for Disease Control and Prevention (CDC): BodyEditor.hu  World Health Organization [facility]): ThirdIncome.ca Know the risks and take action to protect your health  You are at higher risk of getting coronavirus disease if you are traveling to areas with an outbreak or if you are exposed to travelers from areas with an outbreak.  Wash your hands often and practice good hygiene to lower the risk of catching or spreading the virus. What should I do if I am sick? General instructions to stop the spread of infection  Wash your hands often with soap and water for at least 20 seconds. If soap and water are not available, use alcohol-based hand sanitizer.  Cough or sneeze into a tissue, sleeve, or elbow. Do not cough or sneeze into your hand or the air.  If you  cough or sneeze into a tissue, throw it away immediately and wash your hands.  Stay home unless you must get medical care. Call your health care provider or local health authority before you get medical care.  Avoid public areas. Do not take public transportation, if possible.  If you can, wear a mask if you must go out of the house or if you are in close contact with someone who is not sick. Make sure your mask covers your nose and mouth. Keep your home clean  Disinfect objects and surfaces that are frequently touched every day. This may include: ? Counters and tables. ? Doorknobs and light switches. ? Sinks and faucets. ? Electronics such as phones, remote controls, keyboards, computers, and tablets.  Wash dishes in hot, soapy water or use a dishwasher. Air-dry your dishes.  Wash laundry in hot water. Prevent infecting other household members  Let healthy household members care for children and pets, if possible. If you have to care for children or  pets, wash your hands often and wear a mask.  Sleep in a different bedroom or bed, if possible.  Do not share personal items, such as razors, toothbrushes, deodorant, combs, brushes, towels, and washcloths. Where to find more information Centers for Disease Control and Prevention (CDC)  Information and news updates: https://www.butler-gonzalez.com/ World Health Organization [facility])  Information and news updates: MissExecutive.com.ee  Coronavirus health topic: https://www.castaneda.info/  Questions and answers on COVID-19: OpportunityDebt.at  Global tracker: who.sprinklr.com American Academy of Pediatrics (AAP)  Information for families: www.healthychildren.org/English/health-issues/conditions/chest-lungs/Pages/2019-Novel-Coronavirus.aspx The coronavirus situation is changing rapidly. Check your local health authority website or the CDC and [facility] websites for updates and news. When should I contact a health care provider?  Contact your health care provider if you have symptoms of an infection, such as fever or cough, and you: ? Have been near anyone who is known to have coronavirus disease. ? Have come into contact with a person who is suspected to have coronavirus disease. ? Have traveled to an area where there is an outbreak of COVID-19. When should I get emergency medical care?  Get help right away by calling your local emergency services (911 in the [place] if you have: ? Trouble breathing. ? Pain or pressure in your chest. ? Confusion. ? Blue-tinged lips and fingernails. ? Difficulty waking from sleep. ? Symptoms that get worse. Let the emergency medical personnel know if you think you have coronavirus disease. Summary  A new respiratory virus is spreading from person to person and causing COVID-19 (coronavirus disease).  The virus that causes COVID-19 appears to spread easily. It spreads from one  person to another through droplets from breathing, speaking, singing, coughing, or sneezing.  Older adults and those with chronic diseases are at higher risk of disease. If you are at higher risk for complications, take extra precautions.  There is currently no vaccine to prevent coronavirus disease. There are no medicines, such as antibiotics or antivirals, to treat the virus.  You can protect yourself and your family by washing your hands often, avoiding touching your face, and covering your coughs and sneezes. This information is not intended to replace advice given to you by your health care provider. Make sure you discuss any questions you have with your health care provider. Document Revised: 06/04/2019 Document Reviewed: 12/01/2018 Elsevier Patient Education  [place].  COVID-19: How to Protect Yourself and Others Know how it spreads  There is currently no vaccine to prevent coronavirus disease 2019 (COVID-19).  The  best way to prevent illness is to avoid being exposed to this virus.  The virus is thought to spread mainly from person-to-person. ? Between people who are in close contact with one another (within about 6 feet). ? Through respiratory droplets produced when an infected person coughs, sneezes or talks. ? These droplets can land in the mouths or noses of people who are nearby or possibly be inhaled into the lungs. ? COVID-19 may be spread by people who are not showing symptoms. Everyone should Clean your hands often  Wash your hands often with soap and water for at least 20 seconds especially after you have been in a public place, or after blowing your nose, coughing, or sneezing.  If soap and water are not readily available, use a hand sanitizer that contains at least 60% alcohol. Cover all surfaces of your hands and rub them together until they feel dry.  Avoid touching your eyes, nose, and mouth with unwashed hands. Avoid close contact  Limit contact  with others as much as possible.  Avoid close contact with people who are sick.  Put distance between yourself and other people. ? Remember that some people without symptoms may be able to spread virus. ? This is especially important for people who are at higher risk of getting very GainPain.com.cy Cover your mouth and nose with a mask when around others  You could spread COVID-19 to others even if you do not feel sick.  Everyone should wear a mask in public settings and when around people not living in their household, especially when social distancing is difficult to maintain. ? Masks should not be placed on young children under age 15, anyone who has trouble breathing, or is unconscious, incapacitated or otherwise unable to remove the mask without assistance.  The mask is meant to protect other people in case you are infected.  Do NOT use a facemask meant for a Dietitian.  Continue to keep about 6 feet between yourself and others. The mask is not a substitute for social distancing. Cover coughs and sneezes  Always cover your mouth and nose with a tissue when you cough or sneeze or use the inside of your elbow.  Throw used tissues in the trash.  Immediately wash your hands with soap and water for at least 20 seconds. If soap and water are not readily available, clean your hands with a hand sanitizer that contains at least 60% alcohol. Clean and disinfect  Clean AND disinfect frequently touched surfaces daily. This includes tables, doorknobs, light switches, countertops, handles, desks, phones, keyboards, toilets, faucets, and sinks. RackRewards.fr  If surfaces are dirty, clean them: Use detergent or soap and water prior to disinfection.  Then, use a household disinfectant. You can see a list of EPA-registered household disinfectants  here. michellinders.com 04/21/2019 This information is not intended to replace advice given to you by your health care provider. Make sure you discuss any questions you have with your health care provider. Document Revised: 04/29/2019 Document Reviewed: 02/25/2019 Elsevier Patient Education  [place].

## 2020-04-27 NOTE — TOC Transition Note (Signed)
Transition of Care [facility]) - CM/SW Discharge Note   Patient Details  Name: Clinton Gordon MRN: [id number] Date of Birth: 13-Mar-1930  Transition of Care [facility]) CM/SW Contact:  Shelbie Hutching, RN Phone Number: 04/27/2020, 3:11 PM   Clinical Narrative:    Patient is medically cleared for discharge home once the CT scan is completed and read.  Patient will be going home with his family, son [name] will come and pick the patient up and take him home.  Home Health services have been arranged through [facility], Sharmon Revere is aware of discharge today.  Patient has home O2 already delivered by [employer] if needed at home.  Bedside RN will call the family once the discharge has been completed and [name] will come and pick the patient up.    Final next level of care: Home w Home Health Services Barriers to Discharge: Barriers Resolved   Patient Goals and CMS Choice   CMS Medicare.gov Compare Post Acute Care list provided to:: Patient Represenative (must comment) Choice offered to / list presented to : Adult Children  Discharge Placement                Patient to be transferred to facility by: [facility] will provide transport home Name of family member notified: [name] Patient and family notified of of transfer: 04/27/20  Discharge Plan and Services   Discharge Planning Services: CM Consult Post Acute Care Choice: Home Health          DME Arranged: Oxygen DME Agency: Other - Comment [facility]) Date DME Agency Contacted: 04/23/20 Time DME Agency Contacted: [phone] Representative spoke with at DME Agency: [facility]on [facility] Arranged: RN, PT, OT [facility] Agency: [place] Date [place]: 04/27/20 Time [place]: 61 Representative spoke with at [place]: [place] ([place]) Interventions     Readmission Risk Interventions No flowsheet data found.

## 2020-04-27 NOTE — Progress Notes (Signed)
Pt discharged home at this time, discharge reviewed with son via telephone, states understanding, o2 brought from home, placed on o2 for discharge, pt with no complaints

## 2020-04-27 NOTE — Progress Notes (Signed)
SATURATION QUALIFICATIONS: (This note is used to comply with regulatory documentation for home oxygen)  Patient Saturations on Room Air at Rest = 92%  Patient Saturations on Room Air while Ambulating = 83%  Patient Saturations on 3 Liters of oxygen while Ambulating = 90%

## 2020-04-27 NOTE — Progress Notes (Signed)
Son given daily update

## 2020-04-28 LAB — BPAM RBC
Blood Product Expiration Date: 202109072359
Blood Product Expiration Date: 202110062359
Blood Product Expiration Date: 202110062359
Blood Product Expiration Date: 202110072359
ISSUE DATE / TIME: 202109060437
ISSUE DATE / TIME: 202109061118
ISSUE DATE / TIME: 202109061241
Unit Type and Rh: 5100
Unit Type and Rh: 5100
Unit Type and Rh: 5100
Unit Type and Rh: 9500

## 2020-04-28 LAB — TYPE AND SCREEN
ABO/RH(D): O POS
Antibody Screen: NEGATIVE
Unit division: 0
Unit division: 0
Unit division: 0
Unit division: 0

## 2020-04-28 LAB — PREPARE RBC (CROSSMATCH)

## 2020-06-21 ENCOUNTER — Other Ambulatory Visit: Payer: Self-pay | Admitting: Internal Medicine

## 2020-06-21 DIAGNOSIS — J984 Other disorders of lung: Secondary | ICD-10-CM

## 2020-06-21 DIAGNOSIS — Z8701 Personal history of pneumonia (recurrent): Secondary | ICD-10-CM

## 2020-07-04 ENCOUNTER — Encounter: Payer: Self-pay | Admitting: Internal Medicine

## 2020-07-04 ENCOUNTER — Inpatient Hospital Stay: Payer: Medicare Other | Attending: Internal Medicine

## 2020-07-04 ENCOUNTER — Other Ambulatory Visit: Payer: Self-pay

## 2020-07-04 ENCOUNTER — Inpatient Hospital Stay (HOSPITAL_BASED_OUTPATIENT_CLINIC_OR_DEPARTMENT_OTHER): Payer: Medicare Other | Admitting: Internal Medicine

## 2020-07-04 VITALS — BP 152/77 | HR 83 | Temp 98.3°F | Resp 16 | Ht 62.0 in | Wt 139.2 lb

## 2020-07-04 DIAGNOSIS — D473 Essential (hemorrhagic) thrombocythemia: Secondary | ICD-10-CM | POA: Diagnosis not present

## 2020-07-04 DIAGNOSIS — R0602 Shortness of breath: Secondary | ICD-10-CM | POA: Insufficient documentation

## 2020-07-04 DIAGNOSIS — D72829 Elevated white blood cell count, unspecified: Secondary | ICD-10-CM | POA: Diagnosis not present

## 2020-07-04 DIAGNOSIS — Z79899 Other long term (current) drug therapy: Secondary | ICD-10-CM | POA: Diagnosis not present

## 2020-07-04 DIAGNOSIS — I4891 Unspecified atrial fibrillation: Secondary | ICD-10-CM | POA: Insufficient documentation

## 2020-07-04 DIAGNOSIS — D500 Iron deficiency anemia secondary to blood loss (chronic): Secondary | ICD-10-CM | POA: Insufficient documentation

## 2020-07-04 DIAGNOSIS — Z87442 Personal history of urinary calculi: Secondary | ICD-10-CM | POA: Insufficient documentation

## 2020-07-04 DIAGNOSIS — I10 Essential (primary) hypertension: Secondary | ICD-10-CM | POA: Insufficient documentation

## 2020-07-04 DIAGNOSIS — Z8611 Personal history of tuberculosis: Secondary | ICD-10-CM | POA: Diagnosis not present

## 2020-07-04 DIAGNOSIS — I2510 Atherosclerotic heart disease of native coronary artery without angina pectoris: Secondary | ICD-10-CM | POA: Insufficient documentation

## 2020-07-04 LAB — COMPREHENSIVE METABOLIC PANEL
ALT: 19 U/L (ref 0–44)
AST: 27 U/L (ref 15–41)
Albumin: 3.6 g/dL (ref 3.5–5.0)
Alkaline Phosphatase: 80 U/L (ref 38–126)
Anion gap: 11 (ref 5–15)
BUN: 10 mg/dL (ref 8–23)
CO2: 27 mmol/L (ref 22–32)
Calcium: 9.3 mg/dL (ref 8.9–10.3)
Chloride: 101 mmol/L (ref 98–111)
Creatinine, Ser: 0.83 mg/dL (ref 0.61–1.24)
GFR, Estimated: >60 mL/min (ref >60)
Glucose, Bld: 110 mg/dL — ABNORMAL HIGH (ref 70–99)
Potassium: 4.6 mmol/L (ref 3.5–5.1)
Sodium: 139 mmol/L (ref 135–145)
Total Bilirubin: 1.1 mg/dL (ref 0.3–1.2)
Total Protein: 6.8 g/dL (ref 6.5–8.1)

## 2020-07-04 LAB — CBC WITH DIFFERENTIAL/PLATELET
Abs Immature Granulocytes: 0.1 10*3/uL — ABNORMAL HIGH (ref 0.00–0.07)
Basophils Absolute: 0.2 10*3/uL — ABNORMAL HIGH (ref 0.0–0.1)
Basophils Relative: 1 %
Eosinophils Absolute: 0.2 10*3/uL (ref 0.0–0.5)
Eosinophils Relative: 1 %
HCT: 36.2 % — ABNORMAL LOW (ref 39.0–52.0)
Hemoglobin: 11.7 g/dL — ABNORMAL LOW (ref 13.0–17.0)
Immature Granulocytes: 1 %
Lymphocytes Relative: 11 %
Lymphs Abs: 2.2 10*3/uL (ref 0.7–4.0)
MCH: 36 pg — ABNORMAL HIGH (ref 26.0–34.0)
MCHC: 32.3 g/dL (ref 30.0–36.0)
MCV: 111.4 fL — ABNORMAL HIGH (ref 80.0–100.0)
Monocytes Absolute: 2 10*3/uL — ABNORMAL HIGH (ref 0.1–1.0)
Monocytes Relative: 11 %
Neutro Abs: 14.7 10*3/uL — ABNORMAL HIGH (ref 1.7–7.7)
Neutrophils Relative %: 75 %
Platelets: 1043 10*3/uL (ref 150–400)
RBC: 3.25 MIL/uL — ABNORMAL LOW (ref 4.22–5.81)
RDW: 20.3 % — ABNORMAL HIGH (ref 11.5–15.5)
WBC: 19.4 10*3/uL — ABNORMAL HIGH (ref 4.0–10.5)
nRBC: 0 % (ref 0.0–0.2)

## 2020-07-04 LAB — LACTATE DEHYDROGENASE: LDH: 421 U/L — ABNORMAL HIGH (ref 98–192)

## 2020-07-04 MED ORDER — ALBUTEROL SULFATE HFA 108 (90 BASE) MCG/ACT IN AERS: 1.0000 | INHALATION_SPRAY | Freq: Four times a day (QID) | RESPIRATORY_TRACT | 3 refills | Status: DC | PRN

## 2020-07-04 NOTE — Progress Notes (Signed)
Critical plt count 1043 called to Dr. Rogue Bussing by Reche Dixon in cancer center lab. Read back process performed with [id number]/[id number]

## 2020-07-04 NOTE — Assessment & Plan Note (Addendum)
#   Essential thrombocytosis-JAK-2 positive--platelets 600-700; range since 2017; fairly normal hemoglobin; white count.  High risk for thrombosis-given age/JAK2 mutation; STOPPED hydrea [sec to anemia/fatigue/ nail discoloration ]  #Today platelet count > 1 million [suspect likely secondary to reactive-recent Covid/blood loss]. HOLD hydrea given pt preference.  We will recheck again in 1 month.  Plan starting Hydrea at that time.  Continue aspirin for now.  #Anemia-blood loss/hematoma [while on eliquis/A.fib; currently stopped]-hemoglobin is 11.7.  Monitor closely.  #Leukocytosis with predominant neutrophilia-again likely reactive; do not suspect any acute infection at this time./Recent Covid/steroids [currently none].  Will check peripheral blood flow cytometry.  # shortness of breath/? wheezing[Hx of TB]- recommend continued inhaler/ new refill.  Chest x-ray with Dr. Ginette Pitman recent.  If not improved- refer to Dr.Hande.    I spoke at length with the patient/daughter-in-law- regarding the patient's clinical status/plan of care.  Family agreement.    # DISPOSITION: # follow up in 1 months MD;- cbc/cmp/LDH/CRP/check peripheral blood flowcytometry- Dr.B  Cc; Dr.Hande

## 2020-07-04 NOTE — Progress Notes (Signed)
[place] NOTE  Patient Care Team: Tracie Harrier, MD as PCP - General (Internal Medicine)  CHIEF COMPLAINTS/PURPOSE OF CONSULTATION: Thrombocytosis  HEMATOLOGY HISTORY   Oncology History Overview Note  # ESSENTIAL THROMBOCYTOSIS [platelets-600-700 since 2017 ; Hb-13; white count- 12]; 2019- CT-NEG for splenomegaly; June 2021-JAK-2 positive; Hydrea 500 mg BID; declines bone marrow biopsy;   # SEP 2021-admission Covid; acute thigh hematoma [on eliquis; a.fib]; Eliquis discontinued  #Lung lesion-?  Prior history of TB/question malignancy [Dr.Aleskerov]  # HTN; CAD- S/p CABG 2003; BPH   Essential thrombocythemia ([place])  01/10/2020 Initial Diagnosis   Essential thrombocythemia ([place])    HISTORY OF PRESENTING ILLNESS:  Clinton Gordon 86 y.o.  male essential thrombocytosis on Hydrea is here for follow-up.  In the interim patient was admitted to the hospital in [facility] Covid pneumonia.  Patient had a complicated hospital stay-which included poorly controlled A. Fib; thigh hematoma needing discontinuation of A. Fib; also 2 units of PRBC transfusion.  Patient also needed monoclonal antibody therapy.  Interestingly patient was vaccinated x2 prior.  Also noted to have lung nodule-prior history of TB versus others.  post discharge patient started back on Hydrea-however feels poorly has discontinued.  Complains of shortness of breath on exertion.  No fever no chills.  No nausea no vomiting.  Review of Systems  Constitutional: Positive for malaise/fatigue and weight loss. Negative for chills, diaphoresis and fever.  HENT: Negative for nosebleeds and sore throat.   Eyes: Negative for double vision.  Respiratory: Positive for shortness of breath. Negative for cough, hemoptysis and wheezing.   Cardiovascular: Negative for chest pain, palpitations, orthopnea and leg swelling.  Gastrointestinal: Negative for abdominal pain, blood in stool, constipation, diarrhea,  heartburn, melena, nausea and vomiting.  Genitourinary: Negative for dysuria, frequency and urgency.  Musculoskeletal: Negative for back pain and joint pain.  Skin: Negative.  Negative for itching and rash.  Neurological: Negative for dizziness, tingling, focal weakness, weakness and headaches.  Endo/Heme/Allergies: Does not bruise/bleed easily.  Psychiatric/Behavioral: Negative for depression. The patient is not nervous/anxious and does not have insomnia.      MEDICAL HISTORY:  Past Medical History:  Diagnosis Date  . Hypertension   . Kidney stones     SURGICAL HISTORY: Past Surgical History:  Procedure Laterality Date  . CARDIAC SURGERY      SOCIAL HISTORY: Social History   Socioeconomic History  . Marital status: Married    Spouse name: Not on file  . Number of children: Not on file  . Years of education: Not on file  . Highest education level: Not on file  Occupational History  . Not on file  Tobacco Use  . Smoking status: Never Smoker  . Smokeless tobacco: Never Used  Vaping Use  . Vaping Use: Never used  Substance and Sexual Activity  . Alcohol use: No  . Drug use: Not on file  . Sexual activity: Not on file  Other Topics Concern  . Not on file  Social History Narrative   Retd. 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    Social Determinants of Health   Financial Resource Strain:   . Difficulty of Paying Living Expenses: Not on file  Food Insecurity:   . Worried About Charity fundraiser in the Last Year: Not on file  . Ran Out of Food in the Last Year: Not on file  Transportation Needs:   . Lack of Transportation (Medical): Not on  file  . Lack of Transportation (Non-Medical): Not on file  Physical Activity:   . Days of Exercise per Week: Not on file  . Minutes of Exercise per Session: Not on file  Stress:   . Feeling of Stress : Not on file  Social Connections:   . Frequency of Communication with Friends and  Family: Not on file  . Frequency of Social Gatherings with Friends and Family: Not on file  . Attends Religious Services: Not on file  . Active Member of Clubs or Organizations: Not on file  . Attends Archivist Meetings: Not on file  . Marital Status: Not on file  Intimate Partner Violence:   . Fear of Current or Ex-Partner: Not on file  . Emotionally Abused: Not on file  . Physically Abused: Not on file  . Sexually Abused: Not on file    FAMILY HISTORY: No family history on file.  ALLERGIES:  has No Known Allergies.  MEDICATIONS:  Current Outpatient Medications  Medication Sig Dispense Refill  . albuterol (VENTOLIN HFA) 108 (90 Base) MCG/ACT inhaler Inhale 1-2 puffs into the lungs every 6 (six) hours as needed for wheezing or shortness of breath. 6.7 g 3  . Digoxin 62.5 MCG TABS Take 0.0625 mg by mouth daily. 30 tablet 0  . Ipratropium-Albuterol (COMBIVENT) 20-100 MCG/ACT AERS respimat Inhale 1 puff into the lungs every 6 (six) hours. 4 g 0  . pantoprazole (PROTONIX) 40 MG tablet Take 1 tablet (40 mg total) by mouth 2 (two) times daily. 60 tablet 0  . tamsulosin (FLOMAX) 0.4 MG CAPS capsule Take 0.4 mg by mouth daily after supper.     . metoprolol tartrate (LOPRESSOR) 25 MG tablet Take 0.5 tablets (12.5 mg total) by mouth 2 (two) times daily. 30 tablet 0   No current facility-administered medications for this visit.     PHYSICAL EXAMINATION:   Vitals:   07/04/20 1323  BP: (!) 152/77  Pulse: 83  Resp: 16  Temp: 98.3 F (36.8 C)  SpO2: 100%   Filed Weights   07/04/20 1323  Weight: 139 lb 3.2 oz (63.1 kg)    Physical Exam Constitutional:      Comments: Accompanied by daughter. Walking independently.  HENT:     Head: Normocephalic and atraumatic.     Mouth/Throat:     Pharynx: No oropharyngeal exudate.  Eyes:     Pupils: Pupils are equal, round, and reactive to light.  Cardiovascular:     Rate and Rhythm: Normal rate and regular rhythm.  Pulmonary:      Effort: Pulmonary effort is normal. No respiratory distress.     Breath sounds: Normal breath sounds. No wheezing.  Abdominal:     General: Bowel sounds are normal. There is no distension.     Palpations: Abdomen is soft. There is no mass.     Tenderness: There is no abdominal tenderness. There is no guarding or rebound.  Musculoskeletal:        General: No tenderness. Normal range of motion.     Cervical back: Normal range of motion and neck supple.  Skin:    General: Skin is warm.  Neurological:     Mental Status: He is alert and oriented to person, place, and time.  Psychiatric:        Mood and Affect: Affect normal.     LABORATORY DATA:  I have reviewed the data as listed Lab Results  Component Value Date   WBC 19.4 (H) 07/04/2020  HGB 11.7 (L) 07/04/2020   HCT 36.2 (L) 07/04/2020   MCV 111.4 (H) 07/04/2020   PLT 1,043 (HH) 07/04/2020   Recent Labs    04/25/20 1054 04/25/20 1054 04/26/20 0614 04/27/20 0507 07/04/20 1308  NA 134*   < > 133* 133* 139  K 4.6   < > 4.4 5.1 4.6  CL 100   < > 98 97* 101  CO2 26   < > 27 29 27   GLUCOSE 142*   < > 147* 148* 110*  BUN 46*   < > 37* 30* 10  CREATININE 0.93   < > 0.89 0.97 0.83  CALCIUM 8.2*   < > 8.1* 8.3* 9.3  GFRNONAA >60   < > >60 >60 >60  GFRAA >60  --  >60 >60  --   PROT 5.2*   < > 4.9* 5.0* 6.8  ALBUMIN 2.8*   < > 2.7* 2.8* 3.6  AST 37   < > 31 29 27   ALT 27   < > 27 28 19   ALKPHOS 54   < > 59 59 80  BILITOT 2.1*   < > 2.1* 2.2* 1.1   < > = values in this interval not displayed.     No results found.  ASSESSMENT & PLAN:   Essential thrombocythemia ([place]) # Essential thrombocytosis-JAK-2 positive--platelets 600-700; range since 2017; fairly normal hemoglobin; white count.  High risk for thrombosis-given age/JAK2 mutation; STOPPED hydrea [sec to anemia/fatigue/ nail discoloration ]  #Today platelet count > 1 million [suspect likely secondary to reactive-recent Covid/blood loss]. HOLD hydrea given pt  preference.  We will recheck again in 1 month.  Plan starting Hydrea at that time.  Continue aspirin for now.  #Anemia-blood loss/hematoma [while on eliquis/A.fib; currently stopped]-hemoglobin is 11.7.  Monitor closely.  #Leukocytosis with predominant neutrophilia-again likely reactive; do not suspect any acute infection at this time./Recent Covid/steroids [currently none].  Will check peripheral blood flow cytometry.  # shortness of breath/? wheezing[Hx of TB]- recommend continued inhaler/ new refill.  Chest x-ray with Dr. Ginette Pitman recent.  If not improved- refer to Dr.Hande.    I spoke at length with the patient/daughter-in-law- regarding the patient's clinical status/plan of care.  Family agreement.    # DISPOSITION: # follow up in 1 months MD;- cbc/cmp/LDH/CRP/check peripheral blood flowcytometry- Dr.B  Cc; Dr.Hande   Cammie Sickle, MD 07/06/2020 8:24 AM

## 2020-07-12 ENCOUNTER — Other Ambulatory Visit: Payer: Self-pay | Admitting: Internal Medicine

## 2020-07-12 DIAGNOSIS — Z8701 Personal history of pneumonia (recurrent): Secondary | ICD-10-CM

## 2020-07-12 DIAGNOSIS — J984 Other disorders of lung: Secondary | ICD-10-CM

## 2020-07-17 ENCOUNTER — Other Ambulatory Visit: Payer: Self-pay

## 2020-07-17 ENCOUNTER — Ambulatory Visit
Admission: RE | Admit: 2020-07-17 | Discharge: 2020-07-17 | Disposition: A | Discharge status: Home / Self Care | Payer: Medicare Other | Source: Ambulatory Visit | Attending: Internal Medicine | Admitting: Internal Medicine

## 2020-07-17 DIAGNOSIS — J984 Other disorders of lung: Secondary | ICD-10-CM

## 2020-07-17 DIAGNOSIS — Z8701 Personal history of pneumonia (recurrent): Secondary | ICD-10-CM | POA: Diagnosis present

## 2020-07-17 IMAGING — CT CT CHEST W/ CM
2 of 3 series · 13 of 36 positions shown, 16 images · IV contrast (omnipaque)
Comparison: [DATE]

CLINICAL DATA: Follow-up LEFT upper lobe densities, prior abnormal
CT chest, shortness of breath and BILATERAL leg edema for 1 month,
post [C5] pneumonia in [DATE]; history hypertension,
coronary artery disease post CABG

EXAM:
CT CHEST WITH CONTRAST
TECHNIQUE: Multidetector CT imaging of the chest was performed during
intravenous contrast administration. Sagittal and coronal MPR images
reconstructed from axial data set.
CONTRAST:  75mL OMNIPAQUE IOHEXOL 300 MG/ML  SOLN IV

[Series 2: axial st · axial · 0.66mm/px · z∈[-524,-248]mm · 10 of 162 slices shown, 13 images]
[im 12/162  mediastinal]
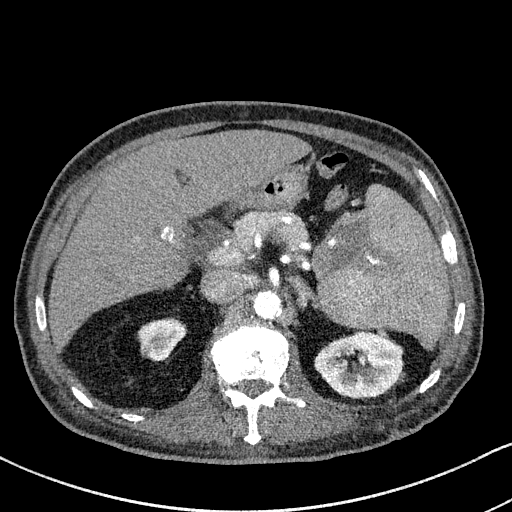
[im 12/162  lung]
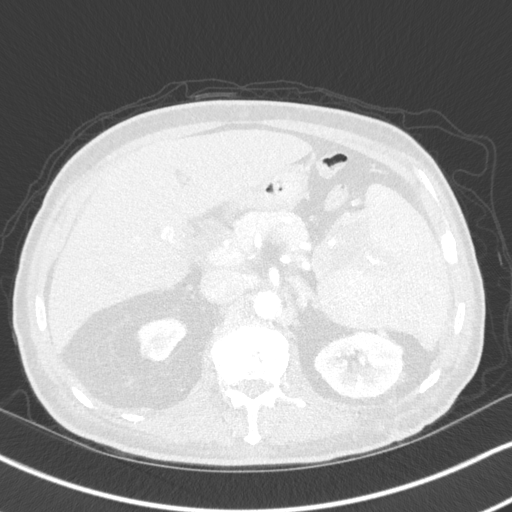
[im 24/162  lung]
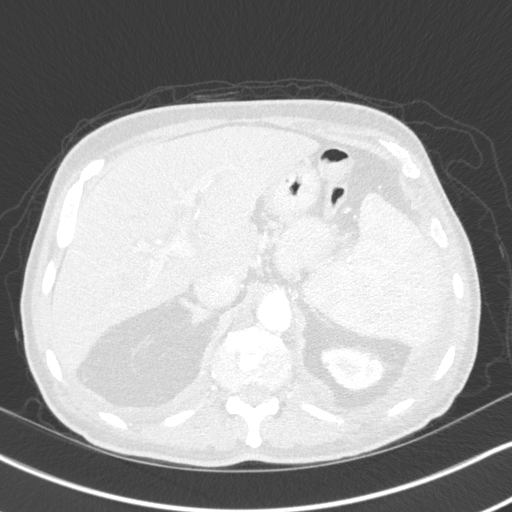
[im 42/162  lung]
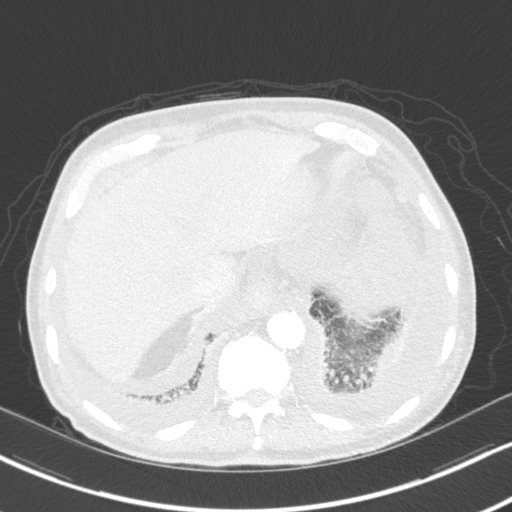
[im 60/162  lung]
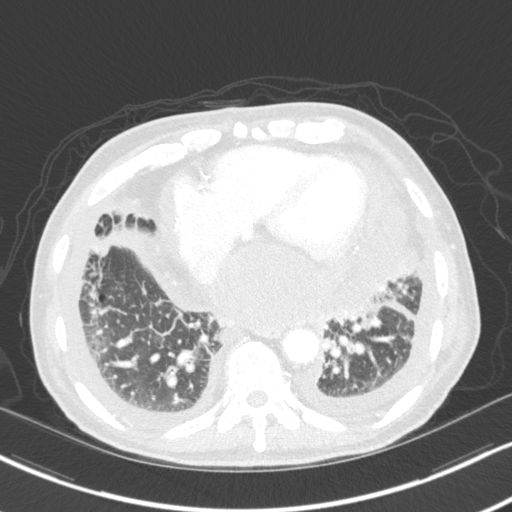
[im 72/162  mediastinal]
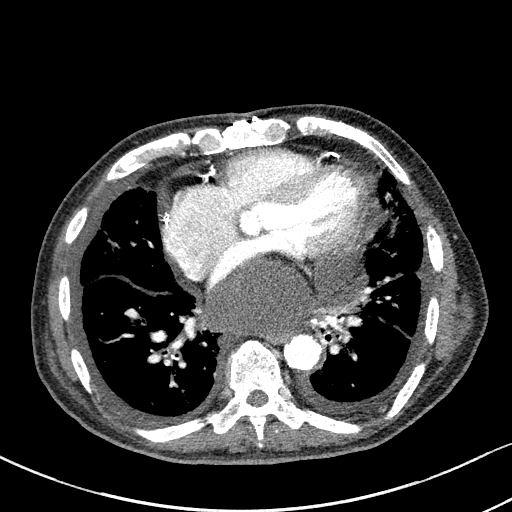
[im 72/162  lung]
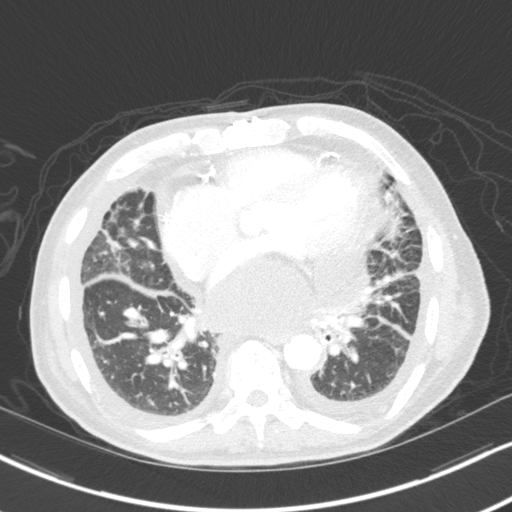
[im 90/162  lung]
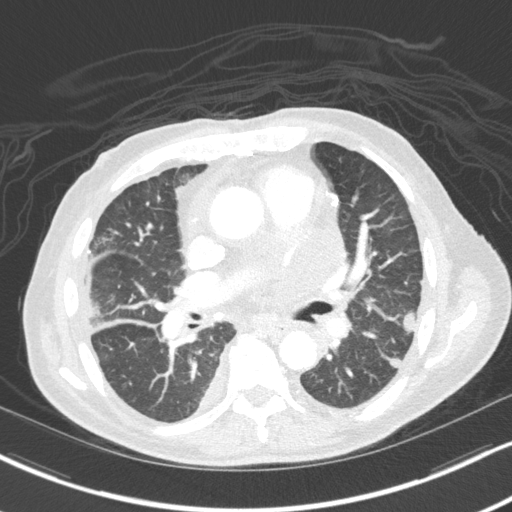
[im 102/162  lung]
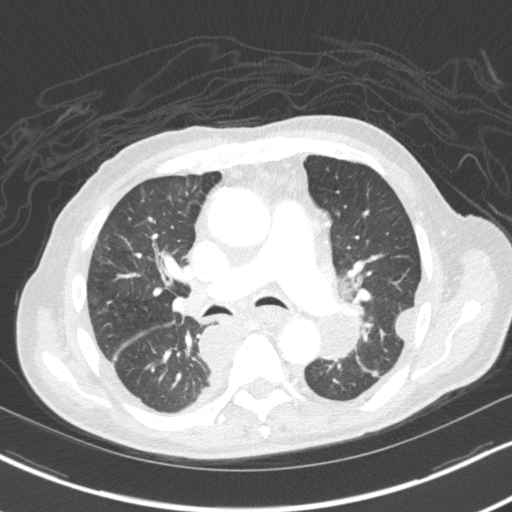
[im 120/162  lung]
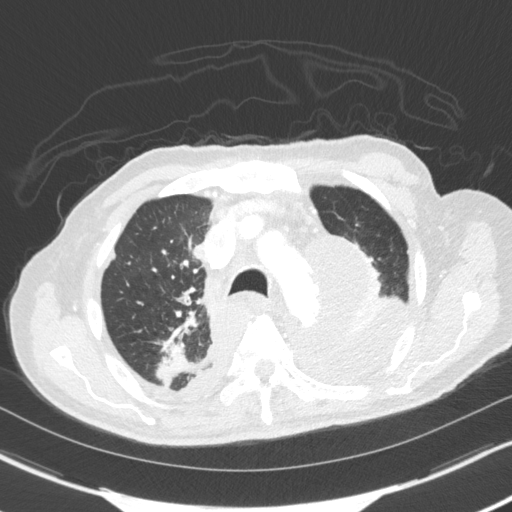
[im 138/162  mediastinal]
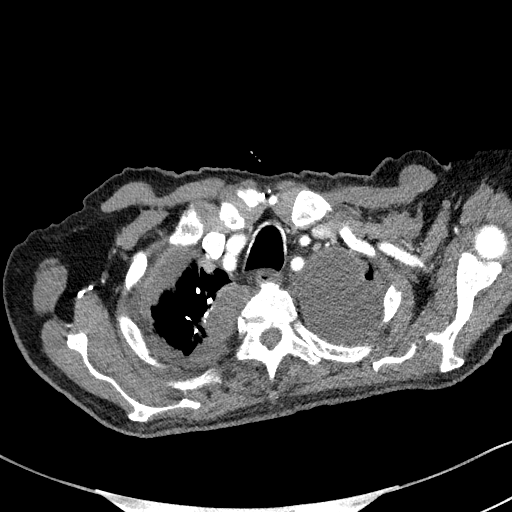
[im 138/162  lung]
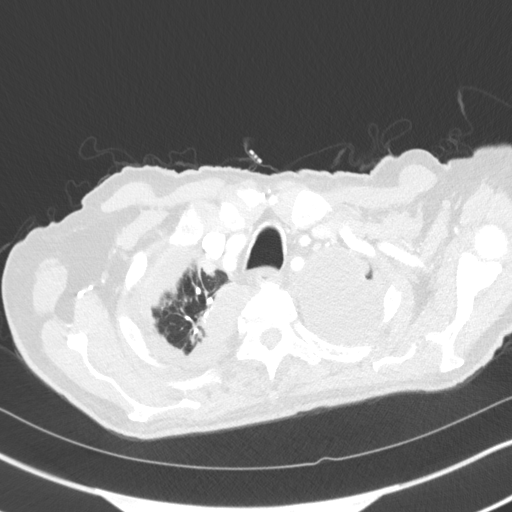
[im 150/162  lung]
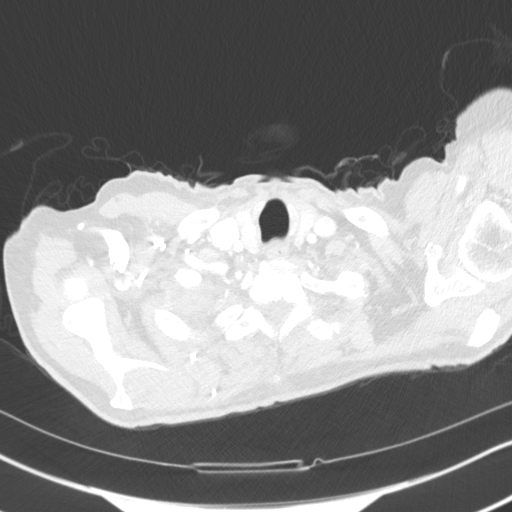

[Series 6: coronal · coronal · 0.66mm/px · 3 of 134 slices shown]
[im 27/134  lung]
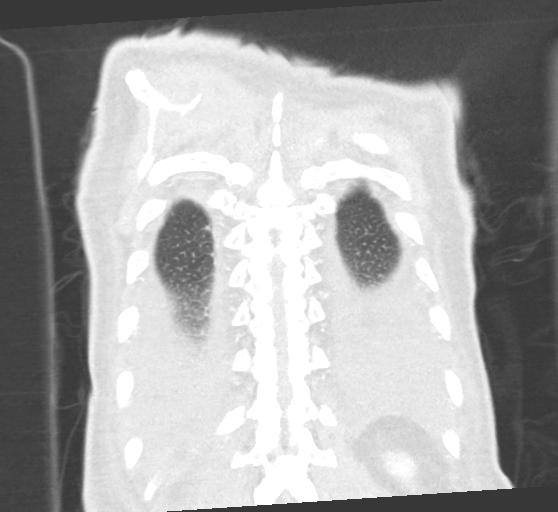
[im 54/134  lung]
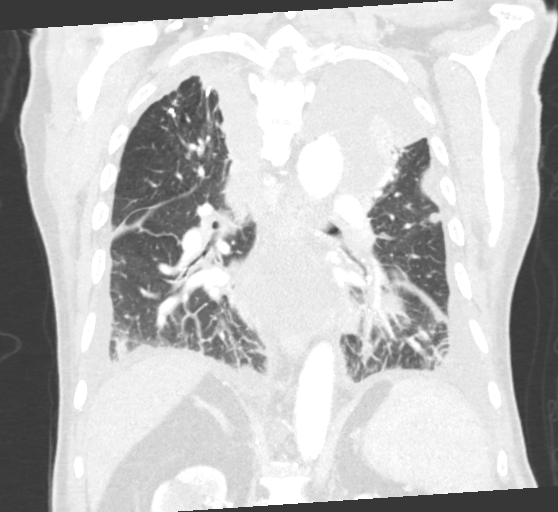
[im 80/134  lung]
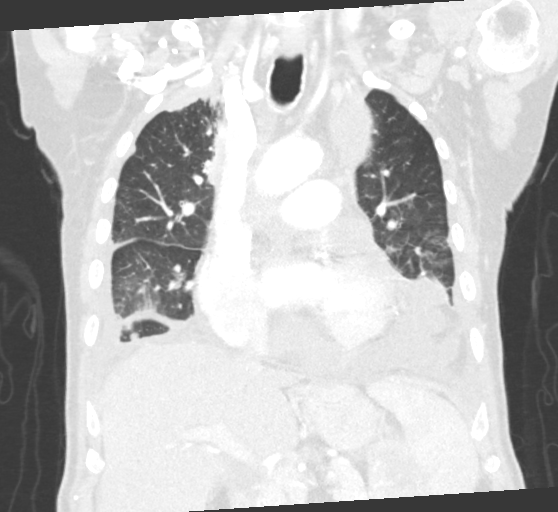

[13 of 36 positions shown; findings below may reference images not displayed]

FINDINGS: Cardiovascular: Atherosclerotic calcifications aorta, coronary
arteries, and proximal great vessels. Aneurysmal dilatation
ascending thoracic aorta 4.0 cm transverse image 63. Enlargement of
RIGHT atrium and RIGHT ventricle. Compression of LEFT atrium by a
loculated fluid collection 7.5 x 5.8 cm likely loculated pericardial
effusion. Additional loculated pericardial fluid collection
posterosuperior to the LEFT ventricle 4.7 x 3.6 cm. Slightly
prominent enhancement of the pericardium along the border adjacent
to the LEFT ventricle, see image 99.

Mediastinum/Nodes: Esophagus unremarkable. 6 mm RIGHT thyroid
nodule; Not clinically significant; no follow-up imaging
recommended. (ref: [HOSPITAL]. [DATE]): 143-50).12 mm
LEFT submandibular nodule likely lymph node image 1, incompletely
assessed. Mildly enlarged RIGHT hilar lymph node 11 mm short axis
image 60. Calcified RIGHT hilar and subcarinal nodes. Additional
scattered normal sized mediastinal lymph nodes. Stranding of
anterior mediastinal fat without discrete mass/node. No axillary
adenopathy.

Lungs/Pleura: Calcification and scarring at RIGHT apex likely old
granulomatous disease. Persistent spiculated posterior RIGHT upper
lobe nodule 2.9 x 3.0 cm. Central peribronchial thickening. Small
soft tissue nodules are seen at the major fissures bilaterally
likely tumor. Abnormal pleural based soft tissue at RIGHT apex
extending RIGHT paraspinal and extending to below the carina most
likely representing pleural based tumor and pleural carcinomatosis.
Partially loculated RIGHT pleural effusion. On the LEFT, small LEFT
pleural effusion is identified inferiorly with a larger fluid
collection loculated at the medial LEFT apex 8.5 x 6.1 cm, with
significant compression of the LEFT upper lobe. Minimal pleural
thickening dependently posterior LEFT hemithorax inferiorly cannot
exclude LEFT pleural carcinomatosis. Near complete resolution of
airspace infiltrates identified in both lungs on previous exam which
or most suggestive of COVID pneumonia. Scattered interseptal
thickening especially at lower lungs. Extensive peribronchial
thickening RIGHT middle and BILATERAL lower lobes. Scattered
fissural thickening bilaterally.

Upper Abdomen: Abnormal soft tissue mass 4.5 x 4.5 cm invading the
splenic hilum and extending to abut the pancreatic tail. Additional
enhancing splenic nodule 10 x 9 mm question metastasis. LEFT adrenal
nodule 1.6 x 1.3 cm not significantly changed. Cholelithiasis
including calculi at the gallbladder neck. Minimally enlarged
retrocrural node 11 mm. Thickening of RIGHT adrenal gland without
discrete mass. Small cysts at upper pole of RIGHT kidney. Thickened
gastric wall though this may be an artifact related to
underdistention.

Musculoskeletal: Few scattered dense sclerotic foci in thoracic
spine and LEFT sixth rib nonspecific, may represent small bone
islands or sclerotic metastases, unchanged. No destructive bone
lesions. Prior median sternotomy.
IMPRESSION: Again identified spiculated mass RIGHT upper lobe 2.9 x 3.0 cm
suspicious for malignancy.

Extensive RIGHT pleural carcinomatosis with RIGHT paraspinal pleural
based tumor and partially loculated RIGHT pleural effusion.

Probable LEFT pleural carcinomatosis as well with a few areas of
nodularity at the LEFT pleura and less extensive pleural thickening.

Loculated pleural effusion at LEFT apex with partial compressive
atelectasis of LEFT upper lobe.

Previously identified LEFT upper lobe nodule obscured secondary to
LEFT upper lobe atelectasis.

Loculated pericardial effusion including a 7.5 x 5.8 cm collection
compressing the LEFT atrium and an additional loculated 4.7 x 3.6 cm
collection superoposterior to LEFT ventricle.

New soft tissue mass invading splenic hilum and abutting spur
pancreatic tail.

LEFT adrenal metastasis.

Cholelithiasis.

Improved infiltrates of [C5] though progressive peribronchial
thickening and interseptal thickening are are identified in RIGHT
middle lobe and BILATERAL lower lobes.

Aneurysmal dilatation ascending thoracic aorta 4.0 cm transverse,
recommendation below.

Recommend annual imaging followup by CTA or MRA, if clinically
warranted based on patient comorbidities and age. This
recommendation follows [C5]
ACCF/AHA/AATS/ACR/ASA/SCA/ARODA/ARODA/ARODA/ARODA Guidelines for the
Diagnosis and Management of Patients with Thoracic Aortic Disease.
Circulation. [C5]; 121: [id number]. Aortic aneurysm NOS ([C5]-[C5])

Aortic Atherosclerosis ([C5]-[C5]) and Aortic aneurysm NOS
([C5]-[C5]).

These results will be called to the ordering clinician or
representative by the Radiologist Assistant, and communication
documented in the PACS or [REDACTED].

## 2020-07-17 MED ORDER — IOHEXOL 300 MG/ML  SOLN
75.0000 mL | Freq: Once | INTRAMUSCULAR | Status: AC | PRN
  Administered 2020-07-17: 75 mL via INTRAVENOUS

## 2020-08-02 ENCOUNTER — Other Ambulatory Visit: Payer: Self-pay

## 2020-08-02 ENCOUNTER — Inpatient Hospital Stay: Payer: Medicare Other | Attending: Internal Medicine

## 2020-08-02 ENCOUNTER — Inpatient Hospital Stay (HOSPITAL_BASED_OUTPATIENT_CLINIC_OR_DEPARTMENT_OTHER): Payer: Medicare Other | Admitting: Internal Medicine

## 2020-08-02 VITALS — BP 131/86 | HR 82 | Temp 96.2°F | Resp 20 | Ht 62.0 in | Wt 138.0 lb

## 2020-08-02 DIAGNOSIS — Z79899 Other long term (current) drug therapy: Secondary | ICD-10-CM | POA: Diagnosis not present

## 2020-08-02 DIAGNOSIS — I2510 Atherosclerotic heart disease of native coronary artery without angina pectoris: Secondary | ICD-10-CM | POA: Insufficient documentation

## 2020-08-02 DIAGNOSIS — I4891 Unspecified atrial fibrillation: Secondary | ICD-10-CM | POA: Diagnosis not present

## 2020-08-02 DIAGNOSIS — I10 Essential (primary) hypertension: Secondary | ICD-10-CM | POA: Insufficient documentation

## 2020-08-02 DIAGNOSIS — D72829 Elevated white blood cell count, unspecified: Secondary | ICD-10-CM | POA: Diagnosis not present

## 2020-08-02 DIAGNOSIS — I313 Pericardial effusion (noninflammatory): Secondary | ICD-10-CM | POA: Diagnosis not present

## 2020-08-02 DIAGNOSIS — N400 Enlarged prostate without lower urinary tract symptoms: Secondary | ICD-10-CM | POA: Diagnosis not present

## 2020-08-02 DIAGNOSIS — Z7901 Long term (current) use of anticoagulants: Secondary | ICD-10-CM | POA: Insufficient documentation

## 2020-08-02 DIAGNOSIS — Z87442 Personal history of urinary calculi: Secondary | ICD-10-CM | POA: Diagnosis not present

## 2020-08-02 DIAGNOSIS — R911 Solitary pulmonary nodule: Secondary | ICD-10-CM | POA: Diagnosis not present

## 2020-08-02 DIAGNOSIS — D473 Essential (hemorrhagic) thrombocythemia: Secondary | ICD-10-CM | POA: Diagnosis not present

## 2020-08-02 DIAGNOSIS — Z8616 Personal history of COVID-19: Secondary | ICD-10-CM | POA: Insufficient documentation

## 2020-08-02 DIAGNOSIS — J90 Pleural effusion, not elsewhere classified: Secondary | ICD-10-CM | POA: Insufficient documentation

## 2020-08-02 DIAGNOSIS — D500 Iron deficiency anemia secondary to blood loss (chronic): Secondary | ICD-10-CM | POA: Diagnosis not present

## 2020-08-02 DIAGNOSIS — I700 Atherosclerosis of aorta: Secondary | ICD-10-CM | POA: Insufficient documentation

## 2020-08-02 LAB — CBC WITH DIFFERENTIAL/PLATELET
Abs Immature Granulocytes: 0.12 10*3/uL — ABNORMAL HIGH (ref 0.00–0.07)
Basophils Absolute: 0.2 10*3/uL — ABNORMAL HIGH (ref 0.0–0.1)
Basophils Relative: 1 %
Eosinophils Absolute: 0.3 10*3/uL (ref 0.0–0.5)
Eosinophils Relative: 2 %
HCT: 39.2 % (ref 39.0–52.0)
Hemoglobin: 12.2 g/dL — ABNORMAL LOW (ref 13.0–17.0)
Immature Granulocytes: 1 %
Lymphocytes Relative: 13 %
Lymphs Abs: 2 10*3/uL (ref 0.7–4.0)
MCH: 30.3 pg (ref 26.0–34.0)
MCHC: 31.1 g/dL (ref 30.0–36.0)
MCV: 97.3 fL (ref 80.0–100.0)
Monocytes Absolute: 1.8 10*3/uL — ABNORMAL HIGH (ref 0.1–1.0)
Monocytes Relative: 11 %
Neutro Abs: 11.2 10*3/uL — ABNORMAL HIGH (ref 1.7–7.7)
Neutrophils Relative %: 72 %
Platelets: 1228 10*3/uL (ref 150–400)
RBC: 4.03 MIL/uL — ABNORMAL LOW (ref 4.22–5.81)
RDW: 23.9 % — ABNORMAL HIGH (ref 11.5–15.5)
WBC: 15.6 10*3/uL — ABNORMAL HIGH (ref 4.0–10.5)
nRBC: 0 % (ref 0.0–0.2)

## 2020-08-02 LAB — COMPREHENSIVE METABOLIC PANEL
ALT: 17 U/L (ref 0–44)
AST: 31 U/L (ref 15–41)
Albumin: 3.5 g/dL (ref 3.5–5.0)
Alkaline Phosphatase: 82 U/L (ref 38–126)
Anion gap: 10 (ref 5–15)
BUN: 16 mg/dL (ref 8–23)
CO2: 32 mmol/L (ref 22–32)
Calcium: 9.1 mg/dL (ref 8.9–10.3)
Chloride: 95 mmol/L — ABNORMAL LOW (ref 98–111)
Creatinine, Ser: 0.79 mg/dL (ref 0.61–1.24)
GFR, Estimated: >60 mL/min (ref >60)
Glucose, Bld: 123 mg/dL — ABNORMAL HIGH (ref 70–99)
Potassium: 3.8 mmol/L (ref 3.5–5.1)
Sodium: 137 mmol/L (ref 135–145)
Total Bilirubin: 1.3 mg/dL — ABNORMAL HIGH (ref 0.3–1.2)
Total Protein: 7.1 g/dL (ref 6.5–8.1)

## 2020-08-02 LAB — C-REACTIVE PROTEIN: CRP: 1.4 mg/dL — ABNORMAL HIGH (ref <1.0)

## 2020-08-02 LAB — LACTATE DEHYDROGENASE: LDH: 542 U/L — ABNORMAL HIGH (ref 98–192)

## 2020-08-02 MED ORDER — HYDROXYUREA 500 MG PO CAPS: 500.0000 mg | ORAL_CAPSULE | Freq: Every day | ORAL | 1 refills | Status: DC

## 2020-08-02 NOTE — Progress Notes (Signed)
Clinton Gordon NOTE  Patient Care Team: Clinton Harrier, MD as PCP - General (Internal Medicine)  CHIEF COMPLAINTS/PURPOSE OF CONSULTATION: Thrombocytosis  HEMATOLOGY HISTORY   Oncology History Overview Note  # ESSENTIAL THROMBOCYTOSIS [platelets-600-700 since 2017 ; Hb-13; white count- 12]; 2019- CT-NEG for splenomegaly; June 2021-JAK-2 positive; Hydrea 500 mg BID; declines bone marrow biopsy;   # SEP 2021-admission Covid; acute thigh hematoma [on eliquis; a.fib]; Eliquis discontinued  #Lung lesion-?  Prior history of TB/question malignancy [Dr.Aleskerov]  # HTN; CAD- S/p CABG 2003; BPH   Essential thrombocythemia (Clinton Gordon)  01/10/2020 Initial Diagnosis   Essential thrombocythemia (Clinton Gordon)    HISTORY OF PRESENTING ILLNESS:  Clinton Gordon 86 y.o.  male essential thrombocytosis JAK-2 positive on Hydrea is here for follow-up.  Patient was taken off Hydrea after his admission to the hospital in September, 2021.  Patient states that he was feeling poorly while on [facility] he had a recent complicated admission for Covid/thigh hematoma etc.].  Given patient preference Hydrea has been held for the last 1 month.  Currently patient feels overall improved.  He is back to his baseline health.  Denies any tingling or numbness.  No nausea no vomiting.  Denies any chest pain.   Review of Systems  Constitutional: Positive for malaise/fatigue. Negative for chills, diaphoresis and fever.  HENT: Negative for nosebleeds and sore throat.   Eyes: Negative for double vision.  Respiratory: Negative for cough, hemoptysis and wheezing.   Cardiovascular: Negative for chest pain, palpitations, orthopnea and leg swelling.  Gastrointestinal: Negative for abdominal pain, blood in stool, constipation, diarrhea, heartburn, melena, nausea and vomiting.  Genitourinary: Negative for dysuria, frequency and urgency.  Musculoskeletal: Negative for back pain and joint pain.  Skin: Negative.   Negative for itching and rash.  Neurological: Negative for dizziness, tingling, focal weakness, weakness and headaches.  Endo/Heme/Allergies: Does not bruise/bleed easily.  Psychiatric/Behavioral: Negative for depression. The patient is not nervous/anxious and does not have insomnia.      MEDICAL HISTORY:  Past Medical History:  Diagnosis Date  . Hypertension   . Kidney stones     SURGICAL HISTORY: Past Surgical History:  Procedure Laterality Date  . CARDIAC SURGERY      SOCIAL HISTORY: Social History   Socioeconomic History  . Marital status: Married    Spouse name: Not on file  . Number of children: Not on file  . Years of education: Not on file  . Highest education level: Not on file  Occupational History  . Not on file  Tobacco Use  . Smoking status: Never Smoker  . Smokeless tobacco: Never Used  Vaping Use  . Vaping Use: Never used  Substance and Sexual Activity  . Alcohol use: No  . Drug use: Not on file  . Sexual activity: Not on file  Other Topics Concern  . Not on file  Social History Narrative   Retd. 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    Social Determinants of Health   Financial Resource Strain: Not on file  Food Insecurity: Not on file  Transportation Needs: Not on file  Physical Activity: Not on file  Stress: Not on file  Social Connections: Not on file  Intimate Partner Violence: Not on file    FAMILY HISTORY: No family history on file.  ALLERGIES:  has No Known Allergies.  MEDICATIONS:  Current Outpatient Medications  Medication Sig Dispense Refill  . albuterol (VENTOLIN HFA) 108 (90 Base)  MCG/ACT inhaler Inhale 1-2 puffs into the lungs every 6 (six) hours as needed for wheezing or shortness of breath. 6.7 g 3  . Digoxin 62.5 MCG TABS Take 0.0625 mg by mouth daily. 30 tablet 0  . Ipratropium-Albuterol (COMBIVENT) 20-100 MCG/ACT AERS respimat Inhale 1 puff into the lungs every 6 (six) hours.  4 g 0  . metoprolol tartrate (LOPRESSOR) 25 MG tablet Take 0.5 tablets (12.5 mg total) by mouth 2 (two) times daily. 30 tablet 0  . pantoprazole (PROTONIX) 40 MG tablet Take 1 tablet (40 mg total) by mouth 2 (two) times daily. 60 tablet 0  . tamsulosin (FLOMAX) 0.4 MG CAPS capsule Take 0.4 mg by mouth daily after supper.     . hydroxyurea (HYDREA) 500 MG capsule Take 1 capsule (500 mg total) by mouth daily. May take with food to minimize GI side effects. 60 capsule 1   No current facility-administered medications for this visit.     PHYSICAL EXAMINATION:   Vitals:   08/02/20 1500  BP: 131/86  Pulse: 82  Resp: 20  Temp: (!) 96.2 F (35.7 C)   Filed Weights   08/02/20 1500  Weight: 138 lb (62.6 kg)    Physical Exam Constitutional:      Comments: Accompanied by son.. Walking independently.  HENT:     Head: Normocephalic and atraumatic.     Mouth/Throat:     Pharynx: No oropharyngeal exudate.  Eyes:     Pupils: Pupils are equal, round, and reactive to light.  Cardiovascular:     Rate and Rhythm: Normal rate and regular rhythm.  Pulmonary:     Effort: Pulmonary effort is normal. No respiratory distress.     Breath sounds: Normal breath sounds. No wheezing.  Abdominal:     General: Bowel sounds are normal. There is no distension.     Palpations: Abdomen is soft. There is no mass.     Tenderness: There is no abdominal tenderness. There is no guarding or rebound.  Musculoskeletal:        General: No tenderness. Normal range of motion.     Cervical back: Normal range of motion and neck supple.  Skin:    General: Skin is warm.  Neurological:     Mental Status: He is alert and oriented to person, place, and time.  Psychiatric:        Mood and Affect: Affect normal.     LABORATORY DATA:  I have reviewed the data as listed Lab Results  Component Value Date   WBC 15.6 (H) 08/02/2020   HGB 12.2 (L) 08/02/2020   HCT 39.2 08/02/2020   MCV 97.3 08/02/2020   PLT 1,228 (HH)  08/02/2020   Recent Labs    04/25/20 1054 04/26/20 0614 04/27/20 0507 07/04/20 1308 08/02/20 1440  NA 134* 133* 133* 139 137  K 4.6 4.4 5.1 4.6 3.8  CL 100 98 97* 101 95*  CO2 26 27 29 27  32  GLUCOSE 142* 147* 148* 110* 123*  BUN 46* 37* 30* 10 16  CREATININE 0.93 0.89 0.97 0.83 0.79  CALCIUM 8.2* 8.1* 8.3* 9.3 9.1  GFRNONAA >60 >60 >60 >60 >60  GFRAA >60 >60 >60  --   --   PROT 5.2* 4.9* 5.0* 6.8 7.1  ALBUMIN 2.8* 2.7* 2.8* 3.6 3.5  AST 37 31 29 27 31   ALT 27 27 28 19 17   ALKPHOS 54 59 59 80 82  BILITOT 2.1* 2.1* 2.2* 1.1 1.3*     CT CHEST W  CONTRAST  Result Date: 07/17/2020 CLINICAL DATA:  Follow-up LEFT upper lobe densities, prior abnormal CT chest, shortness of breath and BILATERAL leg edema for 1 month, post COVID-19 pneumonia in August 2021; history hypertension, coronary artery disease post CABG EXAM: CT CHEST WITH CONTRAST TECHNIQUE: Multidetector CT imaging of the chest was performed during intravenous contrast administration. Sagittal and coronal MPR images reconstructed from axial data set. CONTRAST:  32m OMNIPAQUE IOHEXOL 300 MG/ML  SOLN IV COMPARISON:  04/17/2020 FINDINGS: Cardiovascular: Atherosclerotic calcifications aorta, coronary arteries, and proximal great vessels. Aneurysmal dilatation ascending thoracic aorta 4.0 cm transverse image 63. Enlargement of RIGHT atrium and RIGHT ventricle. Compression of LEFT atrium by a loculated fluid collection 7.5 x 5.8 cm likely loculated pericardial effusion. Additional loculated pericardial fluid collection posterosuperior to the LEFT ventricle 4.7 x 3.6 cm. Slightly prominent enhancement of the pericardium along the border adjacent to the LEFT ventricle, see image 99. Mediastinum/Nodes: Esophagus unremarkable. 6 mm RIGHT thyroid nodule; Not clinically significant; no follow-up imaging recommended. (ref: J Am Coll Radiol. 2015 Feb;12(2): 143-50).12 mm LEFT submandibular nodule likely lymph node image 1, incompletely assessed.  Mildly enlarged RIGHT hilar lymph node 11 mm short axis image 60. Calcified RIGHT hilar and subcarinal nodes. Additional scattered normal sized mediastinal lymph nodes. Stranding of anterior mediastinal fat without discrete mass/node. No axillary adenopathy. Lungs/Pleura: Calcification and scarring at RIGHT apex likely old granulomatous disease. Persistent spiculated posterior RIGHT upper lobe nodule 2.9 x 3.0 cm. Central peribronchial thickening. Small soft tissue nodules are seen at the major fissures bilaterally likely tumor. Abnormal pleural based soft tissue at RIGHT apex extending RIGHT paraspinal and extending to below the carina most likely representing pleural based tumor and pleural carcinomatosis. Partially loculated RIGHT pleural effusion. On the LEFT, small LEFT pleural effusion is identified inferiorly with a larger fluid collection loculated at the medial LEFT apex 8.5 x 6.1 cm, with significant compression of the LEFT upper lobe. Minimal pleural thickening dependently posterior LEFT hemithorax inferiorly cannot exclude LEFT pleural carcinomatosis. Near complete resolution of airspace infiltrates identified in both lungs on previous exam which or most suggestive of COVID pneumonia. Scattered interseptal thickening especially at lower lungs. Extensive peribronchial thickening RIGHT middle and BILATERAL lower lobes. Scattered fissural thickening bilaterally. Upper Abdomen: Abnormal soft tissue mass 4.5 x 4.5 cm invading the splenic hilum and extending to abut the pancreatic tail. Additional enhancing splenic nodule 10 x 9 mm question metastasis. LEFT adrenal nodule 1.6 x 1.3 cm not significantly changed. Cholelithiasis including calculi at the gallbladder neck. Minimally enlarged retrocrural node 11 mm. Thickening of RIGHT adrenal gland without discrete mass. Small cysts at upper pole of RIGHT kidney. Thickened gastric wall though this may be an artifact related to underdistention. Musculoskeletal: Few  scattered dense sclerotic foci in thoracic spine and LEFT sixth rib nonspecific, may represent small bone islands or sclerotic metastases, unchanged. No destructive bone lesions. Prior median sternotomy. IMPRESSION: Again identified spiculated mass RIGHT upper lobe 2.9 x 3.0 cm suspicious for malignancy. 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 Probable LEFT pleural carcinomatosis as well with a few areas of nodularity at the LEFT pleura and less extensive pleural thickening. Loculated pleural effusion at LEFT apex with partial compressive atelectasis of LEFT upper lobe. Previously identified LEFT upper lobe nodule obscured secondary to LEFT upper lobe atelectasis. Loculated pericardial effusion including a 7.5 x 5.8 cm collection compressing the LEFT atrium and an additional loculated 4.7 x 3.6 cm collection superoposterior to  LEFT ventricle. New soft tissue mass invading splenic hilum and abutting spur pancreatic tail. LEFT adrenal metastasis. Cholelithiasis. Improved infiltrates of COVID-19 though progressive peribronchial thickening and interseptal thickening are are identified in RIGHT middle lobe and BILATERAL lower lobes. Aneurysmal dilatation ascending thoracic aorta 4.0 cm transverse, recommendation below. Recommend annual imaging followup by CTA or MRA, if clinically warranted based on patient comorbidities and age. This recommendation follows 2010 ACCF/AHA/AATS/ACR/ASA/SCA/SCAI/SIR/STS/SVM Guidelines for the Diagnosis and Management of Patients with Thoracic Aortic Disease. Circulation. 2010; 121: S287-G811. Aortic aneurysm NOS (ICD10-I71.9) Aortic Atherosclerosis (ICD10-I70.0) and Aortic aneurysm NOS (ICD10-I71.9). These results will be called to the ordering clinician or representative by the Radiologist Assistant, and communication documented in the PACS or [employer]. Electronically Signed   By: Lavonia Dana  M.D.   On: 07/17/2020 09:33    ASSESSMENT & PLAN:   Essential thrombocythemia ([place]) # Essential thrombocytosis-JAK-2 positive--platelets 600-700; range since 2017; fairly normal hemoglobin; white count.  High risk for thrombosis-given age/JAK2 mutation; STOPPED hydrea [sec to anemia/fatigue/ nail discoloration ].   #Today platelet count is 1228; hemoglobin is 12; white count is 15.  Recommend restarting Hydrea 500 mg once a day.  Patient reluctantly agrees.  Discussed the concerns of stroke if untreated.  Discussed that we have the option of using alternative medication like anagrelide if patient continues to have intolerance to Hydrea.  #Anemia-blood loss/hematoma [while on eliquis/A.fib; currently stopped]-hemoglobin is 12.3.     #Leukocytosis with predominant neutrophilia-again likely reactive; await peripheral blood flow cytometry.  # DISPOSITION:  # labs- cbc/bmp; LDH-consider increasing dose.  # follow up in 2 months MD;- cbc/cmp/LDH; Dr.B  Cc; Dr.Hande   Cammie Sickle, MD 08/02/2020 4:23 PM

## 2020-08-02 NOTE — Assessment & Plan Note (Addendum)
#   Essential thrombocytosis-JAK-2 positive--platelets 600-700; range since 2017; fairly normal hemoglobin; white count.  High risk for thrombosis-given age/JAK2 mutation; STOPPED hydrea [sec to anemia/fatigue/ nail discoloration ].   #Today platelet count is 1228; hemoglobin is 12; white count is 15.  Recommend restarting Hydrea 500 mg once a day.  Patient reluctantly agrees.  Discussed the concerns of stroke if untreated.  Discussed that we have the option of using alternative medication like anagrelide if patient continues to have intolerance to Hydrea.  #Anemia-blood loss/hematoma [while on eliquis/A.fib; currently stopped]-hemoglobin is 12.3.     #Leukocytosis with predominant neutrophilia-again likely reactive; await peripheral blood flow cytometry.  # DISPOSITION:  # labs- cbc/bmp; LDH-consider increasing dose.  # follow up in 2 months MD;- cbc/cmp/LDH; Dr.B  Cc; Dr.Hande

## 2020-08-02 NOTE — Progress Notes (Signed)
critical plt count 1228 called by [facility] Lawman in [facility] lab- 1311. Dr. Rogue Bussing made aware. Read back process performed

## 2020-08-04 LAB — COMP PANEL: LEUKEMIA/LYMPHOMA

## 2020-08-21 ENCOUNTER — Telehealth: Payer: Self-pay | Admitting: *Deleted

## 2020-08-21 NOTE — Telephone Encounter (Signed)
Spoke with daughter in [employer]. Moved lab apt on Wed 1/12 to Thursday 1/13 at 8:30 am. Added patient to see Dr. Jacinto Reap at [facility]. She states that pt stopped taking the hydrea last evening and he was already feeling better. She stated that Dr. B already dose reduced. She feels that it is best to hold the hydrea at this time.

## 2020-08-21 NOTE — Telephone Encounter (Signed)
Daughter in law called reporting that the Hydrea patient was started on is not working for patient. She reports that patient has no appetite and is depressed. She would like to discuss this with doctor.

## 2020-08-21 NOTE — Telephone Encounter (Signed)
Hold hydrea and see Dr. B next week.

## 2020-08-21 NOTE — Telephone Encounter (Signed)
I will call daughter and provide an apt for next week with labs.

## 2020-08-30 ENCOUNTER — Other Ambulatory Visit: Payer: Medicare Other

## 2020-08-31 ENCOUNTER — Other Ambulatory Visit: Payer: Self-pay

## 2020-08-31 ENCOUNTER — Inpatient Hospital Stay (HOSPITAL_BASED_OUTPATIENT_CLINIC_OR_DEPARTMENT_OTHER): Payer: Medicare Other | Admitting: Internal Medicine

## 2020-08-31 ENCOUNTER — Inpatient Hospital Stay: Payer: Medicare Other | Attending: Internal Medicine

## 2020-08-31 DIAGNOSIS — Z87442 Personal history of urinary calculi: Secondary | ICD-10-CM | POA: Diagnosis not present

## 2020-08-31 DIAGNOSIS — I4891 Unspecified atrial fibrillation: Secondary | ICD-10-CM | POA: Diagnosis not present

## 2020-08-31 DIAGNOSIS — I10 Essential (primary) hypertension: Secondary | ICD-10-CM | POA: Insufficient documentation

## 2020-08-31 DIAGNOSIS — I2510 Atherosclerotic heart disease of native coronary artery without angina pectoris: Secondary | ICD-10-CM | POA: Insufficient documentation

## 2020-08-31 DIAGNOSIS — D72829 Elevated white blood cell count, unspecified: Secondary | ICD-10-CM | POA: Insufficient documentation

## 2020-08-31 DIAGNOSIS — Z8616 Personal history of COVID-19: Secondary | ICD-10-CM | POA: Insufficient documentation

## 2020-08-31 DIAGNOSIS — R918 Other nonspecific abnormal finding of lung field: Secondary | ICD-10-CM | POA: Diagnosis not present

## 2020-08-31 DIAGNOSIS — R634 Abnormal weight loss: Secondary | ICD-10-CM | POA: Diagnosis not present

## 2020-08-31 DIAGNOSIS — R630 Anorexia: Secondary | ICD-10-CM | POA: Insufficient documentation

## 2020-08-31 DIAGNOSIS — Z79899 Other long term (current) drug therapy: Secondary | ICD-10-CM | POA: Insufficient documentation

## 2020-08-31 DIAGNOSIS — D473 Essential (hemorrhagic) thrombocythemia: Secondary | ICD-10-CM

## 2020-08-31 DIAGNOSIS — Z8611 Personal history of tuberculosis: Secondary | ICD-10-CM | POA: Insufficient documentation

## 2020-08-31 DIAGNOSIS — N400 Enlarged prostate without lower urinary tract symptoms: Secondary | ICD-10-CM | POA: Insufficient documentation

## 2020-08-31 DIAGNOSIS — D649 Anemia, unspecified: Secondary | ICD-10-CM | POA: Insufficient documentation

## 2020-08-31 DIAGNOSIS — D72820 Lymphocytosis (symptomatic): Secondary | ICD-10-CM | POA: Diagnosis not present

## 2020-08-31 LAB — CBC WITH DIFFERENTIAL/PLATELET
Abs Immature Granulocytes: 0.31 10*3/uL — ABNORMAL HIGH (ref 0.00–0.07)
Basophils Absolute: 0.3 10*3/uL — ABNORMAL HIGH (ref 0.0–0.1)
Basophils Relative: 1 %
Eosinophils Absolute: 0.3 10*3/uL (ref 0.0–0.5)
Eosinophils Relative: 2 %
HCT: 38.3 % — ABNORMAL LOW (ref 39.0–52.0)
Hemoglobin: 11.7 g/dL — ABNORMAL LOW (ref 13.0–17.0)
Immature Granulocytes: 2 %
Lymphocytes Relative: 9 %
Lymphs Abs: 1.7 10*3/uL (ref 0.7–4.0)
MCH: 27.8 pg (ref 26.0–34.0)
MCHC: 30.5 g/dL (ref 30.0–36.0)
MCV: 91 fL (ref 80.0–100.0)
Monocytes Absolute: 2.3 10*3/uL — ABNORMAL HIGH (ref 0.1–1.0)
Monocytes Relative: 12 %
Neutro Abs: 14 10*3/uL — ABNORMAL HIGH (ref 1.7–7.7)
Neutrophils Relative %: 74 %
Platelets: 1838 10*3/uL (ref 150–400)
RBC: 4.21 MIL/uL — ABNORMAL LOW (ref 4.22–5.81)
RDW: 23.8 % — ABNORMAL HIGH (ref 11.5–15.5)
Smear Review: INCREASED
WBC: 18.9 10*3/uL — ABNORMAL HIGH (ref 4.0–10.5)
nRBC: 0 % (ref 0.0–0.2)

## 2020-08-31 LAB — LACTATE DEHYDROGENASE: LDH: 599 U/L — ABNORMAL HIGH (ref 98–192)

## 2020-08-31 LAB — BASIC METABOLIC PANEL
Anion gap: 9 (ref 5–15)
BUN: 14 mg/dL (ref 8–23)
CO2: 32 mmol/L (ref 22–32)
Calcium: 9.1 mg/dL (ref 8.9–10.3)
Chloride: 96 mmol/L — ABNORMAL LOW (ref 98–111)
Creatinine, Ser: 0.83 mg/dL (ref 0.61–1.24)
GFR, Estimated: >60 mL/min (ref >60)
Glucose, Bld: 147 mg/dL — ABNORMAL HIGH (ref 70–99)
Potassium: 4.2 mmol/L (ref 3.5–5.1)
Sodium: 137 mmol/L (ref 135–145)

## 2020-08-31 MED ORDER — ANAGRELIDE HCL 1 MG PO CAPS: 1.0000 mg | ORAL_CAPSULE | Freq: Two times a day (BID) | ORAL | 3 refills | Status: AC

## 2020-08-31 NOTE — Patient Instructions (Signed)
#   STOP ASPRIN for now. Will give recommendations at next visit.

## 2020-08-31 NOTE — Progress Notes (Signed)
[place] NOTE  Patient Care Team: Tracie Harrier, MD as PCP - General (Internal Medicine)  CHIEF COMPLAINTS/PURPOSE OF CONSULTATION: Essential thrombocytosis  HEMATOLOGY HISTORY   Oncology History Overview Note  # ESSENTIAL THROMBOCYTOSIS [platelets-600-700 since 2017 ; Hb-13; white count- 12]; 2019- CT-NEG for splenomegaly; June 2021-JAK-2 positive; Hydrea 500 mg BID; declines bone marrow biopsy;STOPPED early JAN 2022- sec to INTOLERANCE [poor apetiite]   # JAN MID 2022- START ANAGRELIDE 54m BID.   # SEP 2021-admission Covid; acute thigh hematoma [on eliquis; a.fib]; Eliquis discontinued  #Lung lesion-?  Prior history of TB/question malignancy [Dr.Aleskerov]  # HTN; CAD- S/p CABG 2003; BPH   [place]  01/10/2020 Initial Diagnosis   Essential [place]ocythemia ([place]    HISTORY OF PRESENTING ILLNESS:  BAllie Dimmer89y.o.  male essential thrombocytosis JAK-2 positive on Hydrea is here for follow-up.  The patient stopped Hydrea approximately 10 days ago early January 2022 because of poor tolerance.  He attributes poor appetite fatigue secondary to Hydrea.  Denies any tingling or numbness.  Denies any nausea or vomiting.  Denies any chest pain or shortness of the cough.  No burning pain in the extremities.  No strokes.  Review of Systems  Constitutional: Positive for malaise/fatigue. Negative for chills, diaphoresis and fever.  HENT: Negative for nosebleeds and sore throat.   Eyes: Negative for double vision.  Respiratory: Negative for cough, hemoptysis and wheezing.   Cardiovascular: Negative for chest pain, palpitations, orthopnea and leg swelling.  Gastrointestinal: Negative for abdominal pain, blood in stool, constipation, diarrhea, heartburn, melena, nausea and vomiting.  Genitourinary: Negative for dysuria, frequency and urgency.  Musculoskeletal: Negative for back pain and joint pain.  Skin: Negative.  Negative for  itching and rash.  Neurological: Negative for dizziness, tingling, focal weakness, weakness and headaches.  Endo/Heme/Allergies: Does not bruise/bleed easily.  Psychiatric/Behavioral: Negative for depression. The patient is not nervous/anxious and does not have insomnia.      MEDICAL HISTORY:  Past Medical History:  Diagnosis Date  . Hypertension   . Kidney stones     SURGICAL HISTORY: Past Surgical History:  Procedure Laterality Date  . CARDIAC SURGERY      SOCIAL HISTORY: Social History   Socioeconomic History  . Marital status: Married    Spouse name: Not on file  . Number of children: Not on file  . Years of education: Not on file  . Highest education level: Not on file  Occupational History  . Not on file  Tobacco Use  . Smoking status: Never Smoker  . Smokeless tobacco: Never Used  Vaping Use  . Vaping Use: Never used  Substance and Sexual Activity  . Alcohol use: No  . Drug use: Not on file  . Sexual activity: Not on file  Other Topics Concern  . 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    Social Determinants of Health   Financial Resource Strain: Not on file  Food Insecurity: Not on file  Transportation Needs: Not on file  Physical Activity: Not on file  Stress: Not on file  Social Connections: Not on file  Intimate Partner Violence: Not on file    FAMILY HISTORY: No family history on file.  ALLERGIES:  has No Known Allergies.  MEDICATIONS:  Current Outpatient Medications  Medication Sig Dispense Refill  . albuterol (VENTOLIN HFA) 108 (90 Base) MCG/ACT inhaler Inhale 1-2 puffs  into the lungs every 6 (six) hours as needed for wheezing or shortness of breath. 6.7 g 3  . anagrelide (AGRYLIN) 1 MG capsule Take 1 capsule (1 mg total) by mouth 2 (two) times daily. 60 capsule 3  . Ipratropium-Albuterol (COMBIVENT) 20-100 MCG/ACT AERS respimat Inhale 1 puff into the  lungs every 6 (six) hours. 4 g 0  . pantoprazole (PROTONIX) 40 MG tablet Take 1 tablet (40 mg total) by mouth 2 (two) times daily. 60 tablet 0  . tamsulosin (FLOMAX) 0.4 MG CAPS capsule Take 0.4 mg by mouth daily after supper.     . Digoxin 62.5 MCG TABS Take 0.0625 mg by mouth daily. 30 tablet 0  . hydroxyurea (HYDREA) 500 MG capsule Take 1 capsule (500 mg total) by mouth daily. May take with food to minimize GI side effects. (Patient not taking: Reported on 08/31/2020) 60 capsule 1  . metoprolol tartrate (LOPRESSOR) 25 MG tablet Take 0.5 tablets (12.5 mg total) by mouth 2 (two) times daily. 30 tablet 0   No current facility-administered medications for this visit.     PHYSICAL EXAMINATION:   Vitals:   08/31/20 0912  BP: 120/75  Pulse: 74  Resp: 18  Temp: 98.6 F (37 C)  SpO2: 97%   Filed Weights   08/31/20 0912  Weight: 133 lb (60.3 kg)    Physical Exam Constitutional:      Comments: Accompanied by son.. Walking independently.  HENT:     Head: Normocephalic and atraumatic.     Mouth/Throat:     Pharynx: No oropharyngeal exudate.  Eyes:     Pupils: Pupils are equal, round, and reactive to light.  Cardiovascular:     Rate and Rhythm: Normal rate and regular rhythm.  Pulmonary:     Effort: Pulmonary effort is normal. No respiratory distress.     Breath sounds: Normal breath sounds. No wheezing.  Abdominal:     General: Bowel sounds are normal. There is no distension.     Palpations: Abdomen is soft. There is no mass.     Tenderness: There is no abdominal tenderness. There is no guarding or rebound.  Musculoskeletal:        General: No tenderness. Normal range of motion.     Cervical back: Normal range of motion and neck supple.  Skin:    General: Skin is warm.  Neurological:     Mental Status: He is alert and oriented to person, place, and time.  Psychiatric:        Mood and Affect: Affect normal.     LABORATORY DATA:  I have reviewed the data as listed Lab  Results  Component Value Date   WBC 18.9 (H) 08/31/2020   HGB 11.7 (L) 08/31/2020   HCT 38.3 (L) 08/31/2020   MCV 91.0 08/31/2020   PLT 1,838 (HH) 08/31/2020   Recent Labs    04/25/20 1054 04/26/20 0614 04/27/20 0507 07/04/20 1308 08/02/20 1440 08/31/20 0846  NA 134* 133* 133* 139 137 137  K 4.6 4.4 5.1 4.6 3.8 4.2  CL 100 98 97* 101 95* 96*  CO2 _0 32 32  GLUCOSE 142* 147* 148* 110* 123* 147*  BUN 46* 37* 30* _1 CREATININE 0.93 0.89 0.97 0.83 0.79 0.83  CALCIUM 8.2* 8.1* 8.3* 9.3 9.1 9.1  GFRNONAA >60 >60 >60 >60 >60 >60  GFRAA >60 >60 >60  --   --   --   PROT 5.2* 4.9* 5.0* 6.8 7.1  --  ALBUMIN 2.8* 2.7* 2.8* 3.6 3.5  --   AST 37 _0 --   ALT _1 --   ALKPHOS 54 59 59 80 82  --   BILITOT 2.1* 2.1* 2.2* 1.1 1.3*  --      No results found.  ASSESSMENT & PLAN:   Essential thrombocythemia ([place]) # Essential thrombocytosis-JAK-2 positive--platelets 600-700.  Patient in August 19, 2020 stopped taking Hydrea [declines Hydrea]-second intolerance [fatigue poor appetite].   #Today white count is 18; hemoglobin is 11.7 platelets 1.8 million.  I discussed the possibility of primary myelofibrosis given the patient's elevated white count/mild anemia.  Discussed the role of bone marrow biopsy-as the myelofibrosis as different prognosis/and treatment option includes Jakafi.  However patient/son declined any further work-up as they are not too inclined on exploring other options including Jakafi.   #Recommend starting anagrelide.  Discussed the potential side effects including but not limited to diarrhea feet swelling etc.  Recommend 1 mg twice daily we will repeat labs in 1 month.  Given the extreme thrombocytosis recommend holding aspirin for now  #Leukocytosis with predominant neutrophilia-again likely reactive; no blasts noted on peripheral blood flow cytometry.  Declined bone marrow as above.  # DISPOSITION:  # follow up in 1 months MD;-  cbc/cmp/LDH; Dr.B  Cc; Dr.Hande   Cammie Sickle, MD 08/31/2020 10:00 AM

## 2020-08-31 NOTE — Assessment & Plan Note (Addendum)
#  Essential thrombocytosis-JAK-2 positive--platelets 600-700.  Patient in August 19, 2020 stopped taking Hydrea [declines Hydrea]-second intolerance [fatigue poor appetite].   #Today white count is 18; hemoglobin is 11.7 platelets 1.8 million.  I discussed the possibility of primary myelofibrosis given the patient's elevated white count/mild anemia.  Discussed the role of bone marrow biopsy-as the myelofibrosis as different prognosis/and treatment option includes Jakafi.  However patient/son declined any further work-up as they are not too inclined on exploring other options including Jakafi.   #Recommend starting anagrelide.  Discussed the potential side effects including but not limited to diarrhea feet swelling etc.  Recommend 1 mg twice daily we will repeat labs in 1 month.  Given the extreme thrombocytosis recommend holding aspirin for now  #Leukocytosis with predominant neutrophilia-again likely reactive; no blasts noted on peripheral blood flow cytometry.  Declined bone marrow as above.  # DISPOSITION:  # follow up in 1 months MD;- cbc/cmp/LDH; Dr.B  Cc; Dr.Hande

## 2020-09-18 ENCOUNTER — Inpatient Hospital Stay: Payer: Medicare Other

## 2020-09-18 ENCOUNTER — Inpatient Hospital Stay (HOSPITAL_BASED_OUTPATIENT_CLINIC_OR_DEPARTMENT_OTHER): Payer: Medicare Other | Admitting: Internal Medicine

## 2020-09-18 ENCOUNTER — Other Ambulatory Visit: Payer: Self-pay

## 2020-09-18 DIAGNOSIS — D473 Essential (hemorrhagic) thrombocythemia: Secondary | ICD-10-CM

## 2020-09-18 LAB — CBC WITH DIFFERENTIAL/PLATELET
Abs Immature Granulocytes: 0.31 10*3/uL — ABNORMAL HIGH (ref 0.00–0.07)
Basophils Absolute: 0.2 10*3/uL — ABNORMAL HIGH (ref 0.0–0.1)
Basophils Relative: 1 %
Eosinophils Absolute: 0.4 10*3/uL (ref 0.0–0.5)
Eosinophils Relative: 1 %
HCT: 36.8 % — ABNORMAL LOW (ref 39.0–52.0)
Hemoglobin: 11.2 g/dL — ABNORMAL LOW (ref 13.0–17.0)
Immature Granulocytes: 1 %
Lymphocytes Relative: 9 %
Lymphs Abs: 2.1 10*3/uL (ref 0.7–4.0)
MCH: 26.4 pg (ref 26.0–34.0)
MCHC: 30.4 g/dL (ref 30.0–36.0)
MCV: 86.8 fL (ref 80.0–100.0)
Monocytes Absolute: 2.6 10*3/uL — ABNORMAL HIGH (ref 0.1–1.0)
Monocytes Relative: 11 %
Neutro Abs: 18.7 10*3/uL — ABNORMAL HIGH (ref 1.7–7.7)
Neutrophils Relative %: 77 %
Platelets: 1159 10*3/uL (ref 150–400)
RBC: 4.24 MIL/uL (ref 4.22–5.81)
RDW: 23.8 % — ABNORMAL HIGH (ref 11.5–15.5)
WBC: 24.3 10*3/uL — ABNORMAL HIGH (ref 4.0–10.5)
nRBC: 0.1 % (ref 0.0–0.2)

## 2020-09-18 LAB — LACTATE DEHYDROGENASE: LDH: 636 U/L — ABNORMAL HIGH (ref 98–192)

## 2020-09-18 LAB — COMPREHENSIVE METABOLIC PANEL
ALT: 22 U/L (ref 0–44)
AST: 42 U/L — ABNORMAL HIGH (ref 15–41)
Albumin: 3.2 g/dL — ABNORMAL LOW (ref 3.5–5.0)
Alkaline Phosphatase: 150 U/L — ABNORMAL HIGH (ref 38–126)
Anion gap: 10 (ref 5–15)
BUN: 18 mg/dL (ref 8–23)
CO2: 29 mmol/L (ref 22–32)
Calcium: 8.6 mg/dL — ABNORMAL LOW (ref 8.9–10.3)
Chloride: 95 mmol/L — ABNORMAL LOW (ref 98–111)
Creatinine, Ser: 0.77 mg/dL (ref 0.61–1.24)
GFR, Estimated: >60 mL/min (ref >60)
Glucose, Bld: 158 mg/dL — ABNORMAL HIGH (ref 70–99)
Potassium: 4.2 mmol/L (ref 3.5–5.1)
Sodium: 134 mmol/L — ABNORMAL LOW (ref 135–145)
Total Bilirubin: 0.9 mg/dL (ref 0.3–1.2)
Total Protein: 6.8 g/dL (ref 6.5–8.1)

## 2020-09-18 NOTE — Assessment & Plan Note (Addendum)
#  Essential thrombocytosis-JAK-2 positive [? PMF-see below] Patient most recently on anagrelide $RemoveBefor'1mg'dzXryMOLsXcS$  BID since mid January 2022; however patient stopped after taking for 2 weeks or so [due to intolerance-see below]. Today platelet count is 1.1 million; hemoglobin 11 white count 15.  #Again discussed the importance of a bone marrow biopsy for diagnosing-ET versus myelofibrosis-as treatment options would change/also as prognosis would be different.  Patient declines bone marrow biopsy.  At this time he also declines anagrelide.  #Fatigue lack of appetite/poor taste-clinically not convinced the patient's symptoms are from anagrelide; in fact I suspect symptoms are from underlying MPN-strongly suspect myelofibrosis.  Given patient preference will hold off anagrelide; to reassess symptomatology.Patient understands risk of stroke; for acute vascular event with elevated platelets.  I spoke at length with the patient's son regarding the patient's clinical status/plan of care.  Family agreement.    # DISPOSITION: late afternoon appt # follow up in 2 weeks- MD;- cbc/cmp/LDH; Dr.B  Cc; Dr.Hande

## 2020-09-18 NOTE — Progress Notes (Signed)
[facility] NOTE  Patient Care Team: Tracie Harrier, MD as PCP - General (Internal Medicine)  CHIEF COMPLAINTS/PURPOSE OF CONSULTATION: Essential thrombocytosis  HEMATOLOGY HISTORY   Oncology History Overview Note  # ESSENTIAL THROMBOCYTOSIS [platelets-600-700 since 2017 ; Hb-13; white count- 12]; 2019- CT-NEG for splenomegaly; June 2021-JAK-2 positive; Hydrea 500 mg BID; declines bone marrow biopsy [? PMF];STOPPED early JAN 2022- sec to INTOLERANCE [poor apetiite]   # JAN MID 2022- START ANAGRELIDE 43m BID; STOPPED after 2 weeks [sec to intolerance].   # SEP 2021-admission Covid; acute thigh hematoma [on eliquis; a.fib]; Eliquis discontinued  #Lung lesion-?  Prior history of TB/question malignancy [Dr.Aleskerov]  # HTN; CAD- S/p CABG 2003; BPH   Essential [place]ocythemia ([facility]  01/10/2020 Initial Diagnosis   [place]    HISTORY OF PRESENTING ILLNESS:  BMIHRAN LEBARRON89y.o.  male essential thrombocytosis JAK-2 positive on anagrelide is here for follow-up.  Patient was started on anagrelide approximately 4 weeks ago.  However stopped 4 days ago because patient could not sleep, poor appetite.  Poor taste.  Worsening fatigue. Patient is convinced that his symptoms-secondary to anagrelide.   Patient is also on Lasix 40 mg a day [PCP/Dr.Fath]. Denies any tingling or numbness.  Denies any nausea or vomiting.  Denies any chest pain or shortness of the cough.  No burning pain in the extremities.  No strokes.   Review of Systems  Constitutional: Positive for malaise/fatigue. Negative for chills, diaphoresis and fever.  HENT: Negative for nosebleeds and sore throat.   Eyes: Negative for double vision.  Respiratory: Negative for cough, hemoptysis and wheezing.   Cardiovascular: Negative for chest pain, palpitations, orthopnea and leg swelling.  Gastrointestinal: Negative for abdominal pain, blood in stool, constipation, diarrhea,  heartburn, melena, nausea and vomiting.  Genitourinary: Negative for dysuria, frequency and urgency.  Musculoskeletal: Negative for back pain and joint pain.  Skin: Negative.  Negative for itching and rash.  Neurological: Negative for dizziness, tingling, focal weakness, weakness and headaches.  Endo/Heme/Allergies: Does not bruise/bleed easily.  Psychiatric/Behavioral: Negative for depression. The patient has insomnia. The patient is not nervous/anxious.      MEDICAL HISTORY:  Past Medical History:  Diagnosis Date  . Hypertension   . Kidney stones     SURGICAL HISTORY: Past Surgical History:  Procedure Laterality Date  . CARDIAC SURGERY      SOCIAL HISTORY: Social History   Socioeconomic History  . Marital status: Married    Spouse name: Not on file  . Number of children: Not on file  . Years of education: Not on file  . Highest education level: Not on file  Occupational History  . Not on file  Tobacco Use  . Smoking status: Never Smoker  . Smokeless tobacco: Never Used  Vaping Use  . Vaping Use: Never used  Substance and Sexual Activity  . Alcohol use: No  . Drug use: Not on file  . Sexual activity: Not on file  Other Topics Concern  . 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    Social Determinants of Health   Financial Resource Strain: Not on file  Food Insecurity: Not on file  Transportation Needs: Not on file  Physical Activity: Not on file  Stress: Not on file  Social Connections: Not on file  Intimate Partner Violence: Not on file    FAMILY HISTORY: No family history on  file.  ALLERGIES:  has No Known Allergies.  MEDICATIONS:  Current Outpatient Medications  Medication Sig Dispense Refill  . albuterol (VENTOLIN HFA) 108 (90 Base) MCG/ACT inhaler Inhale 1-2 puffs into the lungs every 6 (six) hours as needed for wheezing or shortness of breath. 6.7 g 3  .  anagrelide (AGRYLIN) 1 MG capsule Take 1 capsule (1 mg total) by mouth 2 (two) times daily. 60 capsule 3  . ascorbic acid (VITAMIN C) 500 MG tablet Take 1 tablet by mouth daily.    . digoxin (LANOXIN) 0.125 MG tablet Take 125 mcg by mouth daily.    . ergocalciferol (VITAMIN D2) 1.25 MG (50000 UT) capsule Take 1 capsule by mouth once a week.    . furosemide (LASIX) 40 MG tablet Take 1 tablet by mouth daily.    . Ipratropium-Albuterol (COMBIVENT) 20-100 MCG/ACT AERS respimat Inhale 1 puff into the lungs every 6 (six) hours. 4 g 0  . iron polysaccharides (NIFEREX) 150 MG capsule Take 1 capsule by mouth daily.    . metoprolol tartrate (LOPRESSOR) 25 MG tablet Take 0.5 tablets (12.5 mg total) by mouth 2 (two) times daily. 30 tablet 0  . pantoprazole (PROTONIX) 40 MG tablet Take 1 tablet (40 mg total) by mouth 2 (two) times daily. 60 tablet 0  . tamsulosin (FLOMAX) 0.4 MG CAPS capsule Take 0.4 mg by mouth daily after supper.      No current facility-administered medications for this visit.     PHYSICAL EXAMINATION:   Vitals:   09/18/20 1530  BP: 118/71  Pulse: 82  Resp: 20  Temp: 97.9 F (36.6 C)   Filed Weights   09/18/20 1530  Weight: 133 lb 6.4 oz (60.5 kg)    Physical Exam Constitutional:      Comments: Accompanied by son.. Walking independently.  HENT:     Head: Normocephalic and atraumatic.     Mouth/Throat:     Pharynx: No oropharyngeal exudate.  Eyes:     Pupils: Pupils are equal, round, and reactive to light.  Cardiovascular:     Rate and Rhythm: Normal rate and regular rhythm.  Pulmonary:     Effort: Pulmonary effort is normal. No respiratory distress.     Breath sounds: Normal breath sounds. No wheezing.  Abdominal:     General: Bowel sounds are normal. There is no distension.     Palpations: Abdomen is soft. There is no mass.     Tenderness: There is no abdominal tenderness. There is no guarding or rebound.  Musculoskeletal:        General: No tenderness. Normal  range of motion.     Cervical back: Normal range of motion and neck supple.  Skin:    General: Skin is warm.  Neurological:     Mental Status: He is alert and oriented to person, place, and time.  Psychiatric:        Mood and Affect: Affect normal.     LABORATORY DATA:  I have reviewed the data as listed Lab Results  Component Value Date   WBC 24.3 ([facility]) 09/18/2020   HGB 11.2 (L) 09/18/2020   HCT 36.8 (L) 09/18/2020   MCV 86.8 09/18/2020   PLT 1,159 ([facility]) 09/18/2020   Recent Labs    04/25/20 1054 04/26/20 0614 04/27/20 0507 04/27/20 0507 07/04/20 1308 08/02/20 1440 08/31/20 0846 09/18/20 1509  NA 134* 133* 133*  --  139 137 137 134*  K 4.6 4.4 5.1  --  4.6 3.8 4.2 4.2  CL  100 98 97*  --  101 95* 96* 95*  CO2 _0 --  27 32 32 29  GLUCOSE 142* 147* 148*  --  110* 123* 147* 158*  BUN 46* 37* 30*  --  _1 CREATININE 0.93 0.89 0.97  --  0.83 0.79 0.83 0.77  CALCIUM 8.2* 8.1* 8.3*  --  9.3 9.1 9.1 8.6*  GFRNONAA >60 >60 >60   < > >60 >60 >60 >60  GFRAA >60 >60 >60  --   --   --   --   --   PROT 5.2* 4.9* 5.0*  --  6.8 7.1  --  6.8  ALBUMIN 2.8* 2.7* 2.8*  --  3.6 3.5  --  3.2*  AST 37 31 29  --  27 31  --  42*  ALT _2 --  19 17  --  22  ALKPHOS 54 59 59  --  80 82  --  150*  BILITOT 2.1* 2.1* 2.2*  --  1.1 1.3*  --  0.9   < > = values in this interval not displayed.     No results found.  ASSESSMENT & PLAN:   Essential thrombocythemia [facility]) # Essential thrombocytosis-JAK-2 positive [? PMF-see below] Patient most recently on anagrelide 71m BID since mid January 2022; however patient stopped after taking for 2 weeks or so [due to intolerance-see below]. Today platelet count is 1.1 million; hemoglobin 11 white count 15.  #Again discussed the importance of a bone marrow biopsy for diagnosing-ET versus myelofibrosis-as treatment options would change/also as prognosis would be different.  Patient declines bone marrow biopsy.  At this time he also  declines anagrelide.  #Fatigue lack of appetite/poor taste-clinically not convinced the patient's symptoms are from anagrelide; in fact I suspect symptoms are from underlying MPN-strongly suspect myelofibrosis.  Given patient preference will hold off anagrelide; to reassess symptomatology.Patient understands risk of stroke; for acute vascular event with elevated platelets.  I spoke at length with the patient's son regarding the patient's clinical status/plan of care.  Family agreement.    # DISPOSITION: late afternoon appt # follow up in 2 weeks- MD;- cbc/cmp/LDH; Dr.B  Cc; Dr.Hande   GCammie Sickle MD 09/22/2020 8:39 AM

## 2020-09-18 NOTE — Progress Notes (Signed)
Critical lab reported by Collene Mares at 1547 to Dr. Rogue Bussing and Nira Conn, [place]. Read back process performed with Lab tech/md.  Critical plt count at 1159

## 2020-09-23 ENCOUNTER — Other Ambulatory Visit: Payer: Self-pay | Admitting: Internal Medicine

## 2020-10-02 ENCOUNTER — Ambulatory Visit: Payer: Medicare Other | Admitting: Internal Medicine

## 2020-10-02 ENCOUNTER — Other Ambulatory Visit: Payer: Medicare Other

## 2020-10-04 ENCOUNTER — Other Ambulatory Visit: Payer: Self-pay

## 2020-10-04 ENCOUNTER — Inpatient Hospital Stay (HOSPITAL_BASED_OUTPATIENT_CLINIC_OR_DEPARTMENT_OTHER): Payer: Medicare Other | Admitting: Internal Medicine

## 2020-10-04 ENCOUNTER — Encounter: Payer: Self-pay | Admitting: Internal Medicine

## 2020-10-04 ENCOUNTER — Inpatient Hospital Stay: Payer: Medicare Other | Attending: Internal Medicine

## 2020-10-04 DIAGNOSIS — D473 Essential (hemorrhagic) thrombocythemia: Secondary | ICD-10-CM

## 2020-10-04 LAB — COMPREHENSIVE METABOLIC PANEL
ALT: 29 U/L (ref 0–44)
AST: 55 U/L — ABNORMAL HIGH (ref 15–41)
Albumin: 3.3 g/dL — ABNORMAL LOW (ref 3.5–5.0)
Alkaline Phosphatase: 184 U/L — ABNORMAL HIGH (ref 38–126)
Anion gap: 10 (ref 5–15)
BUN: 22 mg/dL (ref 8–23)
CO2: 30 mmol/L (ref 22–32)
Calcium: 8.9 mg/dL (ref 8.9–10.3)
Chloride: 97 mmol/L — ABNORMAL LOW (ref 98–111)
Creatinine, Ser: 0.78 mg/dL (ref 0.61–1.24)
GFR, Estimated: >60 mL/min (ref >60)
Glucose, Bld: 133 mg/dL — ABNORMAL HIGH (ref 70–99)
Potassium: 4.6 mmol/L (ref 3.5–5.1)
Sodium: 137 mmol/L (ref 135–145)
Total Bilirubin: 1 mg/dL (ref 0.3–1.2)
Total Protein: 6.8 g/dL (ref 6.5–8.1)

## 2020-10-04 LAB — CBC WITH DIFFERENTIAL/PLATELET
Abs Immature Granulocytes: 0.23 10*3/uL — ABNORMAL HIGH (ref 0.00–0.07)
Basophils Absolute: 0.3 10*3/uL — ABNORMAL HIGH (ref 0.0–0.1)
Basophils Relative: 1 %
Eosinophils Absolute: 0.3 10*3/uL (ref 0.0–0.5)
Eosinophils Relative: 1 %
HCT: 35.2 % — ABNORMAL LOW (ref 39.0–52.0)
Hemoglobin: 11.1 g/dL — ABNORMAL LOW (ref 13.0–17.0)
Immature Granulocytes: 1 %
Lymphocytes Relative: 9 %
Lymphs Abs: 1.9 10*3/uL (ref 0.7–4.0)
MCH: 26.6 pg (ref 26.0–34.0)
MCHC: 31.5 g/dL (ref 30.0–36.0)
MCV: 84.2 fL (ref 80.0–100.0)
Monocytes Absolute: 2.7 10*3/uL — ABNORMAL HIGH (ref 0.1–1.0)
Monocytes Relative: 12 %
Neutro Abs: 17.1 10*3/uL — ABNORMAL HIGH (ref 1.7–7.7)
Neutrophils Relative %: 76 %
Platelets: 1628 10*3/uL (ref 150–400)
RBC: 4.18 MIL/uL — ABNORMAL LOW (ref 4.22–5.81)
RDW: 24 % — ABNORMAL HIGH (ref 11.5–15.5)
Smear Review: INCREASED
WBC: 22.5 10*3/uL — ABNORMAL HIGH (ref 4.0–10.5)
nRBC: 0.1 % (ref 0.0–0.2)

## 2020-10-04 LAB — LACTATE DEHYDROGENASE: LDH: 589 U/L — ABNORMAL HIGH (ref 98–192)

## 2020-10-04 MED ORDER — ALBUTEROL SULFATE HFA 108 (90 BASE) MCG/ACT IN AERS: 1.0000 | INHALATION_SPRAY | Freq: Four times a day (QID) | RESPIRATORY_TRACT | 3 refills | Status: AC | PRN

## 2020-10-04 NOTE — Patient Instructions (Signed)
#   STOP ASPRIN  # take anagrelide 1 mg every other day.

## 2020-10-04 NOTE — Progress Notes (Signed)
[place] NOTE  Patient Care Team: Tracie Harrier, MD as PCP - General (Internal Medicine)  CHIEF COMPLAINTS/PURPOSE OF CONSULTATION: Essential thrombocytosis  HEMATOLOGY HISTORY   Oncology History Overview Note  # ESSENTIAL THROMBOCYTOSIS [platelets-600-700 since 2017 ; Hb-13; white count- 12]; 2019- CT-NEG for splenomegaly; June 2021-JAK-2 positive; Hydrea 500 mg BID; declines bone marrow biopsy [? PMF];STOPPED early JAN 2022- sec to INTOLERANCE [poor apetiite]   # JAN MID 2022- START ANAGRELIDE $RemoveBeforeDE'1mg'rIhkQbMiAnSUmQV$  BID; STOPPED after 2 weeks [sec to intolerance].   # SEP 2021-admission Covid; acute thigh hematoma [on eliquis; a.fib]; Eliquis discontinued  #Lung lesion-?  Prior history of TB/question malignancy [Dr.Aleskerov]  # HTN; CAD- S/p CABG 2003; BPH   Essential thrombocythemia ([place])  01/10/2020 Initial Diagnosis   Essential thrombocythemia ([place])    HISTORY OF PRESENTING ILLNESS:  [facility] Gordon 87 y.o.  male essential thrombocytosis JAK-2 positive on anagrelide is here for follow-up.  Patient stopped taking anagrelide in Jan 2022 because of poor tolerance.  He states his symptoms [poor taste/lack of appetite/worsening fatigue] have improved although not completely resolved since stopping anagrelide.  Denies any new onset of chest pain or shortness of breath.  Denies any burning pain any extremities.  No strokes.  Review of Systems  Constitutional: Positive for malaise/fatigue. Negative for chills, diaphoresis and fever.  HENT: Negative for nosebleeds and sore throat.   Eyes: Negative for double vision.  Respiratory: Negative for cough, hemoptysis and wheezing.   Cardiovascular: Negative for chest pain, palpitations, orthopnea and leg swelling.  Gastrointestinal: Negative for abdominal pain, blood in stool, constipation, diarrhea, heartburn, melena, nausea and vomiting.  Genitourinary: Negative for dysuria, frequency and urgency.  Musculoskeletal:  Negative for back pain and joint pain.  Skin: Negative.  Negative for itching and rash.  Neurological: Negative for dizziness, tingling, focal weakness, weakness and headaches.  Endo/Heme/Allergies: Does not bruise/bleed easily.  Psychiatric/Behavioral: Negative for depression. The patient has insomnia. The patient is not nervous/anxious.      MEDICAL HISTORY:  Past Medical History:  Diagnosis Date  . Hypertension   . Kidney stones     SURGICAL HISTORY: Past Surgical History:  Procedure Laterality Date  . CARDIAC SURGERY      SOCIAL HISTORY: Social History   Socioeconomic History  . Marital status: Married    Spouse name: Not on file  . Number of children: Not on file  . Years of education: Not on file  . Highest education level: Not on file  Occupational History  . Not on file  Tobacco Use  . Smoking status: Never Smoker  . Smokeless tobacco: Never Used  Vaping Use  . Vaping Use: Never used  Substance and Sexual Activity  . Alcohol use: No  . Drug use: Not on file  . Sexual activity: Not on file  Other Topics Concern  . Not on file  Social History Narrative   Retd. 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    Social Determinants of Health   Financial Resource Strain: Not on file  Food Insecurity: Not on file  Transportation Needs: Not on file  Physical Activity: Not on file  Stress: Not on file  Social Connections: Not on file  Intimate Partner Violence: Not on file    FAMILY HISTORY: History reviewed. No pertinent family history.  ALLERGIES:  has No Known Allergies.  MEDICATIONS:  Current Outpatient Medications  Medication Sig Dispense Refill  . anagrelide (AGRYLIN) 1 MG capsule Take  1 capsule (1 mg total) by mouth 2 (two) times daily. 60 capsule 3  . ascorbic acid (VITAMIN C) 500 MG tablet Take 1 tablet by mouth daily.    . digoxin (LANOXIN) 0.125 MG tablet Take 125 mcg by mouth daily.    . ergocalciferol  (VITAMIN D2) 1.25 MG (50000 UT) capsule Take 1 capsule by mouth once a week.    . furosemide (LASIX) 40 MG tablet Take 1 tablet by mouth daily.    . Ipratropium-Albuterol (COMBIVENT) 20-100 MCG/ACT AERS respimat Inhale 1 puff into the lungs every 6 (six) hours. 4 g 0  . iron polysaccharides (NIFEREX) 150 MG capsule Take 1 capsule by mouth daily.    . pantoprazole (PROTONIX) 40 MG tablet Take 1 tablet (40 mg total) by mouth 2 (two) times daily. 60 tablet 0  . tamsulosin (FLOMAX) 0.4 MG CAPS capsule Take 0.4 mg by mouth daily after supper.     Marland Kitchen albuterol (VENTOLIN HFA) 108 (90 Base) MCG/ACT inhaler Inhale 1-2 puffs into the lungs every 6 (six) hours as needed for wheezing or shortness of breath. 18 g 3  . metoprolol tartrate (LOPRESSOR) 25 MG tablet Take 0.5 tablets (12.5 mg total) by mouth 2 (two) times daily. 30 tablet 0   No current facility-administered medications for this visit.     PHYSICAL EXAMINATION:   Vitals:   10/04/20 1536  BP: 124/67  Pulse: 97  Temp: 98.2 F (36.8 C)  SpO2: 98%   Filed Weights   10/04/20 1536  Weight: 128 lb 8 oz (58.3 kg)    Physical Exam Constitutional:      Comments: Accompanied by son.. Walking independently.  HENT:     Head: Normocephalic and atraumatic.     Mouth/Throat:     Pharynx: No oropharyngeal exudate.  Eyes:     Pupils: Pupils are equal, round, and reactive to light.  Cardiovascular:     Rate and Rhythm: Normal rate and regular rhythm.  Pulmonary:     Effort: Pulmonary effort is normal. No respiratory distress.     Breath sounds: Normal breath sounds. No wheezing.  Abdominal:     General: Bowel sounds are normal. There is no distension.     Palpations: Abdomen is soft. There is no mass.     Tenderness: There is no abdominal tenderness. There is no guarding or rebound.  Musculoskeletal:        General: No tenderness. Normal range of motion.     Cervical back: Normal range of motion and neck supple.  Skin:    General: Skin  is warm.  Neurological:     Mental Status: He is alert and oriented to person, place, and time.  Psychiatric:        Mood and Affect: Affect normal.     LABORATORY DATA:  I have reviewed the data as listed Lab Results  Component Value Date   WBC 22.5 (H) 10/04/2020   HGB 11.1 (L) 10/04/2020   HCT 35.2 (L) 10/04/2020   MCV 84.2 10/04/2020   PLT 1,628 (HH) 10/04/2020   Recent Labs    04/25/20 1054 04/26/20 0614 04/27/20 0507 07/04/20 1308 09/18/20 1509 10/04/20 1523 10/27/2020 1102  NA 134* 133* 133*   < > 134* 137 134*  K 4.6 4.4 5.1   < > 4.2 4.6 4.9  CL 100 98 97*   < > 95* 97* 94*  CO2 _0 < > _1 GLUCOSE 142* 147* 148*   < >  158* 133* 138*  BUN 46* 37* 30*   < > 18 22 50*  CREATININE 0.93 0.89 0.97   < > 0.77 0.78 1.33*  CALCIUM 8.2* 8.1* 8.3*   < > 8.6* 8.9 9.2  GFRNONAA >60 >60 >60   < > >60 >60 51*  GFRAA >60 >60 >60  --   --   --   --   PROT 5.2* 4.9* 5.0*   < > 6.8 6.8 5.6*  ALBUMIN 2.8* 2.7* 2.8*   < > 3.2* 3.3* 2.8*  AST 37 31 29   < > 42* 55* 54*  ALT _0 < > 22 29 32  ALKPHOS 54 59 59   < > 150* 184* 159*  BILITOT 2.1* 2.1* 2.2*   < > 0.9 1.0 1.5*  BILIDIR  --   --   --   --   --   --  0.5*  IBILI  --   --   --   --   --   --  1.0*   < > = values in this interval not displayed.     DG Chest Portable 1 View  Result Date: 11/12/2020 CLINICAL DATA:  Cough and shortness of breath EXAM: PORTABLE CHEST 1 VIEW COMPARISON:  04/22/2020 FINDINGS: Bilateral pulmonary opacities are present. Small bilateral pleural effusions with bibasilar atelectasis. Suspect persistent loculated fluid at the apices and along the medial pleural surfaces. No pneumothorax. Similar cardiomediastinal contours. IMPRESSION: Bilateral pulmonary opacities may reflect multifocal pneumonia or edema. Small bilateral pleural effusions with bibasilar atelectasis. Suspect superimposed persistent apical and paramediastinal loculated fluid. Electronically Signed   By: Macy Mis M.D.   On: 11/16/2020 12:29    ASSESSMENT & PLAN:   Essential thrombocythemia [facility]) # Essential thrombocytosis-JAK-2 positive [? PMF-see below] Patient most recently on anagrelide 41m BID since mid January 2022; however patient stopped after taking for 2 weeks or so [due to intolerance-see below].   # Today platelet count is 1.6 million; hemoglobin 11 white count 22.  Patient reluctantly agrees to take anagrelide 1 pill every other day. Fatigue lack of appetite/poor taste-clinically not convinced the patient's symptoms are from anagrelide; in fact I suspect symptoms are from underlying MPN-strongly suspect myelofibrosis. Patient understands risk of stroke; for acute vascular event with elevated platelets.  # DISPOSITION: late afternoon appt # follow up in 4 weeks- MD;- cbc/cmp/LDH; Dr.B  Cc; Dr.Hande   GCammie Sickle MD 10/17/2020 12:45 PM

## 2020-10-04 NOTE — Assessment & Plan Note (Addendum)
#   Essential thrombocytosis-JAK-2 positive [? PMF-see below] Patient most recently on anagrelide 1mg  BID since mid January 2022; however patient stopped after taking for 2 weeks or so [due to intolerance-see below].   # Today platelet count is 1.6 million; hemoglobin 11 white count 22.  Patient reluctantly agrees to take anagrelide 1 pill every other day. Fatigue lack of appetite/poor taste-clinically not convinced the patient's symptoms are from anagrelide; in fact I suspect symptoms are from underlying MPN-strongly suspect myelofibrosis. Patient understands risk of stroke; for acute vascular event with elevated platelets.  # DISPOSITION: late afternoon appt # follow up in 4 weeks- MD;- cbc/cmp/LDH; Dr.B  Cc; Dr.Hande

## 2020-10-23 ENCOUNTER — Other Ambulatory Visit: Payer: Self-pay

## 2020-10-23 ENCOUNTER — Inpatient Hospital Stay: Payer: Medicare Other

## 2020-10-23 ENCOUNTER — Encounter: Payer: Self-pay | Admitting: Medical Oncology

## 2020-10-23 ENCOUNTER — Emergency Department: Payer: Medicare Other

## 2020-10-23 ENCOUNTER — Emergency Department
Admit: 2020-10-23 | Discharge: 2020-10-23 | Disposition: A | Discharge status: Home / Self Care | Payer: Medicare Other | Attending: Internal Medicine | Admitting: Internal Medicine

## 2020-10-23 ENCOUNTER — Inpatient Hospital Stay
Admission: EM | Admit: 2020-10-23 | Discharge: 2020-11-17 | DRG: 951 | Disposition: E | Payer: Medicare Other | Attending: Student | Admitting: Student

## 2020-10-23 DIAGNOSIS — E8770 Fluid overload, unspecified: Secondary | ICD-10-CM

## 2020-10-23 DIAGNOSIS — C7951 Secondary malignant neoplasm of bone: Secondary | ICD-10-CM | POA: Diagnosis present

## 2020-10-23 DIAGNOSIS — Z8701 Personal history of pneumonia (recurrent): Secondary | ICD-10-CM

## 2020-10-23 DIAGNOSIS — G9341 Metabolic encephalopathy: Secondary | ICD-10-CM | POA: Diagnosis present

## 2020-10-23 DIAGNOSIS — Z20822 Contact with and (suspected) exposure to covid-19: Secondary | ICD-10-CM | POA: Diagnosis not present

## 2020-10-23 DIAGNOSIS — Z79899 Other long term (current) drug therapy: Secondary | ICD-10-CM

## 2020-10-23 DIAGNOSIS — I4819 Other persistent atrial fibrillation: Secondary | ICD-10-CM | POA: Diagnosis not present

## 2020-10-23 DIAGNOSIS — Z87442 Personal history of urinary calculi: Secondary | ICD-10-CM

## 2020-10-23 DIAGNOSIS — I10 Essential (primary) hypertension: Secondary | ICD-10-CM | POA: Diagnosis not present

## 2020-10-23 DIAGNOSIS — Z7902 Long term (current) use of antithrombotics/antiplatelets: Secondary | ICD-10-CM

## 2020-10-23 DIAGNOSIS — J189 Pneumonia, unspecified organism: Secondary | ICD-10-CM | POA: Diagnosis not present

## 2020-10-23 DIAGNOSIS — Z515 Encounter for palliative care: Secondary | ICD-10-CM | POA: Diagnosis present

## 2020-10-23 DIAGNOSIS — Z8616 Personal history of COVID-19: Secondary | ICD-10-CM | POA: Diagnosis not present

## 2020-10-23 DIAGNOSIS — I959 Hypotension, unspecified: Secondary | ICD-10-CM | POA: Diagnosis present

## 2020-10-23 DIAGNOSIS — D696 Thrombocytopenia, unspecified: Secondary | ICD-10-CM | POA: Diagnosis present

## 2020-10-23 DIAGNOSIS — C7889 Secondary malignant neoplasm of other digestive organs: Secondary | ICD-10-CM | POA: Diagnosis present

## 2020-10-23 DIAGNOSIS — I313 Pericardial effusion (noninflammatory): Secondary | ICD-10-CM | POA: Diagnosis present

## 2020-10-23 DIAGNOSIS — J9621 Acute and chronic respiratory failure with hypoxia: Secondary | ICD-10-CM | POA: Diagnosis not present

## 2020-10-23 DIAGNOSIS — N400 Enlarged prostate without lower urinary tract symptoms: Secondary | ICD-10-CM | POA: Diagnosis present

## 2020-10-23 DIAGNOSIS — I314 Cardiac tamponade: Secondary | ICD-10-CM | POA: Diagnosis not present

## 2020-10-23 DIAGNOSIS — Z66 Do not resuscitate: Secondary | ICD-10-CM | POA: Diagnosis present

## 2020-10-23 DIAGNOSIS — C3411 Malignant neoplasm of upper lobe, right bronchus or lung: Secondary | ICD-10-CM | POA: Diagnosis not present

## 2020-10-23 DIAGNOSIS — C7800 Secondary malignant neoplasm of unspecified lung: Secondary | ICD-10-CM

## 2020-10-23 DIAGNOSIS — R778 Other specified abnormalities of plasma proteins: Secondary | ICD-10-CM | POA: Diagnosis not present

## 2020-10-23 DIAGNOSIS — R0602 Shortness of breath: Secondary | ICD-10-CM

## 2020-10-23 DIAGNOSIS — Z951 Presence of aortocoronary bypass graft: Secondary | ICD-10-CM

## 2020-10-23 DIAGNOSIS — C3490 Malignant neoplasm of unspecified part of unspecified bronchus or lung: Secondary | ICD-10-CM | POA: Diagnosis present

## 2020-10-23 DIAGNOSIS — D75839 Thrombocytosis, unspecified: Secondary | ICD-10-CM | POA: Diagnosis not present

## 2020-10-23 DIAGNOSIS — I2510 Atherosclerotic heart disease of native coronary artery without angina pectoris: Secondary | ICD-10-CM | POA: Diagnosis present

## 2020-10-23 DIAGNOSIS — R9389 Abnormal findings on diagnostic imaging of other specified body structures: Secondary | ICD-10-CM | POA: Diagnosis present

## 2020-10-23 DIAGNOSIS — I272 Pulmonary hypertension, unspecified: Secondary | ICD-10-CM | POA: Diagnosis not present

## 2020-10-23 DIAGNOSIS — D72829 Elevated white blood cell count, unspecified: Secondary | ICD-10-CM | POA: Diagnosis present

## 2020-10-23 DIAGNOSIS — C799 Secondary malignant neoplasm of unspecified site: Secondary | ICD-10-CM | POA: Diagnosis present

## 2020-10-23 DIAGNOSIS — N289 Disorder of kidney and ureter, unspecified: Secondary | ICD-10-CM | POA: Diagnosis not present

## 2020-10-23 DIAGNOSIS — I3139 Other pericardial effusion (noninflammatory): Secondary | ICD-10-CM

## 2020-10-23 DIAGNOSIS — Z79891 Long term (current) use of opiate analgesic: Secondary | ICD-10-CM

## 2020-10-23 LAB — BRAIN NATRIURETIC PEPTIDE: B Natriuretic Peptide: 230.2 pg/mL — ABNORMAL HIGH (ref 0.0–100.0)

## 2020-10-23 LAB — HEMOGLOBIN A1C
Hgb A1c MFr Bld: 5.4 % (ref 4.8–5.6)
Mean Plasma Glucose: 108.28 mg/dL

## 2020-10-23 LAB — CBC WITH DIFFERENTIAL/PLATELET
Abs Immature Granulocytes: 0.91 10*3/uL — ABNORMAL HIGH (ref 0.00–0.07)
Basophils Absolute: 0.3 10*3/uL — ABNORMAL HIGH (ref 0.0–0.1)
Basophils Relative: 1 %
Eosinophils Absolute: 0 10*3/uL (ref 0.0–0.5)
Eosinophils Relative: 0 %
HCT: 35.7 % — ABNORMAL LOW (ref 39.0–52.0)
Hemoglobin: 11.2 g/dL — ABNORMAL LOW (ref 13.0–17.0)
Immature Granulocytes: 2 %
Lymphocytes Relative: 7 %
Lymphs Abs: 3.5 10*3/uL (ref 0.7–4.0)
MCH: 26.2 pg (ref 26.0–34.0)
MCHC: 31.4 g/dL (ref 30.0–36.0)
MCV: 83.6 fL (ref 80.0–100.0)
Monocytes Absolute: 4.4 10*3/uL — ABNORMAL HIGH (ref 0.1–1.0)
Monocytes Relative: 9 %
Neutro Abs: 39.8 10*3/uL — ABNORMAL HIGH (ref 1.7–7.7)
Neutrophils Relative %: 81 %
Platelets: 1505 10*3/uL (ref 150–400)
RBC: 4.27 MIL/uL (ref 4.22–5.81)
RDW: 24.4 % — ABNORMAL HIGH (ref 11.5–15.5)
Smear Review: NORMAL
WBC: 49 10*3/uL — ABNORMAL HIGH (ref 4.0–10.5)
nRBC: 0.1 % (ref 0.0–0.2)

## 2020-10-23 LAB — RESP PANEL BY RT-PCR (FLU A&B, COVID) ARPGX2
Influenza A by PCR: NEGATIVE
Influenza B by PCR: NEGATIVE
SARS Coronavirus 2 by RT PCR: NEGATIVE

## 2020-10-23 LAB — HEPATIC FUNCTION PANEL
ALT: 32 U/L (ref 0–44)
AST: 54 U/L — ABNORMAL HIGH (ref 15–41)
Albumin: 2.8 g/dL — ABNORMAL LOW (ref 3.5–5.0)
Alkaline Phosphatase: 159 U/L — ABNORMAL HIGH (ref 38–126)
Bilirubin, Direct: 0.5 mg/dL — ABNORMAL HIGH (ref 0.0–0.2)
Indirect Bilirubin: 1 mg/dL — ABNORMAL HIGH (ref 0.3–0.9)
Total Bilirubin: 1.5 mg/dL — ABNORMAL HIGH (ref 0.3–1.2)
Total Protein: 5.6 g/dL — ABNORMAL LOW (ref 6.5–8.1)

## 2020-10-23 LAB — BLOOD GAS, ARTERIAL
Acid-Base Excess: 3.1 mmol/L — ABNORMAL HIGH (ref 0.0–2.0)
Bicarbonate: 31.9 mmol/L — ABNORMAL HIGH (ref 20.0–28.0)
FIO2: 1
O2 Saturation: 98.5 %
Patient temperature: 37
pCO2 arterial: 71 mmHg (ref 32.0–48.0)
pH, Arterial: 7.26 — ABNORMAL LOW (ref 7.350–7.450)
pO2, Arterial: 129 mmHg — ABNORMAL HIGH (ref 83.0–108.0)

## 2020-10-23 LAB — BASIC METABOLIC PANEL
Anion gap: 13 (ref 5–15)
BUN: 50 mg/dL — ABNORMAL HIGH (ref 8–23)
CO2: 27 mmol/L (ref 22–32)
Calcium: 9.2 mg/dL (ref 8.9–10.3)
Chloride: 94 mmol/L — ABNORMAL LOW (ref 98–111)
Creatinine, Ser: 1.33 mg/dL — ABNORMAL HIGH (ref 0.61–1.24)
GFR, Estimated: 51 mL/min — ABNORMAL LOW (ref >60)
Glucose, Bld: 138 mg/dL — ABNORMAL HIGH (ref 70–99)
Potassium: 4.9 mmol/L (ref 3.5–5.1)
Sodium: 134 mmol/L — ABNORMAL LOW (ref 135–145)

## 2020-10-23 LAB — PROCALCITONIN: Procalcitonin: 0.2 ng/mL

## 2020-10-23 LAB — TROPONIN I (HIGH SENSITIVITY)
Troponin I (High Sensitivity): 33 ng/L — ABNORMAL HIGH (ref <18)
Troponin I (High Sensitivity): 32 ng/L — ABNORMAL HIGH (ref <18)

## 2020-10-23 LAB — TSH: TSH: 1.943 u[IU]/mL (ref 0.350–4.500)

## 2020-10-23 LAB — LIPASE, BLOOD: Lipase: 22 U/L (ref 11–51)

## 2020-10-23 IMAGING — DX DG CHEST 1V PORT
1 series · 1 of 1 positions shown · non-contrast
Comparison: [DATE]

CLINICAL DATA: Cough and shortness of breath

EXAM:
PORTABLE CHEST 1 VIEW

[chest ap]
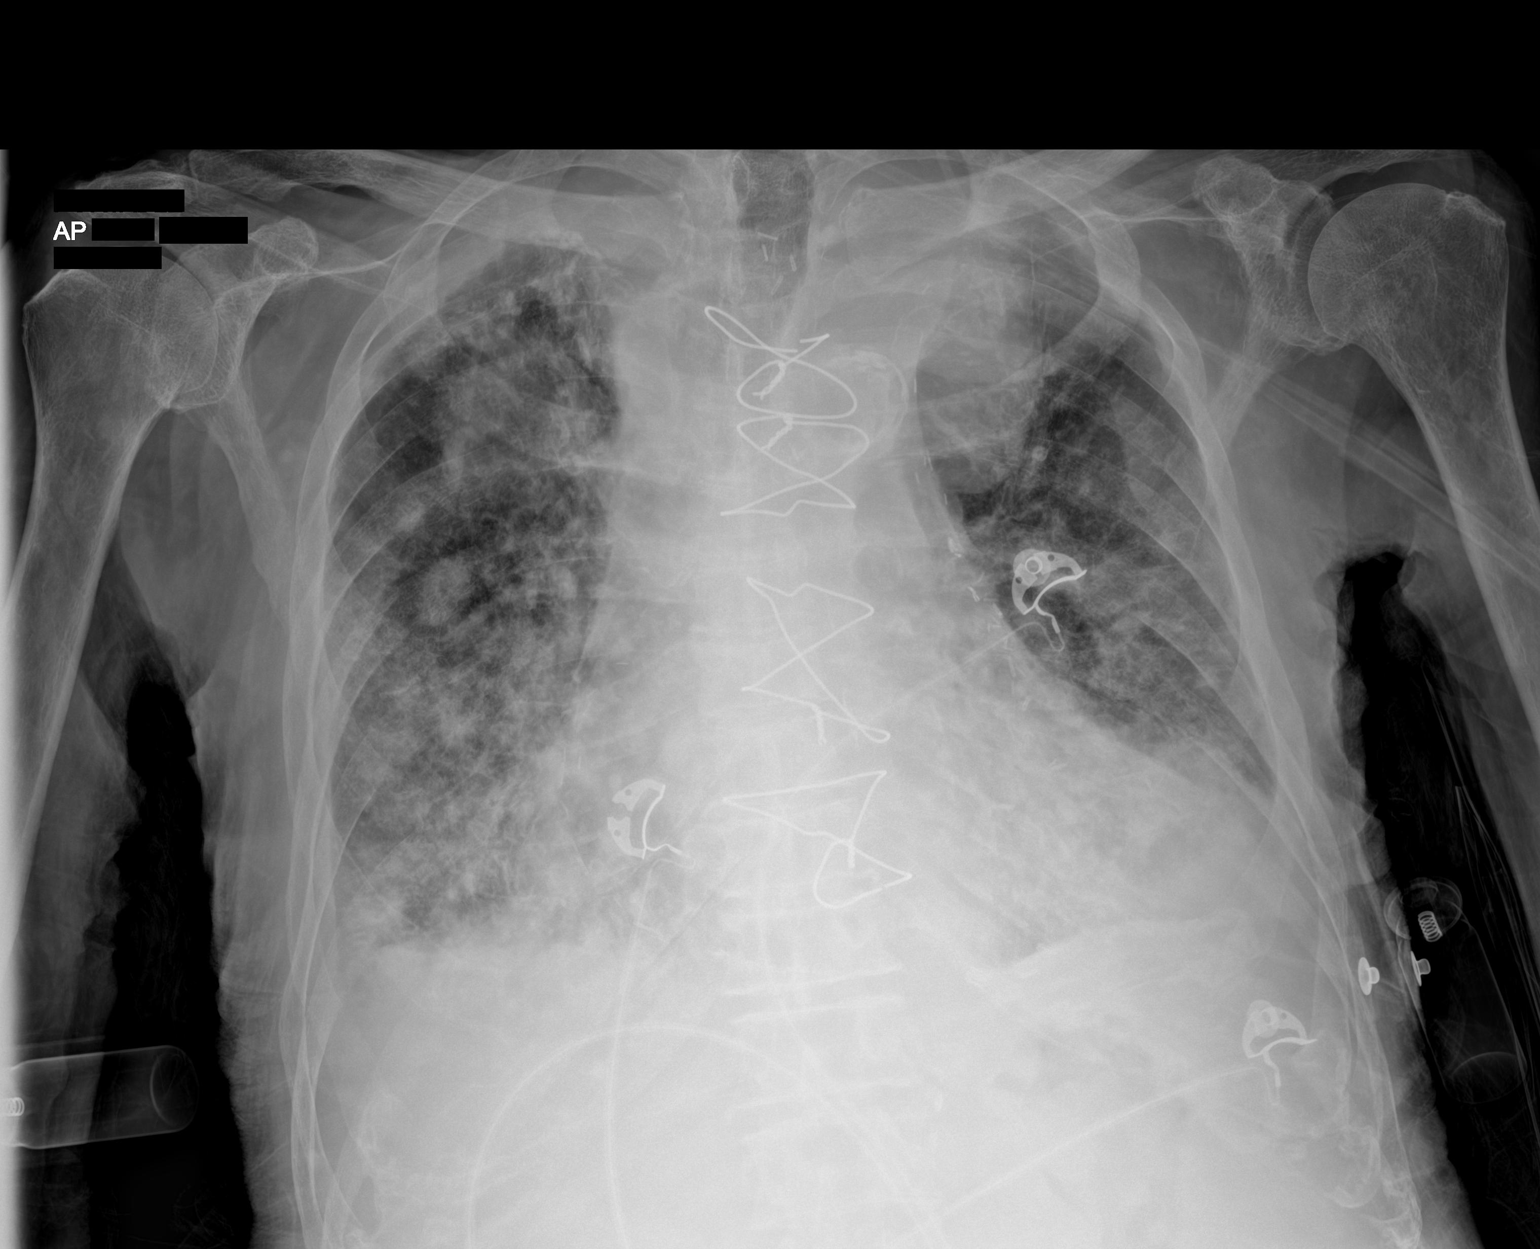

[1 of 1 positions shown; findings below may reference images not displayed]

FINDINGS: Bilateral pulmonary opacities are present. Small bilateral pleural
effusions with bibasilar atelectasis. Suspect persistent loculated
fluid at the apices and along the medial pleural surfaces. No
pneumothorax. Similar cardiomediastinal contours.
IMPRESSION: Bilateral pulmonary opacities may reflect multifocal pneumonia or
edema. Small bilateral pleural effusions with bibasilar atelectasis.
Suspect superimposed persistent apical and paramediastinal loculated
fluid.

## 2020-10-23 IMAGING — DX DG CHEST 1V PORT
1 series · 1 of 1 positions shown · non-contrast
Comparison: [DATE] at [DR] hours

CLINICAL DATA: Shortness of breath.

EXAM:
PORTABLE CHEST 1 VIEW

[chest ap]
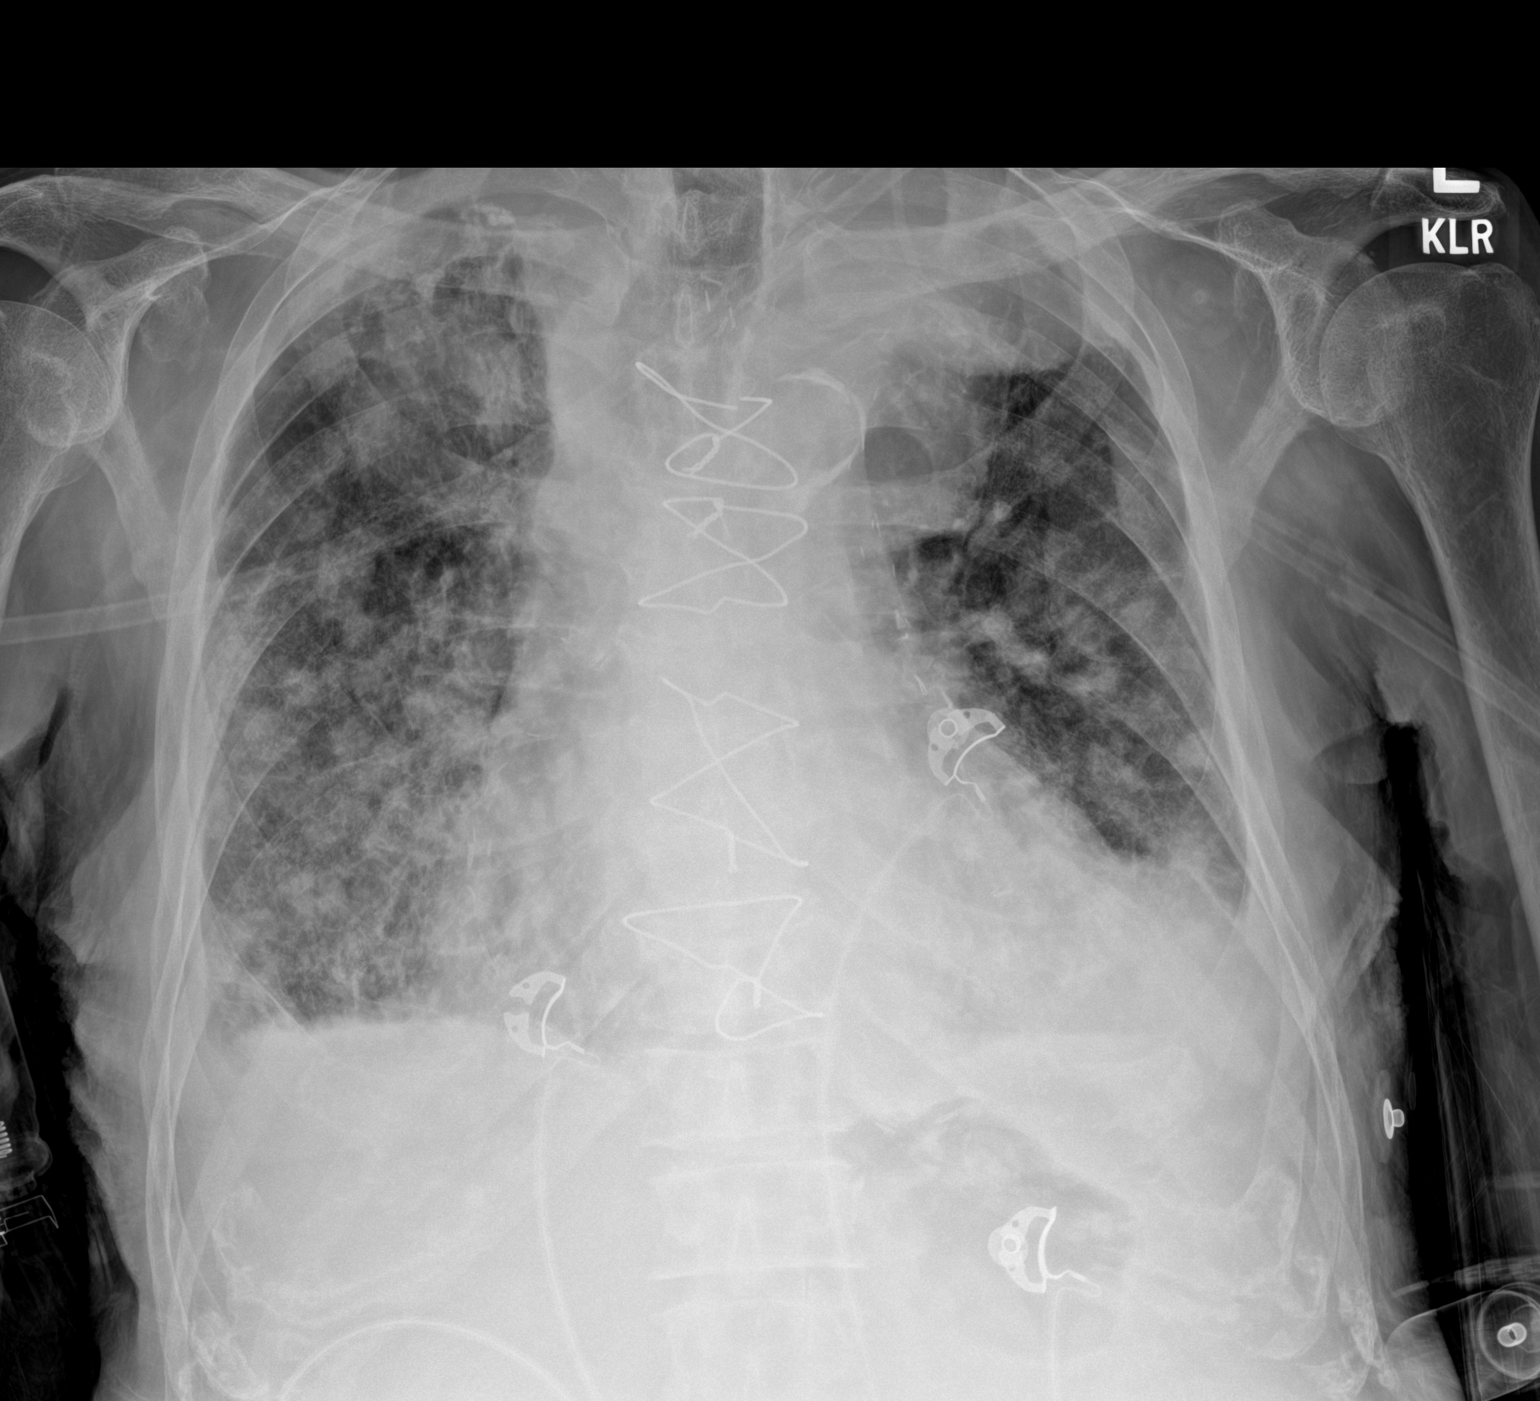

[1 of 1 positions shown; findings below may reference images not displayed]

FINDINGS: Prior median sternotomy is again noted. The cardiac silhouette
remains mildly enlarged. Aortic atherosclerosis is noted. Widespread
patchy airspace opacities throughout both lungs have not
significantly changed. There are persistent small bilateral layering
pleural effusions at the bases, and there is a similar appearance of
loculated pleural fluid at the lung apices and upper paramediastinal
regions, left greater than right. There are right upper lobe
calcifications. No pneumothorax is identified.
IMPRESSION: 1. Unchanged widespread bilateral pulmonary opacities which may
reflect pneumonia or edema with underlying metastatic disease also
possible.
2. Unchanged bilateral pleural effusions with loculated components
at the apices.

## 2020-10-23 MED ORDER — ASCORBIC ACID 500 MG PO TABS
500.0000 mg | ORAL_TABLET | Freq: Every day | ORAL | Status: DC
Start: 2020-10-24 — End: 2020-10-23

## 2020-10-23 MED ORDER — PANTOPRAZOLE SODIUM 40 MG PO TBEC
40.0000 mg | DELAYED_RELEASE_TABLET | Freq: Two times a day (BID) | ORAL | Status: DC
Start: 2020-10-23 — End: 2020-10-24

## 2020-10-23 MED ORDER — ONDANSETRON HCL 4 MG PO TABS: 4.0000 mg | ORAL_TABLET | Freq: Four times a day (QID) | ORAL | Status: DC | PRN

## 2020-10-23 MED ORDER — ACETAMINOPHEN 325 MG PO TABS: 650.0000 mg | ORAL_TABLET | Freq: Four times a day (QID) | ORAL | Status: DC | PRN

## 2020-10-23 MED ORDER — ALBUTEROL SULFATE HFA 108 (90 BASE) MCG/ACT IN AERS
1.0000 | INHALATION_SPRAY | Freq: Four times a day (QID) | RESPIRATORY_TRACT | Status: DC | PRN
Start: 2020-10-23 — End: 2020-10-24
  Filled 2020-10-23: qty 6.7

## 2020-10-23 MED ORDER — POLYSACCHARIDE IRON COMPLEX 150 MG PO CAPS
150.0000 mg | ORAL_CAPSULE | Freq: Every day | ORAL | Status: DC
Start: 2020-10-23 — End: 2020-10-24
  Filled 2020-10-23: qty 1

## 2020-10-23 MED ORDER — ACETAMINOPHEN 650 MG RE SUPP: 650.0000 mg | Freq: Four times a day (QID) | RECTAL | Status: DC | PRN

## 2020-10-23 MED ORDER — ONDANSETRON HCL 4 MG/2ML IJ SOLN: 4.0000 mg | Freq: Four times a day (QID) | INTRAMUSCULAR | Status: DC | PRN

## 2020-10-23 MED ORDER — ASPIRIN EC 81 MG PO TBEC: 81.0000 mg | DELAYED_RELEASE_TABLET | Freq: Every day | ORAL | Status: DC

## 2020-10-23 MED ORDER — MORPHINE SULFATE (PF) 2 MG/ML IV SOLN
2.0000 mg | Freq: Four times a day (QID) | INTRAVENOUS | Status: DC
  Administered 2020-10-24 (×2): 2 mg via INTRAVENOUS
  Filled 2020-10-23 (×2): qty 1

## 2020-10-23 MED ORDER — LORAZEPAM 2 MG/ML IJ SOLN
0.5000 mg | Freq: Four times a day (QID) | INTRAMUSCULAR | Status: DC | PRN
  Filled 2020-10-23: qty 1

## 2020-10-23 MED ORDER — MORPHINE SULFATE (PF) 2 MG/ML IV SOLN: 2.0000 mg | INTRAVENOUS | Status: DC | PRN

## 2020-10-23 MED ORDER — MORPHINE SULFATE (PF) 2 MG/ML IV SOLN
2.0000 mg | Freq: Once | INTRAVENOUS | Status: AC
Start: 2020-10-23 — End: 2020-10-23
  Administered 2020-10-23: 2 mg via INTRAVENOUS
  Filled 2020-10-23: qty 1

## 2020-10-23 MED ORDER — ERGOCALCIFEROL 1.25 MG (50000 UT) PO CAPS: 50000.0000 [IU] | ORAL_CAPSULE | ORAL | Status: DC

## 2020-10-23 MED ORDER — SCOPOLAMINE 1 MG/3DAYS TD PT72
1.0000 | MEDICATED_PATCH | TRANSDERMAL | Status: DC
  Administered 2020-10-23: 1.5 mg via TRANSDERMAL
  Filled 2020-10-23: qty 1

## 2020-10-23 MED ORDER — TRAMADOL HCL 50 MG PO TABS: 50.0000 mg | ORAL_TABLET | Freq: Two times a day (BID) | ORAL | Status: DC | PRN

## 2020-10-23 MED ORDER — LORAZEPAM 2 MG/ML IJ SOLN
0.5000 mg | Freq: Three times a day (TID) | INTRAMUSCULAR | Status: DC
  Administered 2020-10-24: 0.5 mg via INTRAVENOUS
  Filled 2020-10-23: qty 1

## 2020-10-23 MED ORDER — DIGOXIN 125 MCG PO TABS: 125.0000 ug | ORAL_TABLET | Freq: Every day | ORAL | Status: DC

## 2020-10-23 MED ORDER — MORPHINE SULFATE (PF) 2 MG/ML IV SOLN
2.0000 mg | Freq: Once | INTRAVENOUS | Status: AC
  Administered 2020-10-23: 2 mg via INTRAVENOUS
  Filled 2020-10-23: qty 1

## 2020-10-23 NOTE — ED Notes (Signed)
Dr Ayesha Rumpf again to come reassess and asked for prn orders to help with pts shob and air hunger.  2mg  morphine reordered Dr Cheri Fowler. Pt still c/o air hunger and asking for more o2. Pt continues to be 98% on NRB

## 2020-10-23 NOTE — ED Notes (Signed)
Pt sleeping at this time, NAD at this time. Pt's family told to use call bell if they see pt having any discomfort and nurses will continue to monitor pt for comfort.

## 2020-10-23 NOTE — Plan of Care (Signed)

## 2020-10-23 NOTE — ED Notes (Signed)
Lab contacted to add on hepatic panel and lipase

## 2020-10-23 NOTE — ED Notes (Signed)
Pt c/o pain to abdomen, back and legs. Dr Jacqualine Code aware and to place orders

## 2020-10-23 NOTE — ED Notes (Addendum)
Pt with sudden shob. Has been 95% on 4l [place], dropped to 84%. Pt stating I need oxygen. Placed on nrb, dr Marcello Moores notified of sudden change, stat cxray ordered, RT called for abg Dr Marcello Moores asked to come assess pt

## 2020-10-23 NOTE — ED Provider Notes (Incomplete)
[facility] Emergency Department Provider Note   ____________________________________________   Event Date/Time   First MD Initiated Contact with Patient 11/10/2020 1136     (approximate)  I have reviewed the triage vital signs and the nursing notes.   HISTORY  Chief Complaint Shortness of Breath  Interpreter offered, patient preference to use his family to interpret, in addition the patient does seem to understand some amount of English and medical terminology.  HPI Clinton Gordon is a 87 y.o. male history of hypertension, previous coronary bypass, thrombocytosis  Patient presents today, he started having shortness of breath increasing since last night.  The longest is he has a cough, some fatigue.  No pain or discomfort.  He has been having increased feeling of shortness of breath and also family and the patient noticed that he had swelling in both of his ankles today as well which is new   Past Medical History:  Diagnosis Date  . Hypertension   . Kidney stones     Patient Active Problem List   Diagnosis Date Noted  . Sepsis with acute hypoxic respiratory failure without septic shock ([place])   . Goals of care, counseling/discussion   . Palliative care by specialist   . Iliopsoas muscle hematoma 04/23/2020  . Pulmonary hypertension ([place]) 04/22/2020  . Lower extremity weakness 04/22/2020  . Thigh pain 04/22/2020  . Pneumonia due to COVID-19 virus 04/17/2020  . Hypertension   . BPH without obstruction/lower urinary tract symptoms   . Essential thrombocythemia ([place]) 01/10/2020    Past Surgical History:  Procedure Laterality Date  . CARDIAC SURGERY      Prior to Admission medications   Medication Sig Start Date End Date Taking? Authorizing Provider  albuterol (VENTOLIN HFA) 108 (90 Base) MCG/ACT inhaler Inhale 1-2 puffs into the lungs every 6 (six) hours as needed for wheezing or shortness of breath. 10/04/20   Cammie Sickle, MD   anagrelide (AGRYLIN) 1 MG capsule Take 1 capsule (1 mg total) by mouth 2 (two) times daily. 08/31/20   Cammie Sickle, MD  ascorbic acid (VITAMIN C) 500 MG tablet Take 1 tablet by mouth daily. 07/31/20 07/31/21  [provider]  digoxin (LANOXIN) 0.125 MG tablet Take 125 mcg by mouth daily. 07/31/20   [provider]  ergocalciferol (VITAMIN D2) 1.25 MG (50000 UT) capsule Take 1 capsule by mouth once a week. 07/31/20   [provider]  furosemide (LASIX) 40 MG tablet Take 1 tablet by mouth daily. 07/17/20 01/13/21  [provider]  Ipratropium-Albuterol (COMBIVENT) 20-100 MCG/ACT AERS respimat Inhale 1 puff into the lungs every 6 (six) hours. 04/27/20   Para Skeans, MD  iron polysaccharides (NIFEREX) 150 MG capsule Take 1 capsule by mouth daily. 07/31/20 07/31/21  [provider]  metoprolol tartrate (LOPRESSOR) 25 MG tablet Take 0.5 tablets (12.5 mg total) by mouth 2 (two) times daily. 04/27/20 05/27/20  Para Skeans, MD  pantoprazole (PROTONIX) 40 MG tablet Take 1 tablet (40 mg total) by mouth 2 (two) times daily. 04/27/20   Para Skeans, MD  tamsulosin (FLOMAX) 0.4 MG CAPS capsule Take 0.4 mg by mouth daily after supper.  12/16/19   [provider]    Allergies Patient has no known allergies.  No family history on file.  Social History Social History   Tobacco Use  . Smoking status: Never Smoker  . Smokeless tobacco: Never Used  Vaping Use  . Vaping Use: Never used  Substance Use  Topics  . Alcohol use: No    Review of Systems {** Revise as appropriate then delete this line - Documentation of 10 systems OR 2 systems and "10-point ROS otherwise negative" is required **}Constitutional: No fever/chills Eyes: No visual changes. ENT: No sore throat. Cardiovascular: Denies chest pain. Respiratory: Denies shortness of breath. Gastrointestinal: No abdominal pain.   Genitourinary: Negative for dysuria. Musculoskeletal: Negative  for back pain. Skin: Negative for rash. Neurological: Negative for headaches, areas of focal weakness or numbness.    ____________________________________________   PHYSICAL EXAM:  VITAL SIGNS: ED Triage Vitals  Enc Vitals Group     BP 11/11/2020 1104 113/63     Pulse Rate 11/15/2020 1104 84     Resp 11/09/2020 1104 18     Temp 11/12/2020 1104 97.7 F (36.5 C)     Temp Source 11/10/2020 1104 Oral     SpO2 11/05/2020 1104 96 %     Weight 10/22/2020 1105 127 lb 13.9 oz (58 kg)     Height 10/31/2020 1105 5\' 2"  (1.575 m)     Head Circumference --      Peak Flow --      Pain Score 11/02/2020 1105 0     Pain Loc --      Pain Edu? --      Excl. in [place]? --     Constitutional: Alert and oriented. Well appearing and in no acute distress. Eyes: Conjunctivae are normal. Head: Atraumatic. Nose: No congestion/rhinnorhea. Mouth/Throat: Mucous membranes are moist. Neck: No stridor.  Cardiovascular: Normal rate, regular rhythm. Grossly normal heart sounds.  Good peripheral circulation. Respiratory: Normal respiratory effort.  No retractions. Lungs CTAB. Gastrointestinal: Soft and nontender. No distention. {**Genitourinary:  **}Musculoskeletal: No lower extremity tenderness nor edema. Neurologic:  Normal speech and language. No gross focal neurologic deficits are appreciated.  Skin:  Skin is warm, dry and intact. No rash noted. Psychiatric: Mood and affect are normal. Speech and behavior are normal.  ____________________________________________   LABS (all labs ordered are listed, but only abnormal results are displayed)  Labs Reviewed  BASIC METABOLIC PANEL - Abnormal; Notable for the following components:      Result Value   Sodium 134 (*)    Chloride 94 (*)    Glucose, Bld 138 (*)    BUN 50 (*)    Creatinine, Ser 1.33 (*)    GFR, Estimated 51 (*)    All other components within normal limits  TROPONIN I (HIGH SENSITIVITY) - Abnormal; Notable for the following components:   Troponin I (High  Sensitivity) 33 (*)    All other components within normal limits  RESP PANEL BY RT-PCR (FLU A&B, COVID) ARPGX2  CBC WITH DIFFERENTIAL/PLATELET  BRAIN NATRIURETIC PEPTIDE  HEPATIC FUNCTION PANEL  LIPASE, BLOOD  PROCALCITONIN   ____________________________________________  EKG  *** ____________________________________________  RADIOLOGY  *** ____________________________________________   PROCEDURES  Procedure(s) performed: {Name/None:19197::"***, see procedure note(s).","None"}  Procedures  Critical Care performed: {CriticalCareYesNo:19197::"Yes, see critical care note(s)","No"}  ____________________________________________   INITIAL IMPRESSION / ASSESSMENT AND PLAN / ED COURSE  Pertinent labs & imaging results that were available during my care of the patient were reviewed by me and considered in my medical decision making (see chart for details).   ***      ____________________________________________   FINAL CLINICAL IMPRESSION(S) / ED DIAGNOSES  Final diagnoses:  None        Note:  This document was prepared using Dragon voice recognition software and may include unintentional dictation errors  {**  REMINDER - THIS NOTE IS NOT A FINAL MEDICAL RECORD UNTIL IT IS SIGNED.  UNTIL THEN, THE CONTENT BELOW MAY REFLECT INFORMATION FROM A DOCUMENTATION TEMPLATE, NOT THE ACTUAL PATIENT VISIT. **}

## 2020-10-23 NOTE — ED Notes (Signed)
Pt provided water as requested

## 2020-10-23 NOTE — ED Provider Notes (Signed)
[facility] Emergency Department Provider Note   ____________________________________________   Event Date/Time   First MD Initiated Contact with Patient 11/10/2020 1136     (approximate)  I have reviewed the triage vital signs and the nursing notes.   HISTORY  Chief Complaint Shortness of Breath  Interpreter offered, patient preference to use his family to interpret, in addition the patient does seem to understand some amount of English and medical terminology.  HPI Clinton Gordon is a 87 y.o. male history of hypertension, previous coronary bypass, thrombocytosis  Patient presents today, he started having shortness of breath increasing since last night.  The longest is he has a cough, some fatigue.  No pain or discomfort.  He has been having increased feeling of shortness of breath and also family and the patient noticed that he had swelling in both of his ankles today as well which is new   Past Medical History:  Diagnosis Date  . Hypertension   . Kidney stones     Patient Active Problem List   Diagnosis Date Noted  . Sepsis with acute hypoxic respiratory failure without septic shock ([place])   . Goals of care, counseling/discussion   . Palliative care by specialist   . Iliopsoas muscle hematoma 04/23/2020  . Pulmonary hypertension ([place]) 04/22/2020  . Lower extremity weakness 04/22/2020  . Thigh pain 04/22/2020  . Pneumonia due to COVID-19 virus 04/17/2020  . Hypertension   . BPH without obstruction/lower urinary tract symptoms   . Essential thrombocythemia ([place]) 01/10/2020    Past Surgical History:  Procedure Laterality Date  . CARDIAC SURGERY      Prior to Admission medications   Medication Sig Start Date End Date Taking? Authorizing Provider  albuterol (VENTOLIN HFA) 108 (90 Base) MCG/ACT inhaler Inhale 1-2 puffs into the lungs every 6 (six) hours as needed for wheezing or shortness of breath. 10/04/20   Cammie Sickle, MD   anagrelide (AGRYLIN) 1 MG capsule Take 1 capsule (1 mg total) by mouth 2 (two) times daily. 08/31/20   Cammie Sickle, MD  ascorbic acid (VITAMIN C) 500 MG tablet Take 1 tablet by mouth daily. 07/31/20 07/31/21  [provider]  digoxin (LANOXIN) 0.125 MG tablet Take 125 mcg by mouth daily. 07/31/20   [provider]  ergocalciferol (VITAMIN D2) 1.25 MG (50000 UT) capsule Take 1 capsule by mouth once a week. 07/31/20   [provider]  furosemide (LASIX) 40 MG tablet Take 1 tablet by mouth daily. 07/17/20 01/13/21  [provider]  Ipratropium-Albuterol (COMBIVENT) 20-100 MCG/ACT AERS respimat Inhale 1 puff into the lungs every 6 (six) hours. 04/27/20   Para Skeans, MD  iron polysaccharides (NIFEREX) 150 MG capsule Take 1 capsule by mouth daily. 07/31/20 07/31/21  [provider]  metoprolol tartrate (LOPRESSOR) 25 MG tablet Take 0.5 tablets (12.5 mg total) by mouth 2 (two) times daily. 04/27/20 05/27/20  Para Skeans, MD  pantoprazole (PROTONIX) 40 MG tablet Take 1 tablet (40 mg total) by mouth 2 (two) times daily. 04/27/20   Para Skeans, MD  tamsulosin (FLOMAX) 0.4 MG CAPS capsule Take 0.4 mg by mouth daily after supper.  12/16/19   [provider]    Allergies Patient has no known allergies.  No family history on file.  Social History Social History   Tobacco Use  . Smoking status: Never Smoker  . Smokeless tobacco: Never Used  Vaping Use  . Vaping Use: Never used  Substance Use  Topics  . Alcohol use: No    Review of Systems Constitutional: No fever/chills Eyes: No visual changes. ENT: No sore throat. Cardiovascular: Denies chest pain. Respiratory: Shortness of breath and a cough developing over the last day and a half Gastrointestinal: No abdominal pain.   Genitourinary: No pain or discomfort with urination Musculoskeletal: Negative for back pain.  New swelling in both feet over the last day Skin: Negative for  rash. Neurological: Negative for headaches, areas of focal weakness or numbness.    ____________________________________________   PHYSICAL EXAM:  VITAL SIGNS: ED Triage Vitals  Enc Vitals Group     BP 11/15/2020 1104 113/63     Pulse Rate 11/03/2020 1104 84     Resp 10/19/2020 1104 18     Temp 10/18/2020 1104 97.7 F (36.5 C)     Temp Source 10/30/2020 1104 Oral     SpO2 10/22/2020 1104 96 %     Weight 10/25/2020 1105 127 lb 13.9 oz (58 kg)     Height 10/27/2020 1105 5\' 2"  (1.575 m)     Head Circumference --      Peak Flow --      Pain Score 11/11/2020 1105 0     Pain Loc --      Pain Edu? --      Excl. in [place]? --     Constitutional: Alert and oriented.  Mildly ill-appearing, occasional cough, appears dyspneic with slight tachypnea and mild accessory use.  He is in no extremitas though. Eyes: Conjunctivae are normal. Head: Atraumatic. Nose: No congestion/rhinnorhea. Mouth/Throat: Mucous membranes are moist. Neck: No stridor.  Cardiovascular: Regular rate with some irregularity grossly normal heart sounds.  Good peripheral circulation. Respiratory: Slight crackles at the bases bilateral, some tachypnea, mild accessory muscle use.  No acute distress, but does appear dyspneic. Gastrointestinal: Soft and nontender. No distention. Musculoskeletal: No lower extremity tenderness with bilateral 3+ lower extremity edema Neurologic:  Normal speech and language. No gross focal neurologic deficits are appreciated.  Skin:  Skin is warm, dry and intact. No rash noted. Psychiatric: Mood and affect are normal. Speech and behavior are normal.  ____________________________________________   LABS (all labs ordered are listed, but only abnormal results are displayed)  Labs Reviewed  CBC WITH DIFFERENTIAL/PLATELET - Abnormal; Notable for the following components:      Result Value   WBC 49.0 (*)    Hemoglobin 11.2 (*)    HCT 35.7 (*)    RDW 24.4 (*)    Platelets 1,505 (*)    Neutro Abs 39.8 (*)     Monocytes Absolute 4.4 (*)    Basophils Absolute 0.3 (*)    Abs Immature Granulocytes 0.91 (*)    All other components within normal limits  BASIC METABOLIC PANEL - Abnormal; Notable for the following components:   Sodium 134 (*)    Chloride 94 (*)    Glucose, Bld 138 (*)    BUN 50 (*)    Creatinine, Ser 1.33 (*)    GFR, Estimated 51 (*)    All other components within normal limits  BRAIN NATRIURETIC PEPTIDE - Abnormal; Notable for the following components:   B Natriuretic Peptide 230.2 (*)    All other components within normal limits  HEPATIC FUNCTION PANEL - Abnormal; Notable for the following components:   Total Protein 5.6 (*)    Albumin 2.8 (*)    AST 54 (*)    Alkaline Phosphatase 159 (*)    Total Bilirubin 1.5 (*)  Bilirubin, Direct 0.5 (*)    Indirect Bilirubin 1.0 (*)    All other components within normal limits  TROPONIN I (HIGH SENSITIVITY) - Abnormal; Notable for the following components:   Troponin I (High Sensitivity) 33 (*)    All other components within normal limits  RESP PANEL BY RT-PCR (FLU A&B, COVID) ARPGX2  LIPASE, BLOOD  PROCALCITONIN  TROPONIN I (HIGH SENSITIVITY)   ____________________________________________  EKG  Reviewed inter by me at 11 AM Heart rate 90 QRS 130 QTc 540 Probable atrial fibrillation, right bundle branch block, mild nonspecific T wave abnormality.  EKG appears abnormal, I suspect A. fib as well.  Discussed with Dr. Clayborn Bigness ____________________________________________  RADIOLOGY  DG Chest Portable 1 View  Result Date: 11/05/2020 CLINICAL DATA:  Cough and shortness of breath EXAM: PORTABLE CHEST 1 VIEW COMPARISON:  04/22/2020 FINDINGS: Bilateral pulmonary opacities are present. Small bilateral pleural effusions with bibasilar atelectasis. Suspect persistent loculated fluid at the apices and along the medial pleural surfaces. No pneumothorax. Similar cardiomediastinal contours. IMPRESSION: Bilateral pulmonary opacities may  reflect multifocal pneumonia or edema. Small bilateral pleural effusions with bibasilar atelectasis. Suspect superimposed persistent apical and paramediastinal loculated fluid. Electronically Signed   By: Macy Mis M.D.   On: 11/03/2020 12:29    Chest x-ray reviewed, bilateral pulmonary opacities.  Small bilateral pleural effusions.  Also concern for loculated fluid ____________________________________________   PROCEDURES  Procedure(s) performed: None  Procedures  Critical Care performed: No  ____________________________________________   INITIAL IMPRESSION / ASSESSMENT AND PLAN / ED COURSE  Pertinent labs & imaging results that were available during my care of the patient were reviewed by me and considered in my medical decision making (see chart for details).   Patient presents for hypotension associated with appears to be volume overloaded with JVD peripheral edema.  In further review of his history and recent imaging studies I am very concerned that he has a pericardial effusion and also possible pulmonary metastases, he now presents with a white count of 49,000 as well.  I discussed this case with both cardiology and oncology, both of whom will see the patient and evaluate further today.  I discussed with the patient and also with his daughter-in-law at the bedside, and he clearly expresses his goals of care and does not wish for aggressive or heroic treatment or interventions.  Rather we will manage conservatively, obtain echo, admit to the hospitalist pending further results.  Clinical Course as of 11/03/2020 1325  Mon Oct 23, 2020  1246 Case discussed with Dr. Clayborn Bigness of cardiology, he will come and see the patient in consultation. [MQ]  1251 Discussed with Dr. Noemi Chapel (oncology) and reviewewed labs, past history, CT from November and today's presentation.  [MQ]  1304 Discussed with the patient as well as his daughter-in-law at the bedside.  Patient clearly expresses that  he is DO NOT RESUSCITATE, he also reports that he would not want any surgeries.  He tells me that he is good with [facility] pointing above, and is agreeable with being admitted to the hospital [MQ]  1321 Dr. Clayborn Bigness just evaluated the patient, advises that he is obtaining a stat echo and at this point would recommend against further diuresis.  Does recommend bladder scan as patient having difficulty with urination, I have ordered this, and also ordered In-N-Out catheterization to evaluate for possible urinary retention.  Discussed and patient will be admitted to Dr. Marcello Moores of the hospitalist service. [MQ]    Clinical Course User Index [MQ] Delman Kitten,  MD    Admission discussed and accepted by Dr. Marcello Moores ____________________________________________   FINAL CLINICAL IMPRESSION(S) / ED DIAGNOSES  Final diagnoses:  Hypotension, unspecified hypotension type  Hypervolemia, unspecified hypervolemia type  Pericardial effusion        Note:  This document was prepared using Dragon voice recognition software and may include unintentional dictation errors       Delman Kitten, MD 10/21/2020 1325

## 2020-10-23 NOTE — Consult Note (Signed)
CARDIOLOGY CONSULT NOTE               Patient ID: Clinton Gordon MRN: [id number] DOB/AGE: November 14, 1929 87 y.o.  Admit date: 11/16/2020 Referring Physician Dr Jacqualine Code ER Primary Physician Dr Ginette Pitman Primary Cardiologist Dr Jordan Hawks Reason for Consultation SOB/Pericardial Effusion  HPI: Pt has a history of multiple medical problems including CAD,CABG,SOB,Edema, pericardial effusion.  Patient presented with acute shortness of breath lower extremity edema weakness fatigue congestion.  Patient has been followed by oncology thrombocytopenia elevated white count probable metastatic cancer with abnormal chest x-ray MRI suggested pericardial effusion with loculated fluid.  Up until this point patient and family not wanting to pursue a more aggressive route and plan the conservative measures but he got more short of breath prompting him to come to the emergency room block as well as syncope no nausea vomiting fever chills or sweats  Review of systems complete and found to be negative unless listed above     Past Medical History:  Diagnosis Date  . Hypertension   . Kidney stones     Past Surgical History:  Procedure Laterality Date  . CARDIAC SURGERY      (Not in a hospital admission)  Social History   Socioeconomic History  . Marital status: Married    Spouse name: Not on file  . Number of children: Not on file  . Years of education: Not on file  . Highest education level: Not on file  Occupational History  . Not on file  Tobacco Use  . Smoking status: Never Smoker  . Smokeless tobacco: Never Used  Vaping Use  . Vaping Use: Never used  Substance and Sexual Activity  . Alcohol use: No  . Drug use: Not on file  . Sexual activity: Not on file  Other Topics Concern  . Not on file  Social History Narrative   Retd. 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    Social Determinants of Health   Financial Resource Strain: Not on file  Food  Insecurity: Not on file  Transportation Needs: Not on file  Physical Activity: Not on file  Stress: Not on file  Social Connections: Not on file  Intimate Partner Violence: Not on file    No family history on file.    Review of systems complete and found to be negative unless listed above      PHYSICAL EXAM  General: Well developed, well nourished, acute resp distress HEENT:  Normocephalic and atramatic Neck:  No JVD.  Lungs: diffuse rhonci  bilaterally to auscultation and percussion. Heart: HRRR . Normal S1 and S2 without gallops or murmurs.  Abdomen: Bowel sounds are positive, abdomen soft and non-tender  Msk:  Back normal, normal gait. Normal strength and tone for age. Extremities: No clubbing, cyanosis or edema.   Neuro: Alert and oriented X 3. Psych:  Good affect, responds appropriately  Labs:   Lab Results  Component Value Date   WBC 49.0 (H) 11/16/2020   HGB 11.2 (L) 10/18/2020   HCT 35.7 (L) 10/31/2020   MCV 83.6 11/02/2020   PLT 1,505 (HH) 11/14/2020    Recent Labs  Lab 10/17/2020 1102  NA 134*  K 4.9  CL 94*  CO2 27  BUN 50*  CREATININE 1.33*  CALCIUM 9.2  PROT 5.6*  BILITOT 1.5*  ALKPHOS 159*  ALT 32  AST 54*  GLUCOSE 138*   Lab Results  Component Value Date   CKTOTAL  65 04/30/2013   CKMB < 0.5 (L) 04/30/2013   TROPONINI < 0.02 05/01/2013   No results found for: CHOL No results found for: HDL No results found for: [facility] Lab Results  Component Value Date   TRIG 44 04/17/2020   No results found for: CHOLHDL No results found for: LDLDIRECT    Radiology: [facility] Chest Port 1 View  Result Date: 11/07/2020 CLINICAL DATA:  Shortness of breath. EXAM: PORTABLE CHEST 1 VIEW COMPARISON:  11/14/2020 at 1201 hours FINDINGS: Prior median sternotomy is again noted. The cardiac silhouette remains mildly enlarged. Aortic atherosclerosis is noted. Widespread patchy airspace opacities throughout both lungs have not significantly changed. There are persistent  small bilateral layering pleural effusions at the bases, and there is a similar appearance of loculated pleural fluid at the lung apices and upper paramediastinal regions, left greater than right. There are right upper lobe calcifications. No pneumothorax is identified. IMPRESSION: 1. Unchanged widespread bilateral pulmonary opacities which may reflect pneumonia or edema with underlying metastatic disease also possible. 2. Unchanged bilateral pleural effusions with loculated components at the apices. Electronically Signed   By: Logan Bores M.D.   On: 11/07/2020 17:39   DG Chest Portable 1 View  Result Date: 11/10/2020 CLINICAL DATA:  Cough and shortness of breath EXAM: PORTABLE CHEST 1 VIEW COMPARISON:  04/22/2020 FINDINGS: Bilateral pulmonary opacities are present. Small bilateral pleural effusions with bibasilar atelectasis. Suspect persistent loculated fluid at the apices and along the medial pleural surfaces. No pneumothorax. Similar cardiomediastinal contours. IMPRESSION: Bilateral pulmonary opacities may reflect multifocal pneumonia or edema. Small bilateral pleural effusions with bibasilar atelectasis. Suspect superimposed persistent apical and paramediastinal loculated fluid. Electronically Signed   By: Macy Mis M.D.   On: 11/07/2020 12:29    EKG: AFIB NSSTW 80  ASSESSMENT AND PLAN:  Shortness of breath Hypotension Pericardial effusion Respiratory failure Thrombocytopenia Multivessel coronary artery disease Coronary bypass surgery Loculated pericardial effusion Edema lower extremities Probable metastatic cancer Acute on chronic renal insuff . Plan Agree with admission to hospital Supplimental 02 for sob and hypoxemia Mild hydration for hypotension Agree with ECHO for evaluation of pericardial effusion Appreciate oncology input for metastatic disease I do not recommend pericardialcentisis for loculated effusion Recommend conservative cardiac input No invasive cardiac  procedure planted Consider nephology input for ARI/CRI  Signed: Yolonda Kida MD 11/10/2020, 6:58 PM

## 2020-10-23 NOTE — H&P (Signed)
History and Physical    Clinton Gordon [id number] DOB: 05/07/1930 DOA: 11/16/2020  PCP: Tracie Harrier, MD  Patient coming from: home  I have personally briefly reviewed patient's old medical records in [place]  Chief Complaint: cough and congestion  HPI: Clinton Gordon is a 87 y.o. male with medical history significant of  HTN,thrombocythemia on ASA,Afib on digoxin,, Anemia,CAD- S/p CABG 2003,BPH, history of COVID PNA 2021, renal stones, b/l lower extremity edema x 3-4 mo with assc sob, metastatic cancer possible lung primary with mets to bone,spleen. Of note patient has also had recent evaluation with cardiac MRI noting findings of large loculated pericardial effusions with areas of hemorrhagic conversion as well as finding of suggestive of tamponade. Patient presents to day with worsening sob and congestion from his baseline line x 24 hours. Per patient and family they area aware of his diagnosis and do not want further evaluation. They are seeking inpatient palliative care to help manage his end of life symptoms related to the progression of his metastatic disease. Patient currently somnolent and not able to give history , history is taken for Clinton Gordon:[phone] as son'swife.  Family in agreement with plan for comfort care and palliative care approach with hospice referral.    ED Course:  Vitals: 97.7, bp 113/65-95/53, hr 84, rr 18, sat 98%  Labs: NA 134, K 4.9, CL 94, glu 138, bun 50, cr 1.33 was 0.78 Ast54, ablumin 2.8,lipase 22, bnp 230 Wbc 49, hgb 11.2, at baseline, plt 1505 at baseline  Trop :33,32 QQV:[id number] pulmonary opacities may reflect multifocal pneumonia or edema. Small bilateral pleural effusions with bibasilar atelectasis. Suspect superimposed persistent apical and paramediastinal loculated fluid.  Review of Systems: As per HPI otherwise 10 point review of systems negative.   Past Medical History:  Diagnosis Date  . Hypertension   .  Kidney stones         Aneurysm of thoracic aorta (4 cm)  Past Surgical History:  Procedure Laterality Date  . CARDIAC SURGERY       reports that he has never smoked. He has never used smokeless tobacco. He reports that he does not drink alcohol. No history on file for drug use.  No Known Allergies  No family history on file.  Prior to Admission medications   Medication Sig Start Date End Date Taking? Authorizing Provider  albuterol (VENTOLIN HFA) 108 (90 Base) MCG/ACT inhaler Inhale 1-2 puffs into the lungs every 6 (six) hours as needed for wheezing or shortness of breath. 10/04/20  Yes Cammie Sickle, MD  anagrelide (AGRYLIN) 1 MG capsule Take 1 capsule (1 mg total) by mouth 2 (two) times daily. 08/31/20  Yes Cammie Sickle, MD  ascorbic acid (VITAMIN C) 500 MG tablet Take 1 tablet by mouth daily. 07/31/20 07/31/21 Yes [provider]  digoxin (LANOXIN) 0.125 MG tablet Take 125 mcg by mouth daily. 07/31/20  Yes [provider]  ergocalciferol (VITAMIN D2) 1.25 MG (50000 UT) capsule Take 1 capsule by mouth once a week. 07/31/20  Yes [provider]  furosemide (LASIX) 40 MG tablet Take 1 tablet by mouth daily. 07/17/20 01/13/21 Yes [provider]  iron polysaccharides (NIFEREX) 150 MG capsule Take 1 capsule by mouth daily. 07/31/20 07/31/21 Yes [provider]  pantoprazole (PROTONIX) 40 MG tablet Take 1 tablet (40 mg total) by mouth 2 (two) times daily. 04/27/20  Yes Para Skeans, MD  tamsulosin (FLOMAX) 0.4 MG CAPS capsule Take 0.4 mg by mouth  daily after supper.  12/16/19  Yes [provider]  traMADol (ULTRAM) 50 MG tablet Take by mouth. 10/19/20 11/18/20 Yes [provider]  Ipratropium-Albuterol (COMBIVENT) 20-100 MCG/ACT AERS respimat Inhale 1 puff into the lungs every 6 (six) hours. Patient not taking: No sig reported 04/27/20   Para Skeans, MD  metoprolol tartrate (LOPRESSOR) 25 MG tablet Take 0.5 tablets (12.5  mg total) by mouth 2 (two) times daily. 04/27/20 05/27/20  Para Skeans, MD    Physical Exam: Vitals:   11/08/2020 1230 11/01/2020 1300 10/20/2020 1400 10/25/2020 1430  BP: (!) 103/53 (!) 103/54 (!) 100/49   Pulse: 90 87 89 91  Resp: (!) 29 (!) 27 (!) 25 11  Temp:      TempSrc:      SpO2: 98% 100% 96% 95%  Weight:      Height:         Vitals:   10/22/2020 1230 11/15/2020 1300 11/02/2020 1400 11/09/2020 1430  BP: (!) 103/53 (!) 103/54 (!) 100/49   Pulse: 90 87 89 91  Resp: (!) 29 (!) 27 (!) 25 11  Temp:      TempSrc:      SpO2: 98% 100% 96% 95%  Weight:      Height:      Constitutional: calm, comfortable resting, somnolent Eyes: deferred ENMT: deferred Neck: normal, supple, no masses, no thyromegaly Respiratory: coarse bs anteriorly,  Normal respiratory effort. No accessory muscle use at this time Cardiovascular: Regular rate and rhythm, no murmurs / rubs / gallops. + extremity edema.  Abdomen: no tenderness, no masses palpated. No hepatosplenomegaly. Bowel sounds positive.  Musculoskeletal: no clubbing / cyanosis. No joint deformity upper and lower extremities.  Skin: no rashes, lesions, ulcers. No induration Neurologic: somnolent Psychiatric:unable to assess Labs on Admission: I have personally reviewed following labs and imaging studies  CBC: Recent Labs  Lab 10/26/2020 1102  WBC 49.0*  NEUTROABS 39.8*  HGB 11.2*  HCT 35.7*  MCV 83.6  PLT 0,923*   Basic Metabolic Panel: Recent Labs  Lab 10/19/2020 1102  NA 134*  K 4.9  CL 94*  CO2 27  GLUCOSE 138*  BUN 50*  CREATININE 1.33*  CALCIUM 9.2   GFR: Estimated Creatinine Clearance: 29.6 mL/min (A) (by C-G formula based on SCr of 1.33 mg/dL (H)). Liver Function Tests: Recent Labs  Lab 10/19/2020 1102  AST 54*  ALT 32  ALKPHOS 159*  BILITOT 1.5*  PROT 5.6*  ALBUMIN 2.8*   Recent Labs  Lab 10/19/2020 1102  LIPASE 22   No results for input(s): AMMONIA in the last 168 hours. Coagulation Profile: No results for  input(s): INR, PROTIME in the last 168 hours. Cardiac Enzymes: No results for input(s): CKTOTAL, CKMB, CKMBINDEX, TROPONINI in the last 168 hours. BNP (last 3 results) No results for input(s): PROBNP in the last 8760 hours. HbA1C: No results for input(s): HGBA1C in the last 72 hours. CBG: No results for input(s): GLUCAP in the last 168 hours. Lipid Profile: No results for input(s): CHOL, HDL, LDLCALC, TRIG, CHOLHDL, LDLDIRECT in the last 72 hours. Thyroid Function Tests: No results for input(s): TSH, T4TOTAL, FREET4, T3FREE, THYROIDAB in the last 72 hours. Anemia Panel: No results for input(s): VITAMINB12, FOLATE, FERRITIN, TIBC, IRON, RETICCTPCT in the last 72 hours. Urine analysis:    Component Value Date/Time   COLORURINE YELLOW (A) 04/17/2020 1305   APPEARANCEUR CLEAR (A) 04/17/2020 1305   APPEARANCEUR Clear 04/30/2013 2017   LABSPEC 1.011 04/17/2020 1305   LABSPEC  1.019 04/30/2013 2017   PHURINE 6.0 04/17/2020 1305   GLUCOSEU NEGATIVE 04/17/2020 1305   GLUCOSEU Negative 04/30/2013 2017   HGBUR NEGATIVE 04/17/2020 1305   BILIRUBINUR NEGATIVE 04/17/2020 1305   BILIRUBINUR Negative 04/30/2013 2017   KETONESUR NEGATIVE 04/17/2020 1305   PROTEINUR NEGATIVE 04/17/2020 1305   NITRITE NEGATIVE 04/17/2020 [place] 04/17/2020 1305   LEUKOCYTESUR Negative 04/30/2013 2017    Radiological Exams on Admission: DG Chest Portable 1 View  Result Date: 11/16/2020 CLINICAL DATA:  Cough and shortness of breath EXAM: PORTABLE CHEST 1 VIEW COMPARISON:  04/22/2020 FINDINGS: Bilateral pulmonary opacities are present. Small bilateral pleural effusions with bibasilar atelectasis. Suspect persistent loculated fluid at the apices and along the medial pleural surfaces. No pneumothorax. Similar cardiomediastinal contours. IMPRESSION: Bilateral pulmonary opacities may reflect multifocal pneumonia or edema. Small bilateral pleural effusions with bibasilar atelectasis. Suspect  superimposed persistent apical and paramediastinal loculated fluid. Electronically Signed   By: Macy Mis M.D.   On: 10/31/2020 12:29    EKG: Independently reviewed. Afib   Assessment/Plan  Acute on Chronic SOB due to progression of Metastatic disease vs superimposed pulmonary infection in setting of known Pericardial effusion with features of tamponade. -family desires comfort care -patient will be admit to medical floor  -palliative care consult requested  -appreciated oncology input  -for symptom management:ativan prn and standing, morphine prn and standing , scopalamine patch -monitor bladder scan q8h, place foley for retention -holding all medications for chronic conditions     DVT prophylaxis: none Code Status:  DNR/DNI Family Communication: n/a Disposition Plan: patient  expected to be admitted greater than 2 midnights Consults called: oncology/heme Brahmanday,palliative care Admission status: med/tele   Clance Boll MD [facility]   If 7PM-7AM, please contact night-coverage [email or web address] Password [email or web address]  11/11/2020, 2:50 PM

## 2020-10-23 NOTE — Consult Note (Signed)
[facility] NOTE  Patient Care Team: Tracie Harrier, MD as PCP - General (Internal Medicine)  CHIEF COMPLAINTS/PURPOSE OF CONSULTATION: Lung cancer/leukocytosis  HISTORY OF PRESENTING ILLNESS: Patient speaks limited English/poor historian accompanied by his daughter-in-law-presumably historian. Clinton Gordon 87 y.o.  male with no prior history of smoking; diagnosis of JAK2 positive myeloproliferative neoplasm is currently admitted to hospital for worsening shortness of breath/cough.  Of note patient had a chest CT scan in November 2021-that showed right upper lobe mass with loculated left upper lobe effusion/pericardial effusion/also splenic hilar mass all concerning for malignancy.  Patient/family had declined work-up/treatment options as per primary care physician.  Of note patient had followed up in the cancer center for myeloproliferative neoplasm-with poor tolerance to Hydrea/anagrelide.  Patient at this time is noted to have progressive shortness of breath-currently on nonrebreather.  Hypotensive-systolic 98X 211H.  Tachycardia in 110s.  Overall patient feels poorly.   Review of Systems  Unable to perform ROS: Severe respiratory distress     MEDICAL HISTORY:  Past Medical History:  Diagnosis Date  . Hypertension   . Kidney stones     SURGICAL HISTORY: Past Surgical History:  Procedure Laterality Date  . CARDIAC SURGERY      SOCIAL HISTORY: Social History   Socioeconomic History  . Marital status: Married    Spouse name: Not on file  . Number of children: Not on file  . Years of education: Not on file  . Highest education level: Not on file  Occupational History  . Not on file  Tobacco Use  . Smoking status: Never Smoker  . Smokeless tobacco: Never Used  Vaping Use  . Vaping Use: Never used  Substance and Sexual Activity  . Alcohol use: No  . Drug use: Not on file  . Sexual activity: Not on file  Other Topics Concern  . Not on  file  Social History Narrative   Retd. 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    Social Determinants of Health   Financial Resource Strain: Not on file  Food Insecurity: Not on file  Transportation Needs: Not on file  Physical Activity: Not on file  Stress: Not on file  Social Connections: Not on file  Intimate Partner Violence: Not on file    FAMILY HISTORY: No family history on file.  ALLERGIES:  has No Known Allergies.  MEDICATIONS:  Current Facility-Administered Medications  Medication Dose Route Frequency Provider Last Rate Last Admin  . acetaminophen (TYLENOL) tablet 650 mg  650 mg Oral Q6H PRN Clance Boll, MD       Or  . acetaminophen (TYLENOL) suppository 650 mg  650 mg Rectal Q6H PRN Clance Boll, MD      . albuterol (VENTOLIN HFA) 108 (90 Base) MCG/ACT inhaler 1-2 puff  1-2 puff Inhalation Q6H PRN Myles Rosenthal A, MD      . iron polysaccharides (NIFEREX) capsule 150 mg  150 mg Oral Daily Myles Rosenthal A, MD      . LORazepam (ATIVAN) injection 0.5 mg  0.5 mg Intravenous Q8H Myles Rosenthal A, MD      . LORazepam (ATIVAN) injection 0.5 mg  0.5 mg Intravenous Q6H PRN Myles Rosenthal A, MD      . morphine 2 MG/ML injection 2 mg  2 mg Intravenous Q6H Myles Rosenthal A, MD      . morphine 2 MG/ML injection 2 mg  2 mg Intravenous Q2H PRN Myles Rosenthal  A, MD      . ondansetron (ZOFRAN) tablet 4 mg  4 mg Oral Q6H PRN Clance Boll, MD       Or  . ondansetron [facility]) injection 4 mg  4 mg Intravenous Q6H PRN Clance Boll, MD      . pantoprazole (PROTONIX) EC tablet 40 mg  40 mg Oral BID Myles Rosenthal A, MD      . scopolamine (TRANSDERM-SCOP) 1 MG/3DAYS 1.5 mg  1 patch Transdermal Q72H Myles Rosenthal A, MD   1.5 mg at 10/29/2020 2100   Current Outpatient Medications  Medication Sig Dispense Refill  . albuterol (VENTOLIN HFA) 108 (90 Base) MCG/ACT inhaler Inhale 1-2 puffs into the lungs  every 6 (six) hours as needed for wheezing or shortness of breath. 18 g 3  . anagrelide (AGRYLIN) 1 MG capsule Take 1 capsule (1 mg total) by mouth 2 (two) times daily. 60 capsule 3  . ascorbic acid (VITAMIN C) 500 MG tablet Take 1 tablet by mouth daily.    . digoxin (LANOXIN) 0.125 MG tablet Take 125 mcg by mouth daily.    . ergocalciferol (VITAMIN D2) 1.25 MG (50000 UT) capsule Take 1 capsule by mouth once a week.    . furosemide (LASIX) 40 MG tablet Take 1 tablet by mouth daily.    . iron polysaccharides (NIFEREX) 150 MG capsule Take 1 capsule by mouth daily.    . pantoprazole (PROTONIX) 40 MG tablet Take 1 tablet (40 mg total) by mouth 2 (two) times daily. 60 tablet 0  . tamsulosin (FLOMAX) 0.4 MG CAPS capsule Take 0.4 mg by mouth daily after supper.     . traMADol (ULTRAM) 50 MG tablet Take by mouth.    . Ipratropium-Albuterol (COMBIVENT) 20-100 MCG/ACT AERS respimat Inhale 1 puff into the lungs every 6 (six) hours. (Patient not taking: No sig reported) 4 g 0  . metoprolol tartrate (LOPRESSOR) 25 MG tablet Take 0.5 tablets (12.5 mg total) by mouth 2 (two) times daily. 30 tablet 0      .  PHYSICAL EXAMINATION:  Vitals:   11/11/2020 2030 11/15/2020 2100  BP: (!) 93/48 (!) 97/47  Pulse: 98 97  Resp: 18 19  Temp:    SpO2: 98% 99%   Filed Weights   11/09/2020 1105  Weight: 127 lb 13.9 oz (58 kg)    Physical Exam Constitutional:      General: He is in acute distress.     Appearance: He is ill-appearing.     Comments: Patient in acute respiratory distress on NRB. Accompanied by family.  HENT:     Head: Normocephalic and atraumatic.     Mouth/Throat:     Mouth: Oropharynx is clear and moist.     Pharynx: No oropharyngeal exudate.  Eyes:     Pupils: Pupils are equal, round, and reactive to light.  Cardiovascular:     Rate and Rhythm: Regular rhythm. Tachycardia present.  Pulmonary:     Effort: No respiratory distress.     Breath sounds: No wheezing.     Comments: Bilateral  decreased/coarse breath sounds. Abdominal:     General: Bowel sounds are normal. There is no distension.     Palpations: Abdomen is soft. There is no mass.     Tenderness: There is no abdominal tenderness. There is no guarding or rebound.  Musculoskeletal:        General: No tenderness or edema. Normal range of motion.     Cervical back: Normal range of motion  and neck supple.  Skin:    General: Skin is warm.  Neurological:     Mental Status: He is alert and oriented to person, place, and time.  Psychiatric:        Mood and Affect: Affect normal.      LABORATORY DATA:  I have reviewed the data as listed Lab Results  Component Value Date   WBC 49.0 (H) 10/29/2020   HGB 11.2 (L) 10/25/2020   HCT 35.7 (L) 10/19/2020   MCV 83.6 10/30/2020   PLT 1,505 (HH) 10/22/2020   Recent Labs    04/25/20 1054 04/26/20 0614 04/27/20 0507 07/04/20 1308 09/18/20 1509 10/04/20 1523 10/18/2020 1102  NA 134* 133* 133*   < > 134* 137 134*  K 4.6 4.4 5.1   < > 4.2 4.6 4.9  CL 100 98 97*   < > 95* 97* 94*  CO2 26 27 29    < > 29 30 27   GLUCOSE 142* 147* 148*   < > 158* 133* 138*  BUN 46* 37* 30*   < > 18 22 50*  CREATININE 0.93 0.89 0.97   < > 0.77 0.78 1.33*  CALCIUM 8.2* 8.1* 8.3*   < > 8.6* 8.9 9.2  GFRNONAA >60 >60 >60   < > >60 >60 51*  GFRAA >60 >60 >60  --   --   --   --   PROT 5.2* 4.9* 5.0*   < > 6.8 6.8 5.6*  ALBUMIN 2.8* 2.7* 2.8*   < > 3.2* 3.3* 2.8*  AST 37 31 29   < > 42* 55* 54*  ALT 27 27 28    < > 22 29 32  ALKPHOS 54 59 59   < > 150* 184* 159*  BILITOT 2.1* 2.1* 2.2*   < > 0.9 1.0 1.5*  BILIDIR  --   --   --   --   --   --  0.5*  IBILI  --   --   --   --   --   --  1.0*   < > = values in this interval not displayed.    RADIOGRAPHIC STUDIES: I have personally reviewed the radiological images as listed and agreed with the findings in the report. DG Chest Port 1 View  Result Date: 11/02/2020 CLINICAL DATA:  Shortness of breath. EXAM: PORTABLE CHEST 1 VIEW COMPARISON:   11/06/2020 at 1201 hours FINDINGS: Prior median sternotomy is again noted. The cardiac silhouette remains mildly enlarged. Aortic atherosclerosis is noted. Widespread patchy airspace opacities throughout both lungs have not significantly changed. There are persistent small bilateral layering pleural effusions at the bases, and there is a similar appearance of loculated pleural fluid at the lung apices and upper paramediastinal regions, left greater than right. There are right upper lobe calcifications. No pneumothorax is identified. IMPRESSION: 1. Unchanged widespread bilateral pulmonary opacities which may reflect pneumonia or edema with underlying metastatic disease also possible. 2. Unchanged bilateral pleural effusions with loculated components at the apices. Electronically Signed   By: Logan Bores M.D.   On: 11/06/2020 17:39   DG Chest Portable 1 View  Result Date: 11/15/2020 CLINICAL DATA:  Cough and shortness of breath EXAM: PORTABLE CHEST 1 VIEW COMPARISON:  04/22/2020 FINDINGS: Bilateral pulmonary opacities are present. Small bilateral pleural effusions with bibasilar atelectasis. Suspect persistent loculated fluid at the apices and along the medial pleural surfaces. No pneumothorax. Similar cardiomediastinal contours. IMPRESSION: Bilateral pulmonary opacities may reflect multifocal pneumonia or edema. Small bilateral pleural  effusions with bibasilar atelectasis. Suspect superimposed persistent apical and paramediastinal loculated fluid. Electronically Signed   By: Macy Mis M.D.   On: 10/29/2020 12:29    Primary malignant neoplasm of right upper lobe of lung [facility]) #87 year old male patient history of JAK2 positive MPN-is currently admitted to hospital for worsening shortness of breath/leukocytosis thrombocytosis; chest x-ray concerning for progressive malignancy  # Right upper lobe mass; with loculated left upper lobe pleural effusion/pericardial effusion/perihilar splenic mass [CT scan  chest on 07/08/2020]-imaging findings very concerning for malignancy.  Discussed with Dr.Hande; patient/family had previously declined work-up.  #Leukocytosis/thrombocytosis-myeloproliferative neoplasm/underlying infection  #DNR/DNI  #Recommendations:.   #I had a long discussion with the patient's 2 sons/and also daughter-in-law by the bedside regarding patient's grave prognosis.  As patient/family declined any work-up/therapeutic/diagnostic procedures, I think is reasonable to proceed with comfort measures only.  Discussed with the patient's family that patient is actively dying.  They are interested in hospice at patient's home.  I would recommend hospice [patient home versus hospice home] if and when patient is clinically stable for discharge.  Recommend evaluation by [facility] palliative care to help with transition of care.  Thank you for allowing me to participate in the care of your pleasant patient. Please do not hesitate to contact me with questions or concerns in the interim. Discussed with Dr.Thomas; and Josh Borders.   # I reviewed the blood work- with the patient in detail; also reviewed the imaging independently [as summarized above]; and with the patient in detail.    All questions were answered. The patient knows to call the clinic with any problems, questions or concerns.    Cammie Sickle, MD 10/29/2020 9:31 PM

## 2020-10-23 NOTE — ED Notes (Signed)
Bladder scanner 48, pt denies needing to void at this time, Given ice water

## 2020-10-23 NOTE — ED Notes (Signed)
Lab contacted to add on labs

## 2020-10-23 NOTE — ED Notes (Signed)
X-ray at bedside

## 2020-10-23 NOTE — Progress Notes (Signed)
*  PRELIMINARY RESULTS* Echocardiogram 2D Echocardiogram has been performed.  Clinton Gordon 10/31/2020, 2:50 PM

## 2020-10-23 NOTE — Progress Notes (Addendum)
Admitted pt from the ED, transported via  [place], accompanied by RN and daughter in Sports coach. Pt is aox2, tired, denies any pain. Placed pt on 10 lpm via of NRB. Pt denies SOB and looks comfortable. Orientation given to daughter in law and pt. Fluids offered. Call bell placed within reach. Per [facility], they understood the DNR status and they want the  Pt be comfortable as possible. Pt has a PRN and RTC order of Ativan and Mso4 IV. Will continue to monitor.

## 2020-10-23 NOTE — ED Notes (Signed)
Daughter in law at bedside, states pt started having cough and shob this am. Pt noted to have congestion and cough on assessment. Pt in no distress

## 2020-10-23 NOTE — ED Notes (Signed)
Dr Marcello Moores at bedside to assess

## 2020-10-23 NOTE — ED Notes (Signed)
Pt c/o of continued air hunger, Dr Marcello Moores has been notified of all changes and asked to come reassess

## 2020-10-23 NOTE — ED Notes (Signed)
Pt MEWS red, informed floor RN that MD is aware of change in condition, will discuss with oncology and plan of care with pt

## 2020-10-23 NOTE — ED Notes (Signed)
Pts MAP staying around 35, Dr Jacqualine Code and Dr Cheri Fowler aware. No new orders

## 2020-10-23 NOTE — Assessment & Plan Note (Addendum)
#  87 year old male patient history of JAK2 positive MPN-is currently admitted to hospital for worsening shortness of breath/leukocytosis thrombocytosis; chest x-ray concerning for progressive malignancy  # Right upper lobe mass; with loculated left upper lobe pleural effusion/pericardial effusion/perihilar splenic mass [CT scan chest on 07/08/2020]-imaging findings very concerning for malignancy-patient/family had declined further work-up.  Agree unfortunately overall poor prognosis.  DNR/DNI.  Recommend continued comfort measures.  #As the patient is actively dying-I do not think patient is stable enough to be discharged home on hospice; or even hospice home.  I think is reasonable-transition nonrebreather to nasal cannula.  Discussed with [employer].  Discussed with patient daughter-in-law by bedside.

## 2020-10-23 NOTE — ED Notes (Signed)
Dr Jacqualine Code notified of platelets 1505, no new orders at this time

## 2020-10-24 DIAGNOSIS — E8770 Fluid overload, unspecified: Secondary | ICD-10-CM | POA: Diagnosis not present

## 2020-10-24 DIAGNOSIS — C3490 Malignant neoplasm of unspecified part of unspecified bronchus or lung: Secondary | ICD-10-CM | POA: Diagnosis present

## 2020-10-24 DIAGNOSIS — I2510 Atherosclerotic heart disease of native coronary artery without angina pectoris: Secondary | ICD-10-CM | POA: Diagnosis present

## 2020-10-24 DIAGNOSIS — R9389 Abnormal findings on diagnostic imaging of other specified body structures: Secondary | ICD-10-CM | POA: Diagnosis present

## 2020-10-24 DIAGNOSIS — J9621 Acute and chronic respiratory failure with hypoxia: Secondary | ICD-10-CM | POA: Diagnosis present

## 2020-10-24 DIAGNOSIS — I313 Pericardial effusion (noninflammatory): Secondary | ICD-10-CM | POA: Diagnosis present

## 2020-10-24 DIAGNOSIS — G9341 Metabolic encephalopathy: Secondary | ICD-10-CM | POA: Diagnosis present

## 2020-10-24 DIAGNOSIS — Z515 Encounter for palliative care: Secondary | ICD-10-CM | POA: Diagnosis not present

## 2020-10-24 DIAGNOSIS — I3139 Other pericardial effusion (noninflammatory): Secondary | ICD-10-CM | POA: Diagnosis present

## 2020-10-24 DIAGNOSIS — Z951 Presence of aortocoronary bypass graft: Secondary | ICD-10-CM

## 2020-10-24 DIAGNOSIS — C3411 Malignant neoplasm of upper lobe, right bronchus or lung: Secondary | ICD-10-CM | POA: Diagnosis not present

## 2020-10-24 DIAGNOSIS — I4819 Other persistent atrial fibrillation: Secondary | ICD-10-CM | POA: Diagnosis present

## 2020-10-24 LAB — ECHOCARDIOGRAM COMPLETE
AR max vel: 1.77 cm2
AV Area VTI: 1.91 cm2
AV Area mean vel: 1.84 cm2
AV Mean grad: 9 mmHg
AV Peak grad: 15.8 mmHg
Ao pk vel: 1.99 m/s
Area-P 1/2: 3.68 cm2
Height: 62 in
MV VTI: 2.04 cm2
S' Lateral: 2.5 cm
Weight: 2045.87 oz

## 2020-10-24 MED ORDER — LORAZEPAM 2 MG/ML IJ SOLN
0.5000 mg | INTRAMUSCULAR | Status: DC | PRN
  Administered 2020-10-24: 12:00:00 1 mg via INTRAVENOUS
  Filled 2020-10-24: qty 1

## 2020-10-24 MED ORDER — PANTOPRAZOLE SODIUM 40 MG IV SOLR: 40.0000 mg | Freq: Two times a day (BID) | INTRAVENOUS | Status: DC

## 2020-10-24 MED ORDER — MORPHINE SULFATE (PF) 2 MG/ML IV SOLN
2.0000 mg | INTRAVENOUS | Status: DC | PRN
  Administered 2020-10-24: 12:00:00 2 mg via INTRAVENOUS
  Filled 2020-10-24: qty 1

## 2020-10-24 MED ORDER — GLYCOPYRROLATE 0.2 MG/ML IJ SOLN: 0.1000 mg | INTRAMUSCULAR | Status: DC | PRN

## 2020-11-01 ENCOUNTER — Ambulatory Visit: Payer: Medicare Other | Admitting: Internal Medicine

## 2020-11-01 ENCOUNTER — Other Ambulatory Visit: Payer: Medicare Other

## 2020-11-17 NOTE — Progress Notes (Signed)
Clinton Gordon   DOB:May 26, 1930   YF#:[id number]    Subjective: Overnight patient received morphine IV for air hunger.  Is currently on nonrebreather.  Patient has been resting without any agitation.  His daughter-in-law is by the bedside.  Objective:  Vitals:   10/31/20 0308 October 31, 2020 0735  BP: (!) 87/45 (!) 81/43  Pulse: 89 (!) 103  Resp: (!) 24 (!) 21  Temp: (!) 97.4 F (36.3 C) 98.1 F (36.7 C)  SpO2: 94% 94%     Intake/Output Summary (Last 24 hours) at 10/31/20 1206 Last data filed at 10/29/2020 2245 Gross per 24 hour  Intake 0 ml  Output --  Net 0 ml    Physical Exam Constitutional:      Comments: Patient drowsy; on nonrebreather.   HENT:     Head: Normocephalic and atraumatic.  Eyes:     Pupils: Pupils are equal, round, and reactive to light.  Cardiovascular:     Rate and Rhythm: Normal rate and regular rhythm.  Pulmonary:     Effort: No respiratory distress.     Breath sounds: No wheezing.     Comments: Decreased breath sounds bilaterally.  Bilateral coarse breath sounds. Abdominal:     General: Bowel sounds are normal. There is no distension.     Palpations: Abdomen is soft. There is no mass.     Tenderness: There is no abdominal tenderness. There is no guarding or rebound.  Musculoskeletal:        General: No tenderness or edema. Normal range of motion.     Cervical back: Normal range of motion and neck supple.  Skin:    General: Skin is warm.  Psychiatric:        Mood and Affect: Affect normal.      Labs:  Lab Results  Component Value Date   WBC 49.0 (H) 11/08/2020   HGB 11.2 (L) 11/06/2020   HCT 35.7 (L) 10/22/2020   MCV 83.6 10/25/2020   PLT 1,505 (HH) 11/06/2020   NEUTROABS 39.8 (H) 11/03/2020    Lab Results  Component Value Date   NA 134 (L) 10/20/2020   K 4.9 11/13/2020   CL 94 (L) 10/19/2020   CO2 27 10/27/2020    Studies:  DG Chest Port 1 View  Result Date: 11/03/2020 CLINICAL DATA:  Shortness of breath. EXAM: PORTABLE CHEST 1  VIEW COMPARISON:  10/21/2020 at 1201 hours FINDINGS: Prior median sternotomy is again noted. The cardiac silhouette remains mildly enlarged. Aortic atherosclerosis is noted. Widespread patchy airspace opacities throughout both lungs have not significantly changed. There are persistent small bilateral layering pleural effusions at the bases, and there is a similar appearance of loculated pleural fluid at the lung apices and upper paramediastinal regions, left greater than right. There are right upper lobe calcifications. No pneumothorax is identified. IMPRESSION: 1. Unchanged widespread bilateral pulmonary opacities which may reflect pneumonia or edema with underlying metastatic disease also possible. 2. Unchanged bilateral pleural effusions with loculated components at the apices. Electronically Signed   By: Logan Bores M.D.   On: 11/12/2020 17:39   DG Chest Portable 1 View  Result Date: 11/16/2020 CLINICAL DATA:  Cough and shortness of breath EXAM: PORTABLE CHEST 1 VIEW COMPARISON:  04/22/2020 FINDINGS: Bilateral pulmonary opacities are present. Small bilateral pleural effusions with bibasilar atelectasis. Suspect persistent loculated fluid at the apices and along the medial pleural surfaces. No pneumothorax. Similar cardiomediastinal contours. IMPRESSION: Bilateral pulmonary opacities may reflect multifocal pneumonia or edema. Small bilateral pleural effusions with bibasilar  atelectasis. Suspect superimposed persistent apical and paramediastinal loculated fluid. Electronically Signed   By: Macy Mis M.D.   On: 11/10/2020 12:29    Primary malignant neoplasm of right upper lobe of lung [facility]) #87 year old Clinton patient history of JAK2 positive MPN-is currently admitted to hospital for worsening shortness of breath/leukocytosis thrombocytosis; chest x-ray concerning for progressive malignancy  # Right upper lobe mass; with loculated left upper lobe pleural effusion/pericardial effusion/perihilar splenic  mass [CT scan chest on 07/08/2020]-imaging findings very concerning for malignancy-patient/family had declined further work-up.  Agree unfortunately overall poor prognosis.  DNR/DNI.  Recommend continued comfort measures.  #As the patient is actively dying-I do not think patient is stable enough to be discharged home on hospice; or even hospice home.  I think is reasonable-transition nonrebreather to nasal cannula.  Discussed with [employer].  Discussed with patient daughter-in-law by bedside.   Cammie Sickle, MD 11/04/20  12:06 PM

## 2020-11-17 NOTE — Progress Notes (Signed)
Family request spouse have time with patient at this time. Will assess patient further at later time. Pt appears to be comfortable at this time.

## 2020-11-17 NOTE — Consult Note (Signed)
[place]  [phone] [phone]   Name: Clinton Gordon Date: 2020/10/31 MRN: [id number]  DOB: 28-Sep-1929  Patient Care Team: Tracie Harrier, MD as PCP - General (Internal Medicine)    REASON FOR CONSULTATION: Clinton Gordon is a 87 y.o. male with multiple medical problems including CAD status post CABG, hypertension, and JAK2 myeloproliferative neoplasm, who had had progressive weight loss and decline and received a CT of the chest in November 2021 that showed a right upper lobe mass with loculated left upper lobe effusion/pericardial effusion/splenic mass.  Patient and family ultimately declined further work-up or treatment.  Patient was admitted to the hospital on 11/06/2020 with hypoxic respiratory failure.  Reportedly, recent cardiac MRI revealed large loculated pericardial effusions with areas of hemorrhagic conversion and tamponade.  Family ultimately decided to pursue comfort care.  Palliative care was consulted to help with management.  SOCIAL HISTORY:     reports that he has never smoked. He has never used smokeless tobacco. He reports that he does not drink alcohol.  Patient is married.  He and his wife lives locally with son and daughter-in-law.  He has another son and daughter who are also involved in patient's care.  Patient is retired Chief Strategy Officer.  ADVANCE DIRECTIVES:  None on file  CODE STATUS: DNR  PAST MEDICAL HISTORY: Past Medical History:  Diagnosis Date  . Hypertension   . Kidney stones     PAST SURGICAL HISTORY:  Past Surgical History:  Procedure Laterality Date  . CARDIAC SURGERY      HEMATOLOGY/ONCOLOGY HISTORY:  Oncology History Overview Note  # ESSENTIAL THROMBOCYTOSIS [platelets-600-700 since 2017 ; Hb-13; white count- 12]; 2019- CT-NEG for splenomegaly; June 2021-JAK-2 positive; Hydrea 500 mg BID; declines bone marrow biopsy [? PMF];STOPPED early JAN 2022- sec to  INTOLERANCE [poor apetiite]   # JAN MID 2022- START ANAGRELIDE 14m BID; STOPPED after 2 weeks [sec to intolerance].   # SEP 2021-admission Covid; acute thigh hematoma [on eliquis; a.fib]; Eliquis discontinued  #Lung lesion-?  Prior history of TB/question malignancy [Dr.Aleskerov]  # HTN; CAD- S/p CABG 2003; BPH   [place]  [place] Initial Diagnosis   [place]     ALLERGIES:  has No Known Allergies.  MEDICATIONS:  Current Facility-Administered Medications  Medication Dose Route Frequency Provider Last Rate Last Admin  . glycopyrrolate (ROBINUL) injection 0.1 mg  0.1 mg Intravenous Q4H PRN Borders, JKirt Boys NP      . LORazepam (ATIVAN) injection 0.5 mg  0.5 mg Intravenous Q8H TMyles RosenthalA, MD   0.5 mg at 0Mar 15, 20220230  . LORazepam (ATIVAN) injection 0.5-1 mg  0.5-1 mg Intravenous Q4H PRN Borders, JKirt Boys NP      . morphine 2 MG/ML injection 2 mg  2 mg Intravenous Q6H TMyles RosenthalA, MD   2 mg at 02022/03/150612  . morphine 2 MG/ML injection 2-4 mg  2-4 mg Intravenous Q1H PRN Borders, JKirt Boys NP      . ondansetron (ZOFRAN) injection 4 mg  4 mg Intravenous Q6H PRN TMyles RosenthalA, MD      . scopolamine (TRANSDERM-SCOP) 1 MG/3DAYS 1.5 mg  1 patch Transdermal Q72H TMyles RosenthalA, MD   1.5 mg at 10/22/2020 2100    VITAL SIGNS: BP (!) 81/43 (BP Location: Left Arm)   Pulse (!) 103   Temp 98.1 F (36.7 C)   Resp (!) 21   Ht '5\' 2"'  (1.575 m)  Wt 127 lb 13.9 oz (58 kg)   SpO2 94%   BMI 23.39 kg/m  Filed Weights   11/04/2020 1105  Weight: 127 lb 13.9 oz (58 kg)    Estimated body mass index is 23.39 kg/m as calculated from the following:   Height as of this encounter: '5\' 2"'  (1.575 m).   Weight as of this encounter: 127 lb 13.9 oz (58 kg).  LABS: CBC:    Component Value Date/Time   WBC 49.0 (H) 10/23/2020 1102   HGB 11.2 (L) 11/03/2020 1102   HGB 15.6 04/30/2013 2017   HCT 35.7 (L) 11/04/2020 1102   HCT  45.2 04/30/2013 2017   PLT 1,505 (HH) 11/15/2020 1102   PLT 389 04/30/2013 2017   MCV 83.6 11/11/2020 1102   MCV 86 04/30/2013 2017   NEUTROABS 39.8 (H) 11/10/2020 1102   LYMPHSABS 3.5 11/11/2020 1102   MONOABS 4.4 (H) 10/18/2020 1102   EOSABS 0.0 10/20/2020 1102   BASOSABS 0.3 (H) 11/15/2020 1102   Comprehensive Metabolic Panel:    Component Value Date/Time   NA 134 (L) 10/19/2020 1102   NA 136 04/30/2013 2017   K 4.9 10/29/2020 1102   K 4.0 04/30/2013 2017   CL 94 (L) 11/15/2020 1102   CL 101 04/30/2013 2017   CO2 27 10/28/2020 1102   CO2 30 04/30/2013 2017   BUN 50 (H) 10/31/2020 1102   BUN 12 04/30/2013 2017   CREATININE 1.33 (H) 11/16/2020 1102   CREATININE 0.89 04/30/2013 2017   GLUCOSE 138 (H) 10/30/2020 1102   GLUCOSE 97 04/30/2013 2017   CALCIUM 9.2 10/27/2020 1102   CALCIUM 9.6 04/30/2013 2017   AST 54 (H) 11/14/2020 1102   AST 28 04/30/2013 2017   ALT 32 11/10/2020 1102   ALT 27 04/30/2013 2017   ALKPHOS 159 (H) 10/22/2020 1102   ALKPHOS 91 04/30/2013 2017   BILITOT 1.5 (H) 10/17/2020 1102   BILITOT 1.2 (H) 04/30/2013 2017   PROT 5.6 (L) 11/06/2020 1102   PROT 7.9 04/30/2013 2017   ALBUMIN 2.8 (L) 11/06/2020 1102   ALBUMIN 3.9 04/30/2013 2017    RADIOGRAPHIC STUDIES: DG Chest Port 1 View  Result Date: 10/21/2020 CLINICAL DATA:  Shortness of breath. EXAM: PORTABLE CHEST 1 VIEW COMPARISON:  11/07/2020 at 1201 hours FINDINGS: Prior median sternotomy is again noted. The cardiac silhouette remains mildly enlarged. Aortic atherosclerosis is noted. Widespread patchy airspace opacities throughout both lungs have not significantly changed. There are persistent small bilateral layering pleural effusions at the bases, and there is a similar appearance of loculated pleural fluid at the lung apices and upper paramediastinal regions, left greater than right. There are right upper lobe calcifications. No pneumothorax is identified. IMPRESSION: 1. Unchanged widespread  bilateral pulmonary opacities which may reflect pneumonia or edema with underlying metastatic disease also possible. 2. Unchanged bilateral pleural effusions with loculated components at the apices. Electronically Signed   By: Logan Bores M.D.   On: 10/17/2020 17:39   DG Chest Portable 1 View  Result Date: 10/22/2020 CLINICAL DATA:  Cough and shortness of breath EXAM: PORTABLE CHEST 1 VIEW COMPARISON:  04/22/2020 FINDINGS: Bilateral pulmonary opacities are present. Small bilateral pleural effusions with bibasilar atelectasis. Suspect persistent loculated fluid at the apices and along the medial pleural surfaces. No pneumothorax. Similar cardiomediastinal contours. IMPRESSION: Bilateral pulmonary opacities may reflect multifocal pneumonia or edema. Small bilateral pleural effusions with bibasilar atelectasis. Suspect superimposed persistent apical and paramediastinal loculated fluid. Electronically Signed   By: Addison Lank.D.  On: 10/29/2020 12:29    PERFORMANCE STATUS (ECOG) : 4 - Bedbound  Review of Systems Unable to complete  Physical Exam General: Critically ill-appearing Pulmonary: Shallow breathing on nonrebreather Extremities: edema, no joint deformities Skin: no rashes Neurological: Unresponsive  IMPRESSION: Patient appears actively terminal.  He is currently on a nonrebreather mask.  Breathing is shallow and rapid at times.  Patient has been receiving accommodation of scheduled and as needed morphine for comfort.  I met with patient's son and daughter-in-law at bedside.  Family confirmed that their goal is to ensure patient is comfortable until his end-of-life.  They are awaiting the arrival of patient's wife and other children and then family is interested in removing the nonrebreather mask.  We discussed applying a nasal cannula 1 to 2 L for comfort.  I offered to start a morphine infusion but family declined.  I did liberalize patient's comfort medications and discontinued  noncomfort meds.  Patient does not appear stable to consider transfer to a hospice facility.  Family is also not interested in transfer.  Anticipate in-hospital death. Prognosis likely hours vs days.   Family was interested in speaking with the chaplain.  Will consult.  PLAN: -Continue comfort care -Discontinue noncomfort meds -Liberalize frequency and dose range for morphine and lorazepam -Add glycopyrrolate as needed for secretions -Discontinue nonrebreather when family is ready -Chaplain consult  Case and plan discussed with nursing and Drs. Cyndia Skeeters and Brahmanday   Time Total: 60 minutes  Visit consisted of counseling and education dealing with the complex and emotionally intense issues of symptom management and palliative care in the setting of serious and potentially life-threatening illness.Greater than 50%  of this time was spent counseling and coordinating care related to the above assessment and plan.  Signed by: Altha Harm, PhD, NP-C

## 2020-11-17 NOTE — Death Summary Note (Signed)
DEATH SUMMARY   Patient Details  Name: Clinton Gordon MRN: [id number] DOB: 08-04-1930  Admission/Discharge Information   Admit Date:  15-Nov-2020  Date of Death: Date of Death: 11-16-20  Time of Death: Time of Death: 11-19-1435  Length of Stay: 1  Referring Physician: Tracie Harrier, MD   Reason(s) for Hospitalization  Increased shortness of breath and congestion  Diagnoses  Preliminary cause of death: Admission for end of life care Secondary Diagnoses (including complications and co-morbidities):  Active Problems:   Metastatic cancer ([place])   Primary malignant neoplasm of right upper lobe of lung (HCC)   Hypervolemia   End of life care   Metastatic lung cancer (metastasis from lung to other site) [facility])   Pericardial effusion with cardiac tamponade   Persistent atrial fibrillation (HCC)   Acute on chronic respiratory failure with hypoxia (HCC)   Acute metabolic encephalopathy   Hx of CABG   CAD (coronary artery disease)   Abnormal chest x-ray   Brief Hospital Course (including significant findings, care, treatment, and services provided and events leading to death)  Clinton Gordon is a 87 y.o. year old male with PMH of metastatic lung cancer, pericardial effusion with tamponade, CAD s/p CABG in 11-18-01, pulmonary hypertension, persistent A. fib, hypertension and chronic thrombocytosis brought to ED due to progressive shortness of breath, cough, congestion, encephalopathy and respiratory failure with hypoxia.    Work-up in ED significant for leukocytosis to 49 with left shift and thrombocytosis to 1500.  CXR showed bilateral pulmonary opacities concerning for multifocal pneumonia or edema.  He has mildly elevated troponin to 30s.  After meeting with cardiology and oncology, family opted for care with emphasis on patient's comfort.  Palliative medicine consulted, and end-of-life care initiated.  Patient passed away on 2020-11-16 at 02:37 PM with family at bedside.  Pertinent Labs and  Studies  Significant Diagnostic Studies DG Chest Port 1 View  Result Date: 11/15/2020 CLINICAL DATA:  Shortness of breath. EXAM: PORTABLE CHEST 1 VIEW COMPARISON:  2020-11-15 at 1201 hours FINDINGS: Prior median sternotomy is again noted. The cardiac silhouette remains mildly enlarged. Aortic atherosclerosis is noted. Widespread patchy airspace opacities throughout both lungs have not significantly changed. There are persistent small bilateral layering pleural effusions at the bases, and there is a similar appearance of loculated pleural fluid at the lung apices and upper paramediastinal regions, left greater than right. There are right upper lobe calcifications. No pneumothorax is identified. IMPRESSION: 1. Unchanged widespread bilateral pulmonary opacities which may reflect pneumonia or edema with underlying metastatic disease also possible. 2. Unchanged bilateral pleural effusions with loculated components at the apices. Electronically Signed   By: Logan Bores M.D.   On: Nov 15, 2020 17:39   DG Chest Portable 1 View  Result Date: 2020-11-15 CLINICAL DATA:  Cough and shortness of breath EXAM: PORTABLE CHEST 1 VIEW COMPARISON:  04/22/2020 FINDINGS: Bilateral pulmonary opacities are present. Small bilateral pleural effusions with bibasilar atelectasis. Suspect persistent loculated fluid at the apices and along the medial pleural surfaces. No pneumothorax. Similar cardiomediastinal contours. IMPRESSION: Bilateral pulmonary opacities may reflect multifocal pneumonia or edema. Small bilateral pleural effusions with bibasilar atelectasis. Suspect superimposed persistent apical and paramediastinal loculated fluid. Electronically Signed   By: Macy Mis M.D.   On: 11-15-20 12:29    Microbiology Recent Results (from the past 240 hour(s))  Resp Panel by RT-PCR (Flu A&B, Covid) Nasopharyngeal Swab     Status: None   Collection Time: 11-15-20 12:29 PM   Specimen: Nasopharyngeal Swab; Nasopharyngeal(NP)  swabs  in vial transport medium  Result Value Ref Range Status   SARS Coronavirus 2 by RT PCR NEGATIVE NEGATIVE Final    Comment: (NOTE) SARS-CoV-2 target nucleic acids are NOT DETECTED.  The SARS-CoV-2 RNA is generally detectable in upper respiratory specimens during the acute phase of infection. The lowest concentration of SARS-CoV-2 viral copies this assay can detect is 138 copies/mL. A negative result does not preclude SARS-Cov-2 infection and should not be used as the sole basis for treatment or other patient management decisions. A negative result may occur with  improper specimen collection/handling, submission of specimen other than nasopharyngeal swab, presence of viral mutation(s) within the areas targeted by this assay, and inadequate number of viral copies(<138 copies/mL). A negative result must be combined with clinical observations, patient history, and epidemiological information. The expected result is Negative.  Fact Sheet for Patients:  EntrepreneurPulse.com.au  Fact Sheet for Healthcare Providers:  IncredibleEmployment.be  This test is no t yet approved or cleared by the [place] FDA and  has been authorized for detection and/or diagnosis of SARS-CoV-2 by FDA under an Emergency Use Authorization (EUA). This EUA will remain  in effect (meaning this test can be used) for the duration of the COVID-19 declaration under Section 564(b)(1) of the Act, 21 U.S.C.section 360bbb-3(b)(1), unless the authorization is terminated  or revoked sooner.       Influenza A by PCR NEGATIVE NEGATIVE Final   Influenza B by PCR NEGATIVE NEGATIVE Final    Comment: (NOTE) The Xpert Xpress SARS-CoV-2/FLU/RSV plus assay is intended as an aid in the diagnosis of influenza from Nasopharyngeal swab specimens and should not be used as a sole basis for treatment. Nasal washings and aspirates are unacceptable for Xpert Xpress  SARS-CoV-2/FLU/RSV testing.  Fact Sheet for Patients: EntrepreneurPulse.com.au  Fact Sheet for Healthcare Providers: IncredibleEmployment.be  This test is not yet approved or cleared by the [place] FDA and has been authorized for detection and/or diagnosis of SARS-CoV-2 by FDA under an Emergency Use Authorization (EUA). This EUA will remain in effect (meaning this test can be used) for the duration of the COVID-19 declaration under Section 564(b)(1) of the Act, 21 U.S.C. section 360bbb-3(b)(1), unless the authorization is terminated or revoked.  Performed at [facility], [address] [place], [place] [address]     Lab Basic Metabolic Panel: Recent Labs  Lab 10/28/2020 1102  NA 134*  K 4.9  CL 94*  CO2 27  GLUCOSE 138*  BUN 50*  CREATININE 1.33*  CALCIUM 9.2   Liver Function Tests: Recent Labs  Lab 11/15/2020 1102  AST 54*  ALT 32  ALKPHOS 159*  BILITOT 1.5*  PROT 5.6*  ALBUMIN 2.8*   Recent Labs  Lab 10/25/2020 1102  LIPASE 22   No results for input(s): AMMONIA in the last 168 hours. CBC: Recent Labs  Lab 10/22/2020 1102  WBC 49.0*  NEUTROABS 39.8*  HGB 11.2*  HCT 35.7*  MCV 83.6  PLT 1,505*   Cardiac Enzymes: No results for input(s): CKTOTAL, CKMB, CKMBINDEX, TROPONINI in the last 168 hours. Sepsis Labs: Recent Labs  Lab 11/04/2020 1102  PROCALCITON 0.20  WBC 49.0*    Procedures/Operations  None   Taye T Gonfa 11/08/2020, 3:50 PM

## 2020-11-17 NOTE — Progress Notes (Signed)
  Chaplain On-Call responded to Order Requisition reading "End of Life".  Chaplain arrived on Unit and spoke with RN Velna Hatchet, who stated that the family has declined Chaplain services.  Chaplain stated that Chaplains are available if the family requests support at another time.  Chaplain Pollyann Samples M.Div., [facility]

## 2020-11-17 DEATH — deceased
